# Patient Record
Sex: Male | Born: 1970 | Race: White | Hispanic: No | Marital: Single | Smoking: Never smoker
Health system: Southern US, Community
[De-identification: ages and names within clinical notes are randomized; demographics above are authoritative.]

## PROBLEM LIST (undated history)

## (undated) DIAGNOSIS — G8929 Other chronic pain: Secondary | ICD-10-CM

## (undated) DIAGNOSIS — R519 Other chronic pain: Secondary | ICD-10-CM

## (undated) DIAGNOSIS — G4733 Obstructive sleep apnea (adult) (pediatric): Secondary | ICD-10-CM

## (undated) DIAGNOSIS — A048 Other specified bacterial intestinal infections: Secondary | ICD-10-CM

## (undated) DIAGNOSIS — F79 Unspecified intellectual disabilities: Secondary | ICD-10-CM

## (undated) DIAGNOSIS — G5602 Carpal tunnel syndrome, left upper limb: Secondary | ICD-10-CM

## (undated) DIAGNOSIS — R51 Headache: Secondary | ICD-10-CM

## (undated) DIAGNOSIS — T148XXA Other injury of unspecified body region, initial encounter: Secondary | ICD-10-CM

## (undated) DIAGNOSIS — I10 Essential (primary) hypertension: Secondary | ICD-10-CM

## (undated) HISTORY — DX: Other chronic pain: R51.9

## (undated) HISTORY — PX: CARPAL TUNNEL RELEASE: SHX101

## (undated) HISTORY — DX: Other specified bacterial intestinal infections: A04.8

## (undated) HISTORY — DX: Obstructive sleep apnea (adult) (pediatric): G47.33

## (undated) HISTORY — DX: Carpal tunnel syndrome, left upper limb: G56.02

## (undated) HISTORY — DX: Essential (primary) hypertension: I10

## (undated) HISTORY — DX: Unspecified intellectual disabilities: F79

## (undated) HISTORY — DX: Other chronic pain: G89.29

## (undated) HISTORY — DX: Headache: R51

## (undated) HISTORY — DX: Other injury of unspecified body region, initial encounter: T14.8XXA

---

## 1997-09-30 ENCOUNTER — Emergency Department (HOSPITAL_COMMUNITY): Admission: EM | Admit: 1997-09-30 | Discharge: 1997-09-30 | Payer: Self-pay | Admitting: Emergency Medicine

## 1997-11-22 ENCOUNTER — Encounter: Admission: RE | Admit: 1997-11-22 | Discharge: 1997-11-22 | Payer: Self-pay | Admitting: Family Medicine

## 1998-01-03 ENCOUNTER — Encounter: Admission: RE | Admit: 1998-01-03 | Discharge: 1998-01-03 | Payer: Self-pay | Admitting: Family Medicine

## 1998-02-14 ENCOUNTER — Encounter: Admission: RE | Admit: 1998-02-14 | Discharge: 1998-02-14 | Payer: Self-pay | Admitting: Family Medicine

## 1998-02-28 ENCOUNTER — Encounter: Admission: RE | Admit: 1998-02-28 | Discharge: 1998-02-28 | Payer: Self-pay | Admitting: Family Medicine

## 1998-08-22 ENCOUNTER — Encounter: Admission: RE | Admit: 1998-08-22 | Discharge: 1998-08-22 | Payer: Self-pay | Admitting: Family Medicine

## 1998-09-11 ENCOUNTER — Emergency Department (HOSPITAL_COMMUNITY): Admission: EM | Admit: 1998-09-11 | Discharge: 1998-09-11 | Payer: Self-pay | Admitting: Emergency Medicine

## 1998-09-11 ENCOUNTER — Encounter: Payer: Self-pay | Admitting: Emergency Medicine

## 1998-09-12 ENCOUNTER — Emergency Department (HOSPITAL_COMMUNITY): Admission: EM | Admit: 1998-09-12 | Discharge: 1998-09-12 | Payer: Self-pay | Admitting: Emergency Medicine

## 1998-09-21 ENCOUNTER — Encounter: Admission: RE | Admit: 1998-09-21 | Discharge: 1998-09-21 | Payer: Self-pay | Admitting: Family Medicine

## 1998-10-05 ENCOUNTER — Encounter: Admission: RE | Admit: 1998-10-05 | Discharge: 1998-10-05 | Payer: Self-pay | Admitting: Family Medicine

## 1998-10-15 ENCOUNTER — Encounter: Admission: RE | Admit: 1998-10-15 | Discharge: 1998-10-15 | Payer: Self-pay | Admitting: Family Medicine

## 1998-10-25 ENCOUNTER — Encounter: Admission: RE | Admit: 1998-10-25 | Discharge: 1998-10-25 | Payer: Self-pay | Admitting: Family Medicine

## 1998-11-08 ENCOUNTER — Encounter: Admission: RE | Admit: 1998-11-08 | Discharge: 1998-11-08 | Payer: Self-pay | Admitting: Family Medicine

## 1998-12-10 ENCOUNTER — Encounter: Admission: RE | Admit: 1998-12-10 | Discharge: 1998-12-10 | Payer: Self-pay | Admitting: Family Medicine

## 1999-02-01 ENCOUNTER — Encounter: Admission: RE | Admit: 1999-02-01 | Discharge: 1999-02-01 | Payer: Self-pay | Admitting: Family Medicine

## 1999-02-05 ENCOUNTER — Encounter: Admission: RE | Admit: 1999-02-05 | Discharge: 1999-02-05 | Payer: Self-pay | Admitting: Sports Medicine

## 1999-02-21 ENCOUNTER — Encounter: Admission: RE | Admit: 1999-02-21 | Discharge: 1999-02-21 | Payer: Self-pay | Admitting: Family Medicine

## 1999-03-22 ENCOUNTER — Emergency Department (HOSPITAL_COMMUNITY): Admission: EM | Admit: 1999-03-22 | Discharge: 1999-03-22 | Payer: Self-pay | Admitting: Emergency Medicine

## 1999-09-19 ENCOUNTER — Encounter: Admission: RE | Admit: 1999-09-19 | Discharge: 1999-09-19 | Payer: Self-pay | Admitting: Family Medicine

## 1999-09-25 ENCOUNTER — Encounter: Admission: RE | Admit: 1999-09-25 | Discharge: 1999-09-25 | Payer: Self-pay | Admitting: Family Medicine

## 1999-10-09 ENCOUNTER — Encounter: Admission: RE | Admit: 1999-10-09 | Discharge: 1999-10-09 | Payer: Self-pay | Admitting: Family Medicine

## 2000-01-05 ENCOUNTER — Emergency Department (HOSPITAL_COMMUNITY): Admission: EM | Admit: 2000-01-05 | Discharge: 2000-01-06 | Payer: Self-pay | Admitting: Emergency Medicine

## 2000-01-05 ENCOUNTER — Encounter: Payer: Self-pay | Admitting: Emergency Medicine

## 2000-02-18 HISTORY — PX: APPENDECTOMY: SHX54

## 2000-05-12 ENCOUNTER — Encounter: Admission: RE | Admit: 2000-05-12 | Discharge: 2000-05-12 | Payer: Self-pay | Admitting: Sports Medicine

## 2000-05-30 ENCOUNTER — Emergency Department (HOSPITAL_COMMUNITY): Admission: EM | Admit: 2000-05-30 | Discharge: 2000-05-30 | Payer: Self-pay | Admitting: *Deleted

## 2000-06-11 ENCOUNTER — Emergency Department (HOSPITAL_COMMUNITY): Admission: EM | Admit: 2000-06-11 | Discharge: 2000-06-11 | Payer: Self-pay | Admitting: Emergency Medicine

## 2000-06-11 ENCOUNTER — Encounter: Payer: Self-pay | Admitting: *Deleted

## 2000-06-12 ENCOUNTER — Emergency Department (HOSPITAL_COMMUNITY): Admission: EM | Admit: 2000-06-12 | Discharge: 2000-06-12 | Payer: Self-pay | Admitting: Internal Medicine

## 2000-06-15 ENCOUNTER — Encounter: Admission: RE | Admit: 2000-06-15 | Discharge: 2000-06-15 | Payer: Self-pay | Admitting: Family Medicine

## 2000-06-24 ENCOUNTER — Encounter: Admission: RE | Admit: 2000-06-24 | Discharge: 2000-06-24 | Payer: Self-pay | Admitting: Family Medicine

## 2000-07-24 ENCOUNTER — Encounter: Admission: RE | Admit: 2000-07-24 | Discharge: 2000-07-24 | Payer: Self-pay | Admitting: Family Medicine

## 2000-08-26 ENCOUNTER — Emergency Department (HOSPITAL_COMMUNITY): Admission: EM | Admit: 2000-08-26 | Discharge: 2000-08-26 | Payer: Self-pay

## 2000-10-12 ENCOUNTER — Emergency Department (HOSPITAL_COMMUNITY): Admission: EM | Admit: 2000-10-12 | Discharge: 2000-10-12 | Payer: Self-pay | Admitting: Emergency Medicine

## 2000-10-12 ENCOUNTER — Encounter: Payer: Self-pay | Admitting: Emergency Medicine

## 2000-10-13 ENCOUNTER — Emergency Department (HOSPITAL_COMMUNITY): Admission: EM | Admit: 2000-10-13 | Discharge: 2000-10-13 | Payer: Self-pay | Admitting: Emergency Medicine

## 2000-10-15 ENCOUNTER — Encounter: Payer: Self-pay | Admitting: *Deleted

## 2000-10-15 ENCOUNTER — Encounter: Admission: RE | Admit: 2000-10-15 | Discharge: 2000-10-15 | Payer: Self-pay | Admitting: *Deleted

## 2000-10-15 ENCOUNTER — Encounter: Admission: RE | Admit: 2000-10-15 | Discharge: 2000-10-15 | Payer: Self-pay | Admitting: Sports Medicine

## 2000-10-22 ENCOUNTER — Encounter: Admission: RE | Admit: 2000-10-22 | Discharge: 2000-10-22 | Payer: Self-pay | Admitting: Family Medicine

## 2000-11-05 ENCOUNTER — Encounter: Admission: RE | Admit: 2000-11-05 | Discharge: 2000-11-05 | Payer: Self-pay | Admitting: Family Medicine

## 2000-11-10 ENCOUNTER — Encounter: Payer: Self-pay | Admitting: *Deleted

## 2000-11-10 ENCOUNTER — Encounter: Admission: RE | Admit: 2000-11-10 | Discharge: 2000-11-10 | Payer: Self-pay | Admitting: *Deleted

## 2000-11-12 ENCOUNTER — Encounter: Admission: RE | Admit: 2000-11-12 | Discharge: 2000-11-12 | Payer: Self-pay | Admitting: Sports Medicine

## 2000-11-26 ENCOUNTER — Encounter: Admission: RE | Admit: 2000-11-26 | Discharge: 2000-11-26 | Payer: Self-pay | Admitting: Family Medicine

## 2001-01-19 ENCOUNTER — Emergency Department (HOSPITAL_COMMUNITY): Admission: EM | Admit: 2001-01-19 | Discharge: 2001-01-19 | Payer: Self-pay | Admitting: Emergency Medicine

## 2001-02-17 HISTORY — PX: CERVICAL DISC SURGERY: SHX588

## 2001-02-17 HISTORY — PX: ESOPHAGOGASTRODUODENOSCOPY: SHX1529

## 2001-03-08 ENCOUNTER — Emergency Department (HOSPITAL_COMMUNITY): Admission: EM | Admit: 2001-03-08 | Discharge: 2001-03-08 | Payer: Self-pay | Admitting: *Deleted

## 2001-04-01 ENCOUNTER — Encounter: Admission: RE | Admit: 2001-04-01 | Discharge: 2001-04-14 | Payer: Self-pay | Admitting: Sports Medicine

## 2001-04-29 ENCOUNTER — Emergency Department (HOSPITAL_COMMUNITY): Admission: EM | Admit: 2001-04-29 | Discharge: 2001-04-29 | Payer: Self-pay | Admitting: Emergency Medicine

## 2001-04-29 ENCOUNTER — Encounter: Payer: Self-pay | Admitting: Emergency Medicine

## 2001-05-11 ENCOUNTER — Emergency Department (HOSPITAL_COMMUNITY): Admission: EM | Admit: 2001-05-11 | Discharge: 2001-05-12 | Payer: Self-pay | Admitting: Emergency Medicine

## 2001-05-20 ENCOUNTER — Emergency Department (HOSPITAL_COMMUNITY): Admission: EM | Admit: 2001-05-20 | Discharge: 2001-05-21 | Payer: Self-pay | Admitting: *Deleted

## 2001-06-15 ENCOUNTER — Encounter: Admission: RE | Admit: 2001-06-15 | Discharge: 2001-06-15 | Payer: Self-pay | Admitting: Family Medicine

## 2001-06-17 ENCOUNTER — Emergency Department (HOSPITAL_COMMUNITY): Admission: EM | Admit: 2001-06-17 | Discharge: 2001-06-17 | Payer: Self-pay | Admitting: Emergency Medicine

## 2001-06-21 ENCOUNTER — Encounter: Admission: RE | Admit: 2001-06-21 | Discharge: 2001-06-21 | Payer: Self-pay | Admitting: Family Medicine

## 2001-06-30 ENCOUNTER — Encounter: Admission: RE | Admit: 2001-06-30 | Discharge: 2001-06-30 | Payer: Self-pay | Admitting: Family Medicine

## 2001-09-09 ENCOUNTER — Encounter: Payer: Self-pay | Admitting: Neurosurgery

## 2001-09-14 ENCOUNTER — Inpatient Hospital Stay (HOSPITAL_COMMUNITY): Admission: RE | Admit: 2001-09-14 | Discharge: 2001-09-16 | Payer: Self-pay | Admitting: Neurosurgery

## 2001-09-14 ENCOUNTER — Encounter: Payer: Self-pay | Admitting: Neurosurgery

## 2001-10-07 ENCOUNTER — Emergency Department (HOSPITAL_COMMUNITY): Admission: EM | Admit: 2001-10-07 | Discharge: 2001-10-07 | Payer: Self-pay | Admitting: Emergency Medicine

## 2001-10-18 DIAGNOSIS — A048 Other specified bacterial intestinal infections: Secondary | ICD-10-CM

## 2001-10-18 HISTORY — DX: Other specified bacterial intestinal infections: A04.8

## 2001-10-25 ENCOUNTER — Encounter: Payer: Self-pay | Admitting: Neurosurgery

## 2001-10-25 ENCOUNTER — Encounter: Admission: RE | Admit: 2001-10-25 | Discharge: 2001-10-25 | Payer: Self-pay | Admitting: Neurosurgery

## 2001-11-03 ENCOUNTER — Encounter: Admission: RE | Admit: 2001-11-03 | Discharge: 2001-11-03 | Payer: Self-pay | Admitting: Family Medicine

## 2001-12-24 ENCOUNTER — Encounter: Payer: Self-pay | Admitting: Neurosurgery

## 2001-12-24 ENCOUNTER — Encounter: Admission: RE | Admit: 2001-12-24 | Discharge: 2001-12-24 | Payer: Self-pay | Admitting: Neurosurgery

## 2002-02-15 ENCOUNTER — Emergency Department (HOSPITAL_COMMUNITY): Admission: EM | Admit: 2002-02-15 | Discharge: 2002-02-15 | Payer: Self-pay | Admitting: *Deleted

## 2002-04-14 ENCOUNTER — Emergency Department (HOSPITAL_COMMUNITY): Admission: EM | Admit: 2002-04-14 | Discharge: 2002-04-14 | Payer: Self-pay | Admitting: Emergency Medicine

## 2002-04-14 ENCOUNTER — Encounter: Payer: Self-pay | Admitting: Emergency Medicine

## 2002-07-06 ENCOUNTER — Encounter: Admission: RE | Admit: 2002-07-06 | Discharge: 2002-07-06 | Payer: Self-pay | Admitting: Family Medicine

## 2002-07-13 ENCOUNTER — Encounter: Admission: RE | Admit: 2002-07-13 | Discharge: 2002-07-13 | Payer: Self-pay | Admitting: Family Medicine

## 2002-11-07 ENCOUNTER — Emergency Department (HOSPITAL_COMMUNITY): Admission: EM | Admit: 2002-11-07 | Discharge: 2002-11-07 | Payer: Self-pay | Admitting: Emergency Medicine

## 2002-11-14 ENCOUNTER — Ambulatory Visit (HOSPITAL_COMMUNITY): Admission: RE | Admit: 2002-11-14 | Discharge: 2002-11-14 | Payer: Self-pay | Admitting: Sports Medicine

## 2002-11-14 ENCOUNTER — Encounter: Payer: Self-pay | Admitting: Sports Medicine

## 2002-11-29 ENCOUNTER — Encounter: Admission: RE | Admit: 2002-11-29 | Discharge: 2002-11-29 | Payer: Self-pay | Admitting: Family Medicine

## 2002-12-09 ENCOUNTER — Encounter: Admission: RE | Admit: 2002-12-09 | Discharge: 2002-12-09 | Payer: Self-pay | Admitting: Family Medicine

## 2002-12-19 HISTORY — PX: LUMBAR LAMINECTOMY: SHX95

## 2003-01-16 ENCOUNTER — Inpatient Hospital Stay (HOSPITAL_COMMUNITY): Admission: RE | Admit: 2003-01-16 | Discharge: 2003-01-20 | Payer: Self-pay | Admitting: Neurosurgery

## 2003-02-16 ENCOUNTER — Encounter: Admission: RE | Admit: 2003-02-16 | Discharge: 2003-03-09 | Payer: Self-pay | Admitting: Neurosurgery

## 2003-03-16 ENCOUNTER — Encounter: Admission: RE | Admit: 2003-03-16 | Discharge: 2003-03-16 | Payer: Self-pay | Admitting: Neurosurgery

## 2003-04-21 ENCOUNTER — Encounter: Admission: RE | Admit: 2003-04-21 | Discharge: 2003-04-21 | Payer: Self-pay | Admitting: Family Medicine

## 2003-05-25 ENCOUNTER — Encounter: Admission: RE | Admit: 2003-05-25 | Discharge: 2003-05-25 | Payer: Self-pay | Admitting: Neurosurgery

## 2003-05-31 ENCOUNTER — Encounter: Admission: RE | Admit: 2003-05-31 | Discharge: 2003-05-31 | Payer: Self-pay | Admitting: Family Medicine

## 2003-06-28 ENCOUNTER — Ambulatory Visit (HOSPITAL_COMMUNITY): Admission: RE | Admit: 2003-06-28 | Discharge: 2003-06-28 | Payer: Self-pay | Admitting: Family Medicine

## 2003-06-28 ENCOUNTER — Encounter: Admission: RE | Admit: 2003-06-28 | Discharge: 2003-06-28 | Payer: Self-pay | Admitting: Family Medicine

## 2003-06-30 ENCOUNTER — Emergency Department (HOSPITAL_COMMUNITY): Admission: EM | Admit: 2003-06-30 | Discharge: 2003-06-30 | Payer: Self-pay | Admitting: Emergency Medicine

## 2003-07-25 ENCOUNTER — Encounter: Admission: RE | Admit: 2003-07-25 | Discharge: 2003-07-25 | Payer: Self-pay | Admitting: Neurosurgery

## 2003-07-29 ENCOUNTER — Ambulatory Visit (HOSPITAL_COMMUNITY): Admission: RE | Admit: 2003-07-29 | Discharge: 2003-07-29 | Payer: Self-pay | Admitting: Neurosurgery

## 2003-08-14 ENCOUNTER — Encounter: Admission: RE | Admit: 2003-08-14 | Discharge: 2003-11-12 | Payer: Self-pay | Admitting: Neurosurgery

## 2003-10-12 ENCOUNTER — Encounter: Admission: RE | Admit: 2003-10-12 | Discharge: 2003-10-12 | Payer: Self-pay | Admitting: Neurosurgery

## 2003-10-13 ENCOUNTER — Encounter: Admission: RE | Admit: 2003-10-13 | Discharge: 2003-10-13 | Payer: Self-pay | Admitting: Neurosurgery

## 2003-10-20 ENCOUNTER — Ambulatory Visit: Payer: Self-pay | Admitting: Family Medicine

## 2004-01-04 ENCOUNTER — Ambulatory Visit: Payer: Self-pay | Admitting: Family Medicine

## 2004-01-09 ENCOUNTER — Ambulatory Visit: Payer: Self-pay | Admitting: Family Medicine

## 2004-01-09 ENCOUNTER — Encounter: Admission: RE | Admit: 2004-01-09 | Discharge: 2004-01-09 | Payer: Self-pay | Admitting: Sports Medicine

## 2004-01-10 ENCOUNTER — Ambulatory Visit: Payer: Self-pay | Admitting: Family Medicine

## 2004-01-22 ENCOUNTER — Ambulatory Visit: Payer: Self-pay | Admitting: Family Medicine

## 2004-01-29 ENCOUNTER — Ambulatory Visit: Payer: Self-pay | Admitting: Family Medicine

## 2004-02-02 ENCOUNTER — Ambulatory Visit: Payer: Self-pay | Admitting: Family Medicine

## 2004-02-20 ENCOUNTER — Ambulatory Visit: Payer: Self-pay | Admitting: Family Medicine

## 2004-02-28 ENCOUNTER — Emergency Department (HOSPITAL_COMMUNITY): Admission: EM | Admit: 2004-02-28 | Discharge: 2004-02-28 | Payer: Self-pay | Admitting: Emergency Medicine

## 2004-04-11 ENCOUNTER — Emergency Department (HOSPITAL_COMMUNITY): Admission: EM | Admit: 2004-04-11 | Discharge: 2004-04-11 | Payer: Self-pay | Admitting: *Deleted

## 2004-04-12 ENCOUNTER — Inpatient Hospital Stay (HOSPITAL_COMMUNITY): Admission: EM | Admit: 2004-04-12 | Discharge: 2004-04-14 | Payer: Self-pay | Admitting: Emergency Medicine

## 2004-04-12 ENCOUNTER — Ambulatory Visit: Payer: Self-pay | Admitting: Family Medicine

## 2004-04-18 ENCOUNTER — Ambulatory Visit: Payer: Self-pay | Admitting: Family Medicine

## 2004-05-14 ENCOUNTER — Encounter: Admission: RE | Admit: 2004-05-14 | Discharge: 2004-05-14 | Payer: Self-pay | Admitting: Neurosurgery

## 2004-05-14 ENCOUNTER — Ambulatory Visit (HOSPITAL_COMMUNITY): Admission: RE | Admit: 2004-05-14 | Discharge: 2004-05-14 | Payer: Self-pay | Admitting: Sports Medicine

## 2004-05-15 ENCOUNTER — Encounter: Admission: RE | Admit: 2004-05-15 | Discharge: 2004-05-15 | Payer: Self-pay | Admitting: Neurosurgery

## 2004-06-20 ENCOUNTER — Ambulatory Visit: Payer: Self-pay | Admitting: Family Medicine

## 2004-07-01 ENCOUNTER — Ambulatory Visit: Payer: Self-pay | Admitting: Family Medicine

## 2004-07-05 ENCOUNTER — Ambulatory Visit: Payer: Self-pay | Admitting: Family Medicine

## 2004-07-08 ENCOUNTER — Ambulatory Visit: Payer: Self-pay | Admitting: Family Medicine

## 2004-07-10 ENCOUNTER — Ambulatory Visit: Payer: Self-pay | Admitting: Family Medicine

## 2004-07-17 ENCOUNTER — Ambulatory Visit: Payer: Self-pay | Admitting: Family Medicine

## 2004-08-23 ENCOUNTER — Ambulatory Visit: Payer: Self-pay | Admitting: Family Medicine

## 2004-09-12 ENCOUNTER — Encounter: Admission: RE | Admit: 2004-09-12 | Discharge: 2004-09-12 | Payer: Self-pay | Admitting: Neurosurgery

## 2004-09-25 ENCOUNTER — Ambulatory Visit: Payer: Self-pay | Admitting: Family Medicine

## 2004-10-16 ENCOUNTER — Ambulatory Visit: Payer: Self-pay | Admitting: Family Medicine

## 2004-10-24 ENCOUNTER — Ambulatory Visit: Payer: Self-pay | Admitting: Family Medicine

## 2005-05-14 ENCOUNTER — Emergency Department (HOSPITAL_COMMUNITY): Admission: EM | Admit: 2005-05-14 | Discharge: 2005-05-14 | Payer: Self-pay | Admitting: Emergency Medicine

## 2005-05-17 ENCOUNTER — Encounter: Admission: RE | Admit: 2005-05-17 | Discharge: 2005-05-17 | Payer: Self-pay | Admitting: Otolaryngology

## 2005-05-23 ENCOUNTER — Ambulatory Visit: Payer: Self-pay | Admitting: Family Medicine

## 2005-05-31 ENCOUNTER — Emergency Department (HOSPITAL_COMMUNITY): Admission: EM | Admit: 2005-05-31 | Discharge: 2005-05-31 | Payer: Self-pay | Admitting: Emergency Medicine

## 2005-06-18 ENCOUNTER — Ambulatory Visit: Payer: Self-pay | Admitting: Family Medicine

## 2005-08-22 ENCOUNTER — Ambulatory Visit: Payer: Self-pay | Admitting: Family Medicine

## 2005-08-29 ENCOUNTER — Ambulatory Visit: Payer: Self-pay | Admitting: Family Medicine

## 2005-09-19 ENCOUNTER — Ambulatory Visit: Payer: Self-pay | Admitting: Sports Medicine

## 2005-10-01 ENCOUNTER — Ambulatory Visit: Payer: Self-pay | Admitting: Family Medicine

## 2005-10-25 ENCOUNTER — Encounter: Admission: RE | Admit: 2005-10-25 | Discharge: 2005-10-25 | Payer: Self-pay | Admitting: Neurosurgery

## 2005-11-06 ENCOUNTER — Ambulatory Visit: Payer: Self-pay | Admitting: Sports Medicine

## 2005-11-07 ENCOUNTER — Emergency Department (HOSPITAL_COMMUNITY): Admission: EM | Admit: 2005-11-07 | Discharge: 2005-11-07 | Payer: Self-pay | Admitting: Emergency Medicine

## 2005-12-02 ENCOUNTER — Ambulatory Visit: Payer: Self-pay | Admitting: Family Medicine

## 2005-12-16 ENCOUNTER — Ambulatory Visit: Payer: Self-pay | Admitting: Family Medicine

## 2006-01-21 ENCOUNTER — Ambulatory Visit: Payer: Self-pay | Admitting: Family Medicine

## 2006-02-11 ENCOUNTER — Encounter
Admission: RE | Admit: 2006-02-11 | Discharge: 2006-02-11 | Payer: Self-pay | Admitting: Physical Medicine and Rehabilitation

## 2006-03-06 ENCOUNTER — Ambulatory Visit: Payer: Self-pay | Admitting: Family Medicine

## 2006-03-26 ENCOUNTER — Ambulatory Visit: Payer: Self-pay | Admitting: Family Medicine

## 2006-03-26 ENCOUNTER — Encounter (INDEPENDENT_AMBULATORY_CARE_PROVIDER_SITE_OTHER): Payer: Self-pay | Admitting: Family Medicine

## 2006-03-26 LAB — CONVERTED CEMR LAB
Calcium: 9.9 mg/dL (ref 8.4–10.5)
Potassium: 4.2 meq/L (ref 3.5–5.3)

## 2006-03-27 ENCOUNTER — Ambulatory Visit (HOSPITAL_COMMUNITY): Admission: RE | Admit: 2006-03-27 | Discharge: 2006-03-27 | Payer: Self-pay | Admitting: Neurosurgery

## 2006-04-04 ENCOUNTER — Emergency Department (HOSPITAL_COMMUNITY): Admission: EM | Admit: 2006-04-04 | Discharge: 2006-04-04 | Payer: Self-pay | Admitting: Emergency Medicine

## 2006-04-06 ENCOUNTER — Ambulatory Visit: Payer: Self-pay | Admitting: Family Medicine

## 2006-04-16 DIAGNOSIS — L408 Other psoriasis: Secondary | ICD-10-CM | POA: Insufficient documentation

## 2006-04-16 DIAGNOSIS — F79 Unspecified intellectual disabilities: Secondary | ICD-10-CM | POA: Insufficient documentation

## 2006-04-16 DIAGNOSIS — I10 Essential (primary) hypertension: Secondary | ICD-10-CM | POA: Insufficient documentation

## 2006-04-16 DIAGNOSIS — J309 Allergic rhinitis, unspecified: Secondary | ICD-10-CM | POA: Insufficient documentation

## 2006-04-16 DIAGNOSIS — G43909 Migraine, unspecified, not intractable, without status migrainosus: Secondary | ICD-10-CM | POA: Insufficient documentation

## 2006-05-18 ENCOUNTER — Telehealth (INDEPENDENT_AMBULATORY_CARE_PROVIDER_SITE_OTHER): Payer: Self-pay | Admitting: *Deleted

## 2006-05-19 ENCOUNTER — Telehealth: Payer: Self-pay | Admitting: *Deleted

## 2006-05-20 ENCOUNTER — Ambulatory Visit: Payer: Self-pay | Admitting: Family Medicine

## 2006-05-20 ENCOUNTER — Ambulatory Visit (HOSPITAL_COMMUNITY): Admission: RE | Admit: 2006-05-20 | Discharge: 2006-05-20 | Payer: Self-pay | Admitting: Family Medicine

## 2006-05-26 ENCOUNTER — Telehealth: Payer: Self-pay | Admitting: *Deleted

## 2006-05-28 ENCOUNTER — Telehealth (INDEPENDENT_AMBULATORY_CARE_PROVIDER_SITE_OTHER): Payer: Self-pay | Admitting: *Deleted

## 2006-07-07 ENCOUNTER — Telehealth: Payer: Self-pay | Admitting: *Deleted

## 2006-07-17 ENCOUNTER — Encounter: Payer: Self-pay | Admitting: *Deleted

## 2006-07-20 ENCOUNTER — Telehealth: Payer: Self-pay | Admitting: *Deleted

## 2006-07-23 ENCOUNTER — Encounter (INDEPENDENT_AMBULATORY_CARE_PROVIDER_SITE_OTHER): Payer: Self-pay | Admitting: Family Medicine

## 2006-07-23 ENCOUNTER — Ambulatory Visit: Payer: Self-pay | Admitting: Family Medicine

## 2006-07-23 DIAGNOSIS — H00019 Hordeolum externum unspecified eye, unspecified eyelid: Secondary | ICD-10-CM | POA: Insufficient documentation

## 2006-08-12 ENCOUNTER — Ambulatory Visit: Payer: Self-pay | Admitting: Family Medicine

## 2006-08-12 DIAGNOSIS — R42 Dizziness and giddiness: Secondary | ICD-10-CM | POA: Insufficient documentation

## 2006-09-01 ENCOUNTER — Telehealth: Payer: Self-pay | Admitting: *Deleted

## 2006-09-02 ENCOUNTER — Ambulatory Visit: Payer: Self-pay | Admitting: Family Medicine

## 2006-09-02 DIAGNOSIS — N508 Other specified disorders of male genital organs: Secondary | ICD-10-CM | POA: Insufficient documentation

## 2006-09-09 ENCOUNTER — Telehealth (INDEPENDENT_AMBULATORY_CARE_PROVIDER_SITE_OTHER): Payer: Self-pay | Admitting: Family Medicine

## 2006-09-09 ENCOUNTER — Ambulatory Visit: Payer: Self-pay | Admitting: Family Medicine

## 2006-09-10 ENCOUNTER — Telehealth: Payer: Self-pay | Admitting: *Deleted

## 2006-09-14 ENCOUNTER — Telehealth: Payer: Self-pay | Admitting: *Deleted

## 2006-10-01 ENCOUNTER — Telehealth: Payer: Self-pay | Admitting: Family Medicine

## 2006-10-26 ENCOUNTER — Ambulatory Visit: Payer: Self-pay | Admitting: Family Medicine

## 2006-11-03 ENCOUNTER — Ambulatory Visit: Payer: Self-pay | Admitting: Family Medicine

## 2006-11-03 ENCOUNTER — Encounter (INDEPENDENT_AMBULATORY_CARE_PROVIDER_SITE_OTHER): Payer: Self-pay | Admitting: Family Medicine

## 2006-11-03 LAB — CONVERTED CEMR LAB
BUN: 9 mg/dL (ref 6–23)
Calcium: 9.1 mg/dL (ref 8.4–10.5)
Chloride: 104 meq/L (ref 96–112)
Glucose, Bld: 87 mg/dL (ref 70–99)

## 2006-11-05 ENCOUNTER — Encounter (INDEPENDENT_AMBULATORY_CARE_PROVIDER_SITE_OTHER): Payer: Self-pay | Admitting: Family Medicine

## 2006-11-05 ENCOUNTER — Encounter: Admission: RE | Admit: 2006-11-05 | Discharge: 2006-11-05 | Payer: Self-pay | Admitting: Neurosurgery

## 2006-11-13 ENCOUNTER — Telehealth (INDEPENDENT_AMBULATORY_CARE_PROVIDER_SITE_OTHER): Payer: Self-pay | Admitting: Family Medicine

## 2006-11-13 ENCOUNTER — Telehealth: Payer: Self-pay | Admitting: *Deleted

## 2006-11-17 ENCOUNTER — Telehealth: Payer: Self-pay | Admitting: *Deleted

## 2006-11-30 ENCOUNTER — Telehealth: Payer: Self-pay | Admitting: *Deleted

## 2006-12-15 ENCOUNTER — Encounter: Admission: RE | Admit: 2006-12-15 | Discharge: 2006-12-15 | Payer: Self-pay | Admitting: Neurosurgery

## 2006-12-23 ENCOUNTER — Encounter (INDEPENDENT_AMBULATORY_CARE_PROVIDER_SITE_OTHER): Payer: Self-pay | Admitting: *Deleted

## 2007-02-15 ENCOUNTER — Encounter (INDEPENDENT_AMBULATORY_CARE_PROVIDER_SITE_OTHER): Payer: Self-pay | Admitting: Family Medicine

## 2007-02-25 ENCOUNTER — Inpatient Hospital Stay (HOSPITAL_COMMUNITY): Admission: EM | Admit: 2007-02-25 | Discharge: 2007-02-28 | Payer: Self-pay | Admitting: Emergency Medicine

## 2007-02-25 ENCOUNTER — Ambulatory Visit: Payer: Self-pay | Admitting: Family Medicine

## 2007-02-26 LAB — CONVERTED CEMR LAB: HDL: 28 mg/dL

## 2007-02-27 ENCOUNTER — Encounter: Payer: Self-pay | Admitting: Family Medicine

## 2007-02-28 ENCOUNTER — Encounter: Payer: Self-pay | Admitting: *Deleted

## 2007-03-01 ENCOUNTER — Telehealth: Payer: Self-pay | Admitting: Family Medicine

## 2007-03-01 ENCOUNTER — Telehealth: Payer: Self-pay | Admitting: *Deleted

## 2007-03-02 ENCOUNTER — Telehealth (INDEPENDENT_AMBULATORY_CARE_PROVIDER_SITE_OTHER): Payer: Self-pay | Admitting: Family Medicine

## 2007-03-03 ENCOUNTER — Telehealth: Payer: Self-pay | Admitting: *Deleted

## 2007-03-03 ENCOUNTER — Telehealth (INDEPENDENT_AMBULATORY_CARE_PROVIDER_SITE_OTHER): Payer: Self-pay | Admitting: Family Medicine

## 2007-03-03 ENCOUNTER — Emergency Department (HOSPITAL_COMMUNITY): Admission: EM | Admit: 2007-03-03 | Discharge: 2007-03-03 | Payer: Self-pay | Admitting: Emergency Medicine

## 2007-03-10 ENCOUNTER — Ambulatory Visit: Payer: Self-pay | Admitting: Cardiology

## 2007-03-11 ENCOUNTER — Ambulatory Visit: Payer: Self-pay | Admitting: Family Medicine

## 2007-03-22 ENCOUNTER — Encounter (INDEPENDENT_AMBULATORY_CARE_PROVIDER_SITE_OTHER): Payer: Self-pay | Admitting: Family Medicine

## 2007-03-22 ENCOUNTER — Ambulatory Visit: Payer: Self-pay

## 2007-03-23 ENCOUNTER — Ambulatory Visit: Payer: Self-pay

## 2007-03-31 ENCOUNTER — Ambulatory Visit: Payer: Self-pay | Admitting: Cardiology

## 2007-03-31 LAB — CONVERTED CEMR LAB
Basophils Relative: 3.8 % — ABNORMAL HIGH (ref 0.0–1.0)
Chloride: 103 meq/L (ref 96–112)
Creatinine, Ser: 0.9 mg/dL (ref 0.4–1.5)
Eosinophils Absolute: 0.2 10*3/uL (ref 0.0–0.6)
Eosinophils Relative: 1.9 % (ref 0.0–5.0)
GFR calc Af Amer: 123 mL/min
GFR calc non Af Amer: 101 mL/min
HCT: 43.3 % (ref 39.0–52.0)
INR: 0.9 (ref 0.8–1.0)
Lymphocytes Relative: 20 % (ref 12.0–46.0)
MCHC: 33.9 g/dL (ref 30.0–36.0)
MCV: 94.2 fL (ref 78.0–100.0)
Prothrombin Time: 11.3 s (ref 10.9–13.3)
RDW: 13.1 % (ref 11.5–14.6)
aPTT: 29.7 s (ref 21.7–29.8)

## 2007-04-05 ENCOUNTER — Telehealth (INDEPENDENT_AMBULATORY_CARE_PROVIDER_SITE_OTHER): Payer: Self-pay | Admitting: Family Medicine

## 2007-04-08 ENCOUNTER — Ambulatory Visit: Payer: Self-pay | Admitting: Cardiology

## 2007-04-08 ENCOUNTER — Inpatient Hospital Stay (HOSPITAL_BASED_OUTPATIENT_CLINIC_OR_DEPARTMENT_OTHER): Admission: RE | Admit: 2007-04-08 | Discharge: 2007-04-08 | Payer: Self-pay | Admitting: Cardiology

## 2007-04-15 ENCOUNTER — Ambulatory Visit: Payer: Self-pay | Admitting: Family Medicine

## 2007-04-21 ENCOUNTER — Ambulatory Visit: Payer: Self-pay | Admitting: Cardiology

## 2007-04-25 ENCOUNTER — Telehealth (INDEPENDENT_AMBULATORY_CARE_PROVIDER_SITE_OTHER): Payer: Self-pay | Admitting: Family Medicine

## 2007-04-26 ENCOUNTER — Telehealth: Payer: Self-pay | Admitting: *Deleted

## 2007-04-27 ENCOUNTER — Encounter: Payer: Self-pay | Admitting: *Deleted

## 2007-05-07 ENCOUNTER — Telehealth: Payer: Self-pay | Admitting: *Deleted

## 2007-05-17 ENCOUNTER — Telehealth (INDEPENDENT_AMBULATORY_CARE_PROVIDER_SITE_OTHER): Payer: Self-pay | Admitting: Family Medicine

## 2007-05-17 ENCOUNTER — Ambulatory Visit: Payer: Self-pay | Admitting: Cardiology

## 2007-05-23 ENCOUNTER — Telehealth (INDEPENDENT_AMBULATORY_CARE_PROVIDER_SITE_OTHER): Payer: Self-pay | Admitting: Family Medicine

## 2007-05-24 ENCOUNTER — Encounter (INDEPENDENT_AMBULATORY_CARE_PROVIDER_SITE_OTHER): Payer: Self-pay | Admitting: Family Medicine

## 2007-05-26 ENCOUNTER — Ambulatory Visit: Payer: Self-pay | Admitting: Family Medicine

## 2007-05-26 DIAGNOSIS — F341 Dysthymic disorder: Secondary | ICD-10-CM | POA: Insufficient documentation

## 2007-06-07 ENCOUNTER — Telehealth (INDEPENDENT_AMBULATORY_CARE_PROVIDER_SITE_OTHER): Payer: Self-pay | Admitting: Family Medicine

## 2007-06-14 ENCOUNTER — Telehealth (INDEPENDENT_AMBULATORY_CARE_PROVIDER_SITE_OTHER): Payer: Self-pay | Admitting: Family Medicine

## 2007-06-24 ENCOUNTER — Encounter: Payer: Self-pay | Admitting: *Deleted

## 2007-07-05 ENCOUNTER — Telehealth: Payer: Self-pay | Admitting: *Deleted

## 2007-07-20 ENCOUNTER — Ambulatory Visit: Payer: Self-pay | Admitting: Family Medicine

## 2007-09-15 ENCOUNTER — Ambulatory Visit: Payer: Self-pay | Admitting: Family Medicine

## 2007-09-15 DIAGNOSIS — M722 Plantar fascial fibromatosis: Secondary | ICD-10-CM | POA: Insufficient documentation

## 2007-10-12 ENCOUNTER — Ambulatory Visit: Payer: Self-pay | Admitting: Family Medicine

## 2007-10-12 DIAGNOSIS — E789 Disorder of lipoprotein metabolism, unspecified: Secondary | ICD-10-CM | POA: Insufficient documentation

## 2007-10-16 ENCOUNTER — Telehealth (INDEPENDENT_AMBULATORY_CARE_PROVIDER_SITE_OTHER): Payer: Self-pay | Admitting: Family Medicine

## 2007-10-21 ENCOUNTER — Ambulatory Visit: Payer: Self-pay | Admitting: Family Medicine

## 2007-10-21 ENCOUNTER — Encounter (INDEPENDENT_AMBULATORY_CARE_PROVIDER_SITE_OTHER): Payer: Self-pay | Admitting: Family Medicine

## 2007-10-21 LAB — CONVERTED CEMR LAB
Total CHOL/HDL Ratio: 5.1
Triglycerides: 154 mg/dL — ABNORMAL HIGH (ref <150)
VLDL: 31 mg/dL (ref 0–40)

## 2007-10-22 ENCOUNTER — Encounter (INDEPENDENT_AMBULATORY_CARE_PROVIDER_SITE_OTHER): Payer: Self-pay | Admitting: *Deleted

## 2007-11-11 ENCOUNTER — Ambulatory Visit: Payer: Self-pay | Admitting: Family Medicine

## 2007-11-14 ENCOUNTER — Emergency Department (HOSPITAL_COMMUNITY): Admission: EM | Admit: 2007-11-14 | Discharge: 2007-11-14 | Payer: Self-pay | Admitting: Emergency Medicine

## 2007-11-29 ENCOUNTER — Telehealth (INDEPENDENT_AMBULATORY_CARE_PROVIDER_SITE_OTHER): Payer: Self-pay | Admitting: *Deleted

## 2007-12-07 ENCOUNTER — Ambulatory Visit: Payer: Self-pay | Admitting: Family Medicine

## 2007-12-09 ENCOUNTER — Telehealth: Payer: Self-pay | Admitting: *Deleted

## 2007-12-20 ENCOUNTER — Telehealth (INDEPENDENT_AMBULATORY_CARE_PROVIDER_SITE_OTHER): Payer: Self-pay | Admitting: *Deleted

## 2007-12-21 ENCOUNTER — Ambulatory Visit (HOSPITAL_BASED_OUTPATIENT_CLINIC_OR_DEPARTMENT_OTHER): Admission: RE | Admit: 2007-12-21 | Discharge: 2007-12-21 | Payer: Self-pay | Admitting: Family Medicine

## 2007-12-21 ENCOUNTER — Encounter (INDEPENDENT_AMBULATORY_CARE_PROVIDER_SITE_OTHER): Payer: Self-pay | Admitting: Family Medicine

## 2007-12-23 ENCOUNTER — Telehealth: Payer: Self-pay | Admitting: *Deleted

## 2007-12-24 ENCOUNTER — Encounter: Admission: RE | Admit: 2007-12-24 | Discharge: 2007-12-24 | Payer: Self-pay | Admitting: Sports Medicine

## 2007-12-25 ENCOUNTER — Ambulatory Visit: Payer: Self-pay | Admitting: Internal Medicine

## 2007-12-30 ENCOUNTER — Ambulatory Visit: Payer: Self-pay | Admitting: Family Medicine

## 2008-01-06 DIAGNOSIS — G4730 Sleep apnea, unspecified: Secondary | ICD-10-CM | POA: Insufficient documentation

## 2008-01-17 ENCOUNTER — Ambulatory Visit (HOSPITAL_BASED_OUTPATIENT_CLINIC_OR_DEPARTMENT_OTHER): Admission: RE | Admit: 2008-01-17 | Discharge: 2008-01-17 | Payer: Self-pay | Admitting: Family Medicine

## 2008-01-17 ENCOUNTER — Encounter (INDEPENDENT_AMBULATORY_CARE_PROVIDER_SITE_OTHER): Payer: Self-pay | Admitting: Family Medicine

## 2008-01-21 ENCOUNTER — Encounter: Payer: Self-pay | Admitting: *Deleted

## 2008-01-21 ENCOUNTER — Ambulatory Visit: Payer: Self-pay | Admitting: Family Medicine

## 2008-01-31 ENCOUNTER — Telehealth: Payer: Self-pay | Admitting: *Deleted

## 2008-02-04 IMAGING — CR DG CHEST 2V
2 series · 2 of 2 positions shown · non-contrast
Comparison: 02/26/07.

CLINICAL DATA: Chest pain.
 CHEST - 2 VIEW:

[w chest pa *]
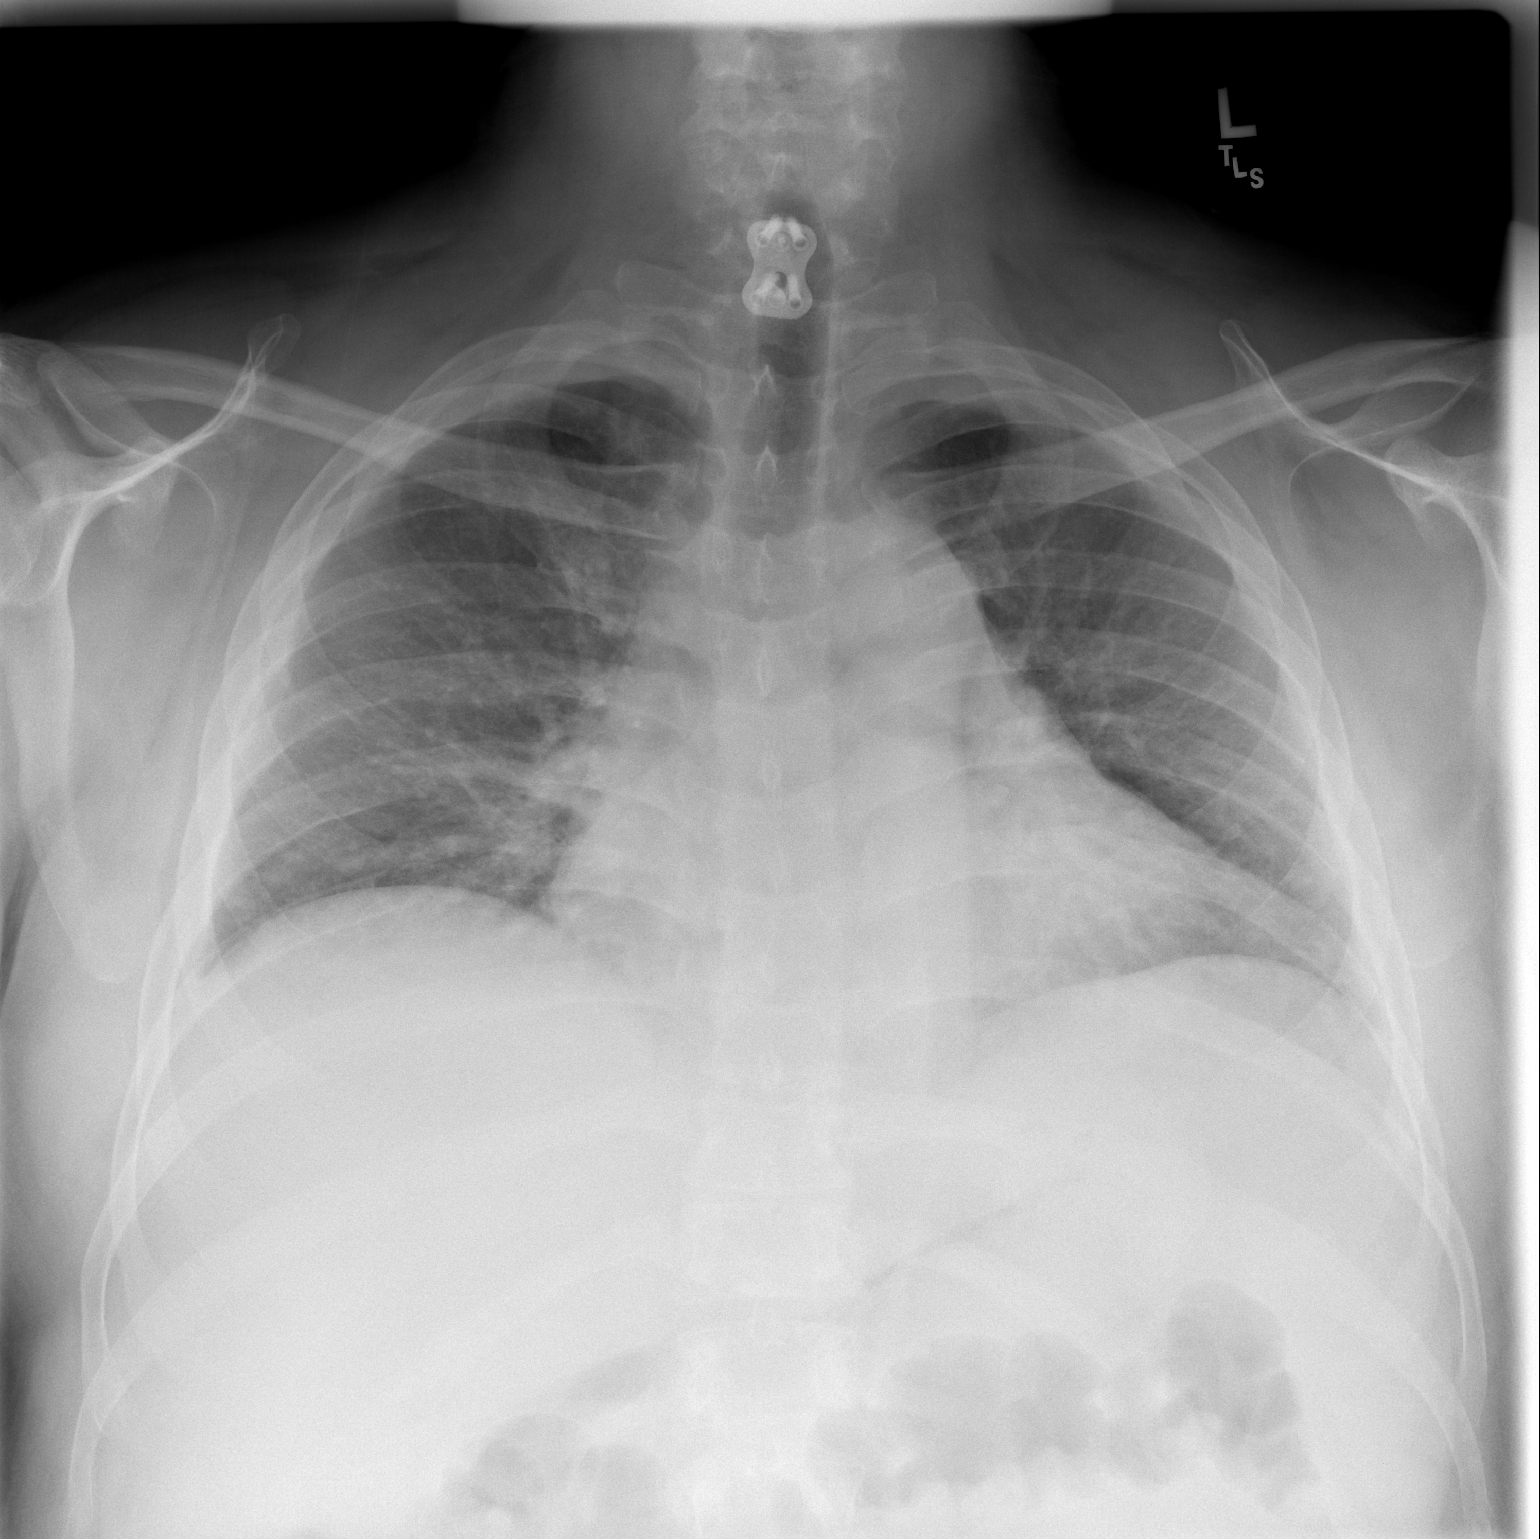

[w chest lat *]
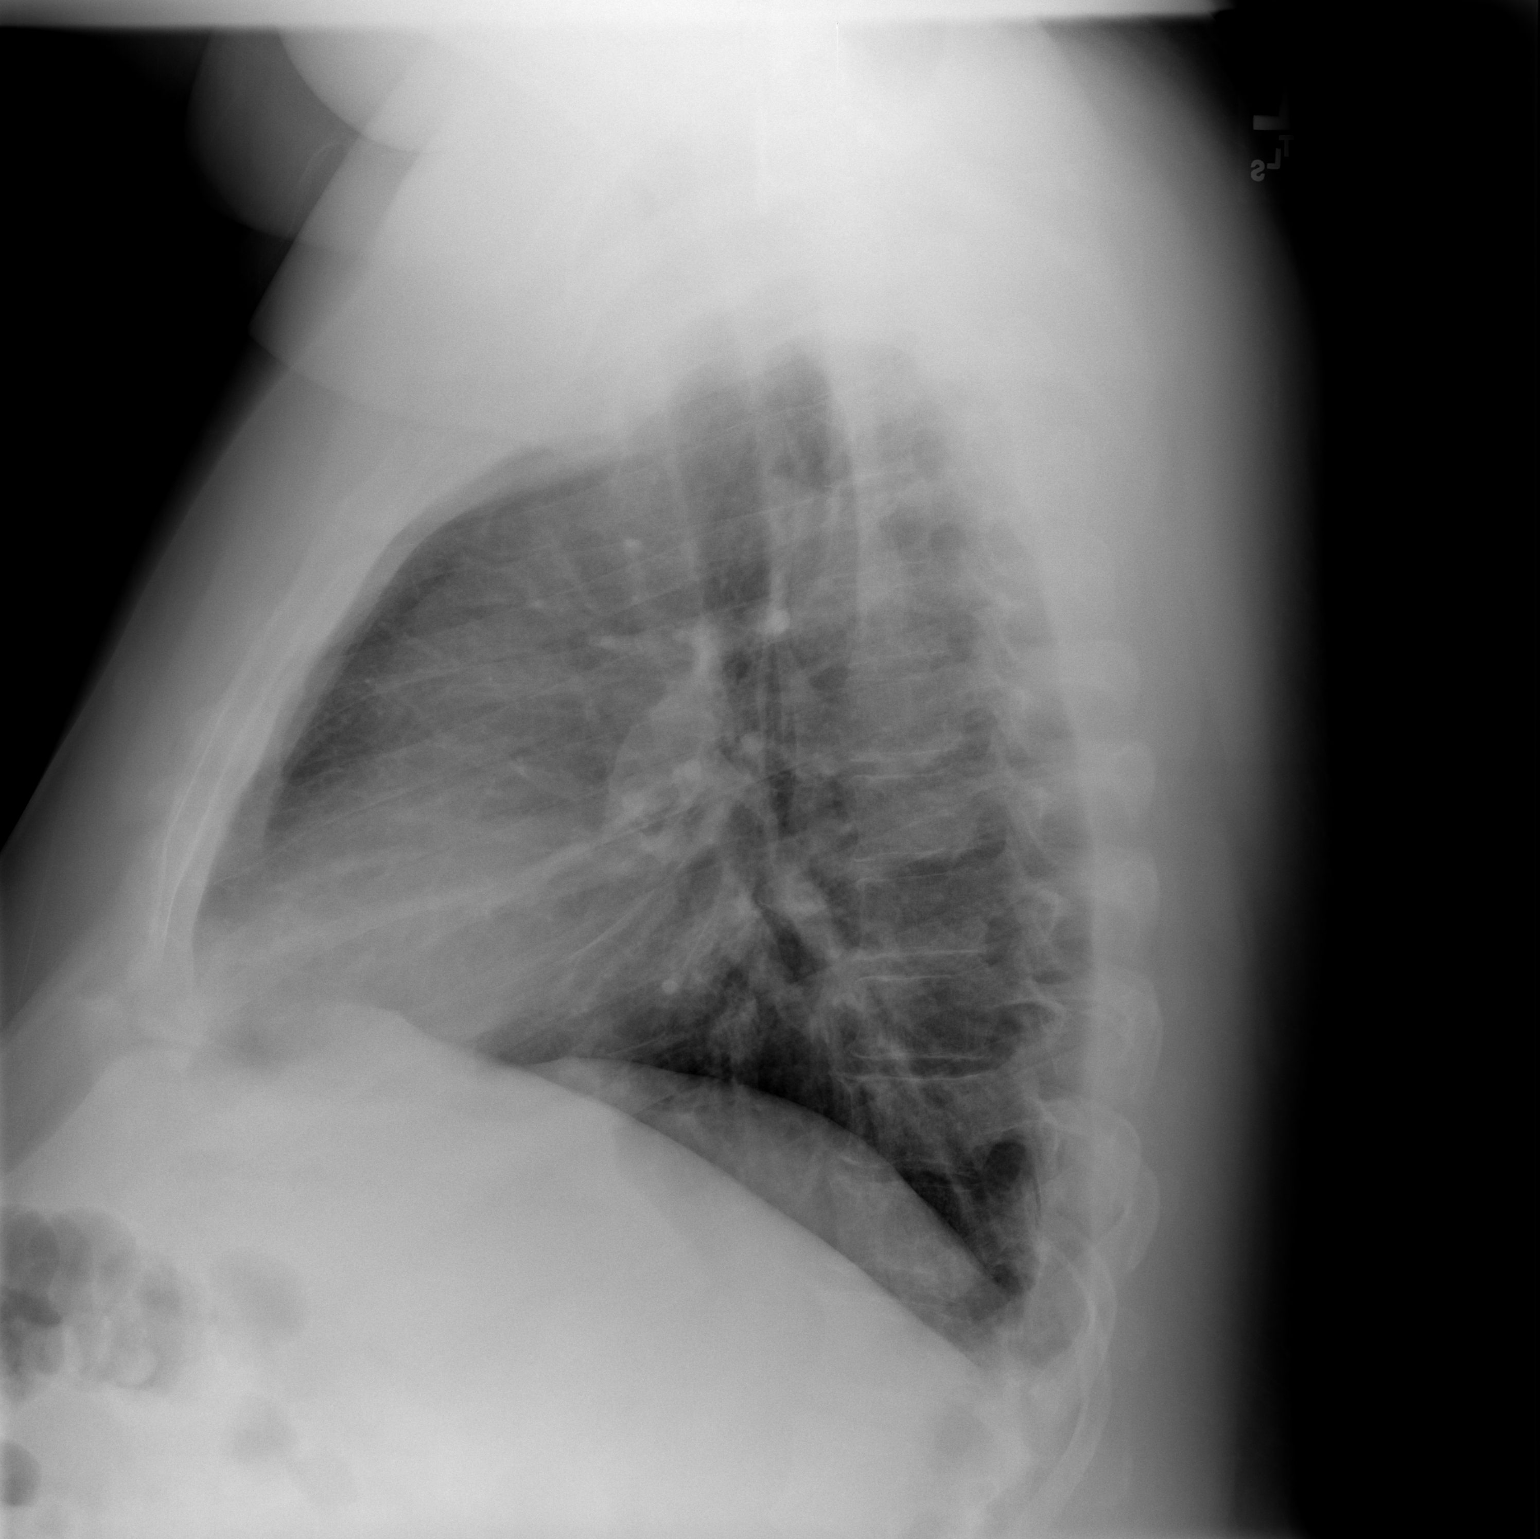

[2 of 2 positions shown; findings below may reference images not displayed]

FINDINGS: Cardiomegaly again noted.  Central vascular congestion again noted without significant change.  There is no convincing pulmonary edema.  There is no acute infiltrate or pleural effusion.
IMPRESSION: Cardiomegaly without convincing pulmonary edema.  Mild central vascular congestion.

## 2008-02-09 ENCOUNTER — Telehealth: Payer: Self-pay | Admitting: *Deleted

## 2008-02-23 ENCOUNTER — Encounter (INDEPENDENT_AMBULATORY_CARE_PROVIDER_SITE_OTHER): Payer: Self-pay | Admitting: Family Medicine

## 2008-03-09 ENCOUNTER — Ambulatory Visit: Payer: Self-pay | Admitting: Family Medicine

## 2008-03-09 DIAGNOSIS — J984 Other disorders of lung: Secondary | ICD-10-CM | POA: Insufficient documentation

## 2008-03-10 ENCOUNTER — Telehealth: Payer: Self-pay | Admitting: *Deleted

## 2008-03-13 ENCOUNTER — Encounter (HOSPITAL_COMMUNITY): Admission: RE | Admit: 2008-03-13 | Discharge: 2008-06-11 | Payer: Self-pay | Admitting: Cardiology

## 2008-03-13 ENCOUNTER — Encounter (INDEPENDENT_AMBULATORY_CARE_PROVIDER_SITE_OTHER): Payer: Self-pay | Admitting: Family Medicine

## 2008-03-13 LAB — CONVERTED CEMR LAB: LDL Cholesterol: 135 mg/dL — ABNORMAL HIGH

## 2008-03-14 ENCOUNTER — Telehealth: Payer: Self-pay | Admitting: *Deleted

## 2008-04-07 ENCOUNTER — Encounter (INDEPENDENT_AMBULATORY_CARE_PROVIDER_SITE_OTHER): Payer: Self-pay | Admitting: Family Medicine

## 2008-04-10 ENCOUNTER — Telehealth (INDEPENDENT_AMBULATORY_CARE_PROVIDER_SITE_OTHER): Payer: Self-pay | Admitting: Family Medicine

## 2008-04-13 ENCOUNTER — Encounter (INDEPENDENT_AMBULATORY_CARE_PROVIDER_SITE_OTHER): Payer: Self-pay | Admitting: Family Medicine

## 2008-04-13 ENCOUNTER — Ambulatory Visit: Payer: Self-pay | Admitting: Family Medicine

## 2008-04-13 DIAGNOSIS — R209 Unspecified disturbances of skin sensation: Secondary | ICD-10-CM | POA: Insufficient documentation

## 2008-05-18 ENCOUNTER — Telehealth: Payer: Self-pay | Admitting: *Deleted

## 2008-05-23 ENCOUNTER — Ambulatory Visit: Payer: Self-pay | Admitting: Family Medicine

## 2008-05-26 ENCOUNTER — Telehealth: Payer: Self-pay | Admitting: *Deleted

## 2008-06-01 ENCOUNTER — Encounter: Payer: Self-pay | Admitting: Family Medicine

## 2008-06-01 ENCOUNTER — Telehealth (INDEPENDENT_AMBULATORY_CARE_PROVIDER_SITE_OTHER): Payer: Self-pay | Admitting: *Deleted

## 2008-06-01 ENCOUNTER — Ambulatory Visit: Payer: Self-pay | Admitting: Family Medicine

## 2008-06-02 ENCOUNTER — Encounter (INDEPENDENT_AMBULATORY_CARE_PROVIDER_SITE_OTHER): Payer: Self-pay | Admitting: Family Medicine

## 2008-06-14 ENCOUNTER — Telehealth: Payer: Self-pay | Admitting: *Deleted

## 2008-06-15 ENCOUNTER — Encounter (INDEPENDENT_AMBULATORY_CARE_PROVIDER_SITE_OTHER): Payer: Self-pay | Admitting: Family Medicine

## 2008-07-10 ENCOUNTER — Telehealth (INDEPENDENT_AMBULATORY_CARE_PROVIDER_SITE_OTHER): Payer: Self-pay | Admitting: Family Medicine

## 2008-08-05 ENCOUNTER — Emergency Department (HOSPITAL_COMMUNITY): Admission: EM | Admit: 2008-08-05 | Discharge: 2008-08-05 | Payer: Self-pay | Admitting: Emergency Medicine

## 2008-08-16 ENCOUNTER — Telehealth: Payer: Self-pay | Admitting: Sports Medicine

## 2008-08-17 ENCOUNTER — Encounter (INDEPENDENT_AMBULATORY_CARE_PROVIDER_SITE_OTHER): Payer: Self-pay | Admitting: Family Medicine

## 2008-08-31 ENCOUNTER — Telehealth: Payer: Self-pay | Admitting: Sports Medicine

## 2008-09-01 ENCOUNTER — Telehealth: Payer: Self-pay | Admitting: *Deleted

## 2008-10-10 ENCOUNTER — Ambulatory Visit: Payer: Self-pay | Admitting: Family Medicine

## 2008-10-10 DIAGNOSIS — B351 Tinea unguium: Secondary | ICD-10-CM | POA: Insufficient documentation

## 2008-10-12 ENCOUNTER — Emergency Department (HOSPITAL_COMMUNITY): Admission: EM | Admit: 2008-10-12 | Discharge: 2008-10-12 | Payer: Self-pay | Admitting: Psychology

## 2008-12-01 ENCOUNTER — Telehealth: Payer: Self-pay | Admitting: Family Medicine

## 2008-12-04 ENCOUNTER — Telehealth: Payer: Self-pay | Admitting: Family Medicine

## 2008-12-07 ENCOUNTER — Ambulatory Visit: Payer: Self-pay | Admitting: Family Medicine

## 2008-12-17 ENCOUNTER — Telehealth: Payer: Self-pay | Admitting: Family Medicine

## 2009-01-07 ENCOUNTER — Telehealth: Payer: Self-pay | Admitting: Family Medicine

## 2009-01-23 ENCOUNTER — Ambulatory Visit: Payer: Self-pay | Admitting: Family Medicine

## 2009-01-24 ENCOUNTER — Telehealth: Payer: Self-pay | Admitting: Family Medicine

## 2009-01-26 ENCOUNTER — Encounter: Payer: Self-pay | Admitting: Family Medicine

## 2009-02-02 ENCOUNTER — Ambulatory Visit: Payer: Self-pay | Admitting: Family Medicine

## 2009-02-02 DIAGNOSIS — M545 Low back pain, unspecified: Secondary | ICD-10-CM | POA: Insufficient documentation

## 2009-02-15 ENCOUNTER — Telehealth: Payer: Self-pay | Admitting: Family Medicine

## 2009-02-15 ENCOUNTER — Telehealth: Payer: Self-pay | Admitting: *Deleted

## 2009-03-08 ENCOUNTER — Ambulatory Visit: Payer: Self-pay | Admitting: Family Medicine

## 2009-03-09 ENCOUNTER — Telehealth: Payer: Self-pay | Admitting: Family Medicine

## 2009-04-10 ENCOUNTER — Ambulatory Visit: Payer: Self-pay | Admitting: Family Medicine

## 2009-04-17 ENCOUNTER — Telehealth: Payer: Self-pay | Admitting: Family Medicine

## 2009-04-17 ENCOUNTER — Encounter: Payer: Self-pay | Admitting: Family Medicine

## 2009-04-17 ENCOUNTER — Ambulatory Visit: Payer: Self-pay | Admitting: Family Medicine

## 2009-04-17 LAB — CONVERTED CEMR LAB
AST: 38 units/L — ABNORMAL HIGH (ref 0–37)
CO2: 25 meq/L (ref 19–32)
Chloride: 102 meq/L (ref 96–112)
Cortisol, Plasma: 10.2 ug/dL
Creatinine, Ser: 0.95 mg/dL (ref 0.40–1.50)
LDL Cholesterol: 97 mg/dL (ref 0–99)
TSH: 1.231 microintl units/mL (ref 0.350–4.500)
Total Bilirubin: 0.7 mg/dL (ref 0.3–1.2)
Total CHOL/HDL Ratio: 5.3

## 2009-04-18 ENCOUNTER — Telehealth: Payer: Self-pay | Admitting: Family Medicine

## 2009-04-19 ENCOUNTER — Ambulatory Visit: Payer: Self-pay | Admitting: Family Medicine

## 2009-05-01 ENCOUNTER — Ambulatory Visit: Payer: Self-pay | Admitting: Family Medicine

## 2009-05-06 ENCOUNTER — Telehealth: Payer: Self-pay | Admitting: Family Medicine

## 2009-05-09 ENCOUNTER — Encounter: Admission: RE | Admit: 2009-05-09 | Discharge: 2009-08-07 | Payer: Self-pay | Admitting: Family Medicine

## 2009-06-02 ENCOUNTER — Telehealth: Payer: Self-pay | Admitting: Family Medicine

## 2009-06-03 ENCOUNTER — Telehealth: Payer: Self-pay | Admitting: Family Medicine

## 2009-06-04 ENCOUNTER — Ambulatory Visit: Payer: Self-pay | Admitting: Family Medicine

## 2009-06-04 LAB — CONVERTED CEMR LAB: Rapid Strep: NEGATIVE

## 2009-06-11 ENCOUNTER — Telehealth: Payer: Self-pay | Admitting: Family Medicine

## 2009-06-19 ENCOUNTER — Encounter: Payer: Self-pay | Admitting: Family Medicine

## 2009-09-10 ENCOUNTER — Ambulatory Visit: Payer: Self-pay | Admitting: Family Medicine

## 2009-09-12 ENCOUNTER — Telehealth: Payer: Self-pay | Admitting: Family Medicine

## 2009-10-03 ENCOUNTER — Telehealth (INDEPENDENT_AMBULATORY_CARE_PROVIDER_SITE_OTHER): Payer: Self-pay | Admitting: Family Medicine

## 2009-10-05 ENCOUNTER — Ambulatory Visit: Payer: Self-pay | Admitting: Family Medicine

## 2009-10-05 DIAGNOSIS — S025XXA Fracture of tooth (traumatic), initial encounter for closed fracture: Secondary | ICD-10-CM | POA: Insufficient documentation

## 2009-10-05 DIAGNOSIS — IMO0002 Reserved for concepts with insufficient information to code with codable children: Secondary | ICD-10-CM | POA: Insufficient documentation

## 2009-10-08 ENCOUNTER — Telehealth: Payer: Self-pay | Admitting: Family Medicine

## 2009-10-11 ENCOUNTER — Telehealth: Payer: Self-pay | Admitting: Family Medicine

## 2009-11-16 ENCOUNTER — Encounter: Payer: Self-pay | Admitting: Family Medicine

## 2009-11-16 ENCOUNTER — Ambulatory Visit: Payer: Self-pay | Admitting: Family Medicine

## 2009-11-16 DIAGNOSIS — R0789 Other chest pain: Secondary | ICD-10-CM | POA: Insufficient documentation

## 2009-11-16 DIAGNOSIS — R300 Dysuria: Secondary | ICD-10-CM | POA: Insufficient documentation

## 2009-11-16 LAB — CONVERTED CEMR LAB: GC Probe Amp, Urine: NEGATIVE

## 2009-11-20 ENCOUNTER — Encounter: Payer: Self-pay | Admitting: Family Medicine

## 2009-11-21 ENCOUNTER — Ambulatory Visit: Payer: Self-pay | Admitting: Family Medicine

## 2009-11-21 DIAGNOSIS — M25569 Pain in unspecified knee: Secondary | ICD-10-CM | POA: Insufficient documentation

## 2009-12-04 ENCOUNTER — Ambulatory Visit: Payer: Self-pay | Admitting: Family Medicine

## 2009-12-05 ENCOUNTER — Ambulatory Visit (HOSPITAL_COMMUNITY): Admission: RE | Admit: 2009-12-05 | Discharge: 2009-12-05 | Payer: Self-pay | Admitting: Family Medicine

## 2009-12-06 ENCOUNTER — Telehealth: Payer: Self-pay | Admitting: Family Medicine

## 2009-12-07 ENCOUNTER — Telehealth: Payer: Self-pay | Admitting: *Deleted

## 2009-12-10 ENCOUNTER — Telehealth: Payer: Self-pay | Admitting: Family Medicine

## 2009-12-14 ENCOUNTER — Ambulatory Visit: Payer: Self-pay | Admitting: Family Medicine

## 2009-12-14 ENCOUNTER — Telehealth: Payer: Self-pay | Admitting: Family Medicine

## 2009-12-17 ENCOUNTER — Telehealth: Payer: Self-pay | Admitting: Family Medicine

## 2009-12-21 ENCOUNTER — Ambulatory Visit: Payer: Self-pay | Admitting: Family Medicine

## 2009-12-24 ENCOUNTER — Telehealth: Payer: Self-pay | Admitting: Family Medicine

## 2009-12-26 ENCOUNTER — Ambulatory Visit: Payer: Self-pay | Admitting: Psychiatry

## 2009-12-26 ENCOUNTER — Telehealth: Payer: Self-pay | Admitting: Family Medicine

## 2009-12-27 ENCOUNTER — Ambulatory Visit: Payer: Self-pay | Admitting: Sports Medicine

## 2010-01-03 ENCOUNTER — Ambulatory Visit: Payer: Self-pay | Admitting: Psychiatry

## 2010-01-08 ENCOUNTER — Encounter: Payer: Self-pay | Admitting: Family Medicine

## 2010-01-08 ENCOUNTER — Ambulatory Visit: Payer: Self-pay | Admitting: Family Medicine

## 2010-01-09 ENCOUNTER — Ambulatory Visit: Payer: Self-pay | Admitting: Psychiatry

## 2010-01-15 ENCOUNTER — Encounter: Payer: Self-pay | Admitting: Family Medicine

## 2010-01-15 ENCOUNTER — Ambulatory Visit (HOSPITAL_COMMUNITY): Admission: RE | Admit: 2010-01-15 | Discharge: 2010-01-15 | Payer: Self-pay | Admitting: Family Medicine

## 2010-01-16 ENCOUNTER — Ambulatory Visit: Payer: Self-pay | Admitting: Psychiatry

## 2010-01-16 ENCOUNTER — Telehealth: Payer: Self-pay | Admitting: Family Medicine

## 2010-01-23 ENCOUNTER — Ambulatory Visit: Payer: Self-pay | Admitting: Psychiatry

## 2010-01-30 ENCOUNTER — Ambulatory Visit: Payer: Self-pay | Admitting: Psychiatry

## 2010-02-04 ENCOUNTER — Ambulatory Visit: Payer: Self-pay | Admitting: Family Medicine

## 2010-02-06 ENCOUNTER — Ambulatory Visit: Payer: Self-pay | Admitting: Psychiatry

## 2010-02-07 ENCOUNTER — Ambulatory Visit: Payer: Self-pay | Admitting: Sports Medicine

## 2010-02-07 ENCOUNTER — Telehealth: Payer: Self-pay | Admitting: Family Medicine

## 2010-02-19 ENCOUNTER — Ambulatory Visit
Admission: RE | Admit: 2010-02-19 | Discharge: 2010-02-19 | Payer: Self-pay | Source: Home / Self Care | Attending: Psychiatry | Admitting: Psychiatry

## 2010-02-24 ENCOUNTER — Telehealth: Payer: Self-pay | Admitting: Sports Medicine

## 2010-02-27 ENCOUNTER — Ambulatory Visit: Admission: RE | Admit: 2010-02-27 | Discharge: 2010-02-27 | Payer: Self-pay | Source: Home / Self Care

## 2010-02-27 ENCOUNTER — Ambulatory Visit
Admission: RE | Admit: 2010-02-27 | Discharge: 2010-02-27 | Payer: Self-pay | Source: Home / Self Care | Attending: Psychiatry | Admitting: Psychiatry

## 2010-03-01 ENCOUNTER — Telehealth: Payer: Self-pay | Admitting: Family Medicine

## 2010-03-06 ENCOUNTER — Ambulatory Visit
Admission: RE | Admit: 2010-03-06 | Discharge: 2010-03-06 | Payer: Self-pay | Source: Home / Self Care | Attending: Psychiatry | Admitting: Psychiatry

## 2010-03-06 ENCOUNTER — Ambulatory Visit: Admission: RE | Admit: 2010-03-06 | Discharge: 2010-03-06 | Payer: Self-pay | Source: Home / Self Care

## 2010-03-13 ENCOUNTER — Ambulatory Visit: Admit: 2010-03-13 | Payer: Self-pay

## 2010-03-13 ENCOUNTER — Ambulatory Visit: Admit: 2010-03-13 | Payer: Self-pay | Admitting: Psychiatry

## 2010-03-19 NOTE — Assessment & Plan Note (Signed)
Summary: knee pain,df   Vital Signs:  Patient profile:   40 year old male Weight:      329 pounds Temp:     97.5 degrees F oral Pulse rate:   79 / minute Pulse rhythm:   regular BP sitting:   150 / 105  (right arm) Cuff size:   large  Vitals Entered By: Loralee Pacas CMA (December 14, 2009 1:56 PM)  Serial Vital Signs/Assessments:  Time      Position  BP       Pulse  Resp  Temp     By 2:44 PM             149/91   86                    Loralee Pacas CMA  CC: knee pain   Primary Care Provider:  Ellery Plunk MD  CC:  knee pain.  History of Present Illness: knee still painful, getting worse.  no further injuries.  no swellign except when he wears the brace.  mobiq doesn't help.  wants an MRI because o the pain.  Current Medications (verified): 1)  Neurontin 300 Mg Caps (Gabapentin) .... Take 1 Capsule By Mouth Three Times A Day 2)  Prilosec 40 Mg  Cpdr (Omeprazole) .... Take One Tablet By Mouth Daily 3)  Prozac 40 Mg Caps (Fluoxetine Hcl) .Marland Kitchen.. 1 Capsule By Mouth Once A Day 4)  Metoprolol Tartrate 50 Mg  Tabs (Metoprolol Tartrate) .... Take One Tablet Two Times A Day 5)  Trazodone Hcl 100 Mg  Tabs (Trazodone Hcl) .... Takes 2 Tablets At Bedtime - Prescribed By Brock Bad, Psych 6)  Abilify 5 Mg  Tabs (Aripiprazole) .... Take One Tablet By Mouth Daily 7)  Clobetasol Propionate 0.05 %  Crea (Clobetasol Propionate) .... 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 Dispense 1 Tube 8)  Proair Hfa 108 (90 Base) Mcg/act Aers (Albuterol Sulfate) .... Inhale One Puff Every 4 Hours As Needed Wheezing. 9)  2 Wrist Braces .... Please Provide Left and Right Wrist Brace For Bilateral Hand Numbness That Occurs Every Night. Has Hx of Carpal Tunnel. 10)  Zofran Odt 8 Mg Tbdp (Ondansetron) .... Place One Under Tongue and Allow To Dissolve Every 6-8 Hours As Needed Nausea 11)  Meloxicam 7.5 Mg Tabs (Meloxicam) .... Take One By Mouth Daily.  Do Not Take Advil With This  Medicine  Allergies (verified): No Known Drug Allergies  Review of Systems  The patient denies anorexia, fever, and weight loss.    Physical Exam  General:  vs reivewed.  well-developed, well-nourished, and well-hydrated.   Head:  normocephalic and atraumatic.   Msk:  knee is stable with negative drawer test.  painful to palpation diffusely.  no swelling. no bruising.   Impression & Recommendations:  Problem # 1:  KNEE PAIN, ACUTE (ICD-719.46) Assessment Deteriorated will send to [facility].  reviewed case with Dr. Deirdre Priest who agreed no reason for MRI.  he did not want injection at this time.   His updated medication list for this problem includes:    Meloxicam 7.5 Mg Tabs (Meloxicam) .Marland Kitchen... Take one by mouth daily.  do not take advil with this medicine  Orders: [facility]- Est Level  3 (16109)  Complete Medication List: 1)  Neurontin 300 Mg Caps (Gabapentin) .... Take 1 capsule by mouth three times a day 2)  Prilosec 40 Mg Cpdr (Omeprazole) .... Take one tablet by mouth daily 3)  Prozac 40 Mg Caps (Fluoxetine hcl) .Marland Kitchen.. 1 capsule by mouth once a day 4)  Metoprolol Tartrate 50 Mg Tabs (Metoprolol tartrate) .... Take one tablet two times a day 5)  Trazodone Hcl 100 Mg Tabs (Trazodone hcl) .... Takes 2 tablets at bedtime - prescribed by linda greninger, psych 6)  Abilify 5 Mg Tabs (Aripiprazole) .... Take one tablet by mouth daily 7)  Clobetasol Propionate 0.05 % Crea (Clobetasol propionate) .... Apply to psorisis on hands two times a day for 2 weeks. dispense 1 tube 8)  Proair Hfa 108 (90 Base) Mcg/act Aers (Albuterol sulfate) .... Inhale one puff every 4 hours as needed wheezing. 9)  2 Wrist Braces  .... Please provide left and right wrist brace for bilateral hand numbness that occurs every night. has hx of carpal tunnel. 10)  Zofran Odt 8 Mg Tbdp (Ondansetron) .... Place one under tongue and allow to dissolve every 6-8 hours as needed nausea 11)  Meloxicam 7.5 Mg Tabs (Meloxicam) .... Take  one by mouth daily.  do not take advil with this medicine  Patient Instructions: 1)  It was nice to see you today! 2)  Please make an appt with the sports medicine clinic for a second opinion. 3)  Please come back to see me in 1-2 months to follow up your blood pressure.   Orders Added: 1)  [facility]- Est Level  3 [02542]

## 2010-03-19 NOTE — Assessment & Plan Note (Signed)
Summary: sore throat/cough,tcb   Vital Signs:  Patient profile:   40 year old male Height:      72 inches Weight:      329 pounds BMI:     44.78 Temp:     97.7 degrees F oral Pulse rate:   92 / minute BP sitting:   141 / 88  (left arm) Cuff size:   large  Vitals Entered By: Tessie Fass CMA (June 04, 2009 1:49 PM) CC: cough and sore throat x 2 days Is Patient Diabetic? No   Primary Care Provider:  Ellery Plunk MD  CC:  cough and sore throat x 2 days.  History of Present Illness: 40 year old:  1. Sore throat: x 2 days, associated with dry cough and runny nose. Denies ever/chills, HA, ear pain, N/V/D, abdominal pain, LE edema, wheeze, SOB, CP. He has a Hx of seasonal allergies for which he takes Claritin daily and mild asthma, but has not needed his albuterol. No sick contacts.  Habits & Providers  Alcohol-Tobacco-Diet     Tobacco Status: never  Current Medications (verified): 1)  Neurontin 300 Mg Caps (Gabapentin) .... Take 1 Capsule By Mouth Three Times A Day 2)  Prilosec 40 Mg  Cpdr (Omeprazole) .... Take One Tablet By Mouth Daily 3)  Prozac 40 Mg Caps (Fluoxetine Hcl) .Marland Kitchen.. 1 Capsule By Mouth Once A Day 4)  Metoprolol Tartrate 50 Mg  Tabs (Metoprolol Tartrate) .... Take One Tablet Two Times A Day 5)  Trazodone Hcl 100 Mg  Tabs (Trazodone Hcl) .... Takes 2 Tablets At Bedtime - Prescribed By Brock Bad, Psych 6)  Abilify 5 Mg  Tabs (Aripiprazole) .... Take One Tablet By Mouth Daily 7)  Clobetasol Propionate 0.05 %  Crea (Clobetasol Propionate) .... 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 Dispense 1 Tube 8)  Proair Hfa 108 (90 Base) Mcg/act Aers (Albuterol Sulfate) .... Inhale One Puff Every 4 Hours As Needed Wheezing. 9)  2 Wrist Braces .... Please Provide Left and Right Wrist Brace For Bilateral Hand Numbness That Occurs Every Night. Has Hx of Carpal Tunnel. 10)  Cvs Loratadine 10 Mg Tabs (Loratadine) .... Take One Tablet Daily As Needed  Allergies 11)  Meclizine Hcl 25 Mg Tabs (Meclizine Hcl) .Marland Kitchen.. 1 Tab By Mouth Three Times A Day As Needed For Vertigo 12)  Fluticasone Propionate 50 Mcg/act  Susp (Fluticasone Propionate) .... 2 Sprays Each Nostril Once Daily  Allergies (verified): No Known Drug Allergies PMH-FH-SH reviewed for relevance  Review of Systems      See HPI  Physical Exam  General:  Well-developed, well-nourished, in no acute distress; alert, appropriate and cooperative throughout examination. Vitals reviewed and WNL. Head:  Normocephalic and atraumatic without obvious abnormalities.  Eyes:  No corneal or conjunctival inflammation noted. EOMI. Perrla.  Ears:  R ear normal and L ear normal.   Nose:  Clear nasal DC. Mouth:  PND. Mild erythema, no exudates or petechia. Neck:  Supple with adenopathy. Lungs:  Normal respiratory effort, chest expands symmetrically. Lungs are clear to auscultation, no crackles or wheezes. Heart:  Normal rate and regular rhythm. S1 and S2 normal without gallop, murmur, click, rub or other extra sounds. Extremities:  No edema.   Impression & Recommendations:  Problem # 1:  SORE THROAT (ICD-462) Assessment New  Orders: Rapid Strep-[facility] (29562) [facility]- Est Level  3 (13086)  Problem # 2:  ALLERGIC RHINITIS (ICD-477.9) Assessment: New  Rx Fluticasone nasal spray. Rec continuing daily  antihistamine. His updated medication list for this problem includes:    Cvs Loratadine 10 Mg Tabs (Loratadine) .Marland Kitchen... Take one tablet daily as needed allergies    Fluticasone Propionate 50 Mcg/act Susp (Fluticasone propionate) .Marland Kitchen... 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  Orders: [facility]- Est Level  3 (60454)  Complete Medication List: 1)  Neurontin 300 Mg Caps (Gabapentin) .... Take 1 capsule by mouth three times a day 2)  Prilosec 40 Mg Cpdr (Omeprazole) .... Take one tablet by mouth  daily 3)  Prozac 40 Mg Caps (Fluoxetine hcl) .Marland Kitchen.. 1 capsule by mouth once a day 4)  Metoprolol Tartrate 50 Mg Tabs (Metoprolol tartrate) .... Take one tablet two times a day 5)  Trazodone Hcl 100 Mg Tabs (Trazodone hcl) .... Takes 2 tablets at bedtime - prescribed by linda greninger, psych 6)  Abilify 5 Mg Tabs (Aripiprazole) .... Take one tablet by mouth daily 7)  Clobetasol Propionate 0.05 % Crea (Clobetasol propionate) .... Apply to psorisis on hands two times a day for 2 weeks. dispense 1 tube 8)  Proair Hfa 108 (90 Base) Mcg/act Aers (Albuterol sulfate) .... Inhale one puff every 4 hours as needed wheezing. 9)  2 Wrist Braces  .... Please provide left and right wrist brace for bilateral hand numbness that occurs every night. has hx of carpal tunnel. 10)  Cvs Loratadine 10 Mg Tabs (Loratadine) .... Take one tablet daily as needed allergies 11)  Meclizine Hcl 25 Mg Tabs (Meclizine hcl) .Marland Kitchen.. 1 tab by mouth three times a day as needed for vertigo 12)  Fluticasone Propionate 50 Mcg/act Susp (Fluticasone propionate) .... 2 sprays each nostril once daily  Patient Instructions: 1)  It looks like your sore throat is caused by allergies making you have a runny nose which then drains down the back of your throat. 2)  I am prescribing a bose spray for you to use daily. 3)  Continue taking the Loratadine. Prescriptions: FLUTICASONE PROPIONATE 50 MCG/ACT  SUSP (FLUTICASONE PROPIONATE) 2 sprays each nostril once daily  #1 vial x 3   Entered and Authorized by:   Helane Rima DO   Signed by:   Helane Rima DO on 06/04/2009   Method used:   Electronically to        [facility] [phone]* (retail)       [address]       [place], [place]  [address]       Ph: [phone]       Fax: [phone]   RxID:   [id number]   Prevention & Chronic Care Immunizations   Influenza vaccine: Not documented   Influenza vaccine deferral: Not indicated  (06/04/2009)    Tetanus booster: 02/17/2001:  Done.   Tetanus booster due: 02/18/2011    Pneumococcal vaccine: Not documented  Other Screening   Smoking status: never  (06/04/2009)  Lipids   Total Cholesterol: 160  (04/17/2009)   Lipid panel action/deferral: Lipid Panel ordered   LDL: 97  (04/17/2009)   LDL Direct: Not documented   HDL: 30  (04/17/2009)   Triglycerides: 166  (04/17/2009)    SGOT (AST): 38  (04/17/2009)   BMP action: Ordered   SGPT (ALT): 76  (04/17/2009)   Alkaline phosphatase: 71  (04/17/2009)   Total bilirubin: 0.7  (04/17/2009)    Lipid flowsheet reviewed?: Yes   Progress toward LDL goal: Unchanged  Hypertension   Last Blood Pressure: 141 / 88  (06/04/2009)   Serum creatinine: 0.95  (04/17/2009)   BMP action: Not indicated   Serum potassium 4.4  (04/17/2009)   Basic metabolic panel due: 05/08/2009    Hypertension flowsheet reviewed?: Yes   Progress toward BP goal: At goal  Self-Management Support :   Personal Goals (by the next clinic visit) :      Personal blood pressure goal: 140/90  (04/10/2009)     Personal LDL goal: 100  (04/10/2009)    Patient will work on the following items until the next clinic visit to reach self-care goals:     Medications and monitoring: take my medicines every day, bring all of my medications to every visit  (06/04/2009)     Eating: drink diet soda or water instead of juice or soda, eat more vegetables, use fresh or frozen vegetables, eat foods that are low in salt, eat baked foods instead of fried foods, eat fruit for snacks and desserts, limit or avoid alcohol  (06/04/2009)     Activity: take a 30 minute walk every day, take the stairs instead of the elevator, park at the far end of the parking lot  (06/04/2009)    Hypertension self-management support: Written self-care plan  (06/04/2009)   Hypertension self-care plan printed.    Lipid self-management support: Written self-care plan  (06/04/2009)   Lipid self-care plan printed.  Laboratory Results   Date/Time Received: June 04, 2009 1:58 PM  Date/Time Reported: June 04, 2009 2:12 PM   Other Tests  Rapid Strep: negative Comments: ...........test performed by...........Marland KitchenTerese Door, CMA

## 2010-03-19 NOTE — Assessment & Plan Note (Signed)
Summary: KNEE PAIN,MC   Vital Signs:  Patient profile:   40 year old male Pulse rate:   73 / minute BP sitting:   148 / 90  (right arm)  Vitals Entered By: Rochele Pages RN (December 27, 2009 1:37 PM) CC: lt knee pain - worsening   Primary Provider:  Ellery Plunk MD  CC:  lt knee pain - worsening.  History of Present Illness: Pt here for follow up of left knee pain that was seen last week by Dr. Jennette Kettle.  The pt underwent an aspiration and injection of the knee by Dr. Jennette Kettle with some immediate relief however has deteriorated over the past week.  He is unable to move the knee without pain today and is having a difficult time with weight bearing.  He has not re-injured this knee.  He does report using both Mobic and Ibprofen (although he was provided instructions to not do this) without relief.  He has not iced the knee and he has not worn the brace that was provided because of increased discomfort.    Preventive Screening-Counseling & Management  Alcohol-Tobacco     Smoking Status: never  Allergies: No Known Drug Allergies  Physical Exam  General:  Well-developed,well-nourished; alert,appropriate and cooperative throughout examination; Mild physical distress; No respiratory distress.  Slightly disheveled appearance.   Msk:  L knee:  slight effusion, no temperature changes within the knee, no erythema.  TTP diffusely, No ligamentous instability, severe pain with flexion or rotation,  Limited exam 2o/2 pain.      Extremities:  No clubbing, cyanosis, or deformity noted.  Psoritic lesions noted on hands Additional Exam:  U/S of L knee:  calcification within lateral meniscus; limited exam 2o/2 body habitus I did not see any suprapatellar pouch effusion patellar and quad tendons intact med meniscus - not well visualized but no obvious tear  no direct swelling at joint line so unclear what is chronic vs acute   Impression & Recommendations:  Problem # 1:  KNEE PAIN, ACUTE  (ICD-719.46)  His updated medication list for this problem includes:    Meloxicam 7.5 Mg Tabs (Meloxicam) .Marland Kitchen... Take one by mouth daily.  do not take advil with this medicine    Vicodin Es 7.5-750 Mg Tabs (Hydrocodone-acetaminophen) .Marland Kitchen... Take 1 every 4 hours as needed   I am not sure if he has a contused cartilage not enough evidence for meniscus tear  I think he needs to use knee sleeve  start or crutthces  will return to dr [facility] in 8 days  Orders: [place] LIMITED (13086)  Complete Medication List: 1)  Neurontin 300 Mg Caps (Gabapentin) .... Take 1 capsule by mouth three times a day 2)  Prilosec 40 Mg Cpdr (Omeprazole) .... Take one tablet by mouth daily 3)  Prozac 40 Mg Caps (Fluoxetine hcl) .Marland Kitchen.. 1 capsule by mouth once a day 4)  Metoprolol Tartrate 50 Mg Tabs (Metoprolol tartrate) .... Take one tablet two times a day 5)  Trazodone Hcl 100 Mg Tabs (Trazodone hcl) .... Takes 2 tablets at bedtime - prescribed by linda greninger, psych 6)  Abilify 5 Mg Tabs (Aripiprazole) .... Take one tablet by mouth daily 7)  Clobetasol Propionate 0.05 % Crea (Clobetasol propionate) .... Apply to psorisis on hands two times a day for 2 weeks. dispense 1 tube 8)  Proair Hfa 108 (90 Base) Mcg/act Aers (Albuterol sulfate) .... Inhale one puff every 4 hours as needed wheezing. 9)  2 Wrist Braces  .... Please provide  left and right wrist brace for bilateral hand numbness that occurs every night. has hx of carpal tunnel. 10)  Zofran Odt 8 Mg Tbdp (Ondansetron) .... Place one under tongue and allow to dissolve every 6-8 hours as needed nausea 11)  Meloxicam 7.5 Mg Tabs (Meloxicam) .... Take one by mouth daily.  do not take advil with this medicine 12)  Vicodin Es 7.5-750 Mg Tabs (Hydrocodone-acetaminophen) .... Take 1 every 4 hours as needed  Patient Instructions: 1)  We are giving you Vicodin to help with the pain.  You can take one of these every 4 hours as needed.  Do NOT take any extra Tylenol as  the Vicodin has Tylenol in it already.   2)  DO NOT take the Meloxicam (Mobic) any longer. 3)  If you need to you are able to take Ibprofen in between the Vicodin. 4)  Use your knee brace. 5)  Use either a walker or can for the next couple of days and as needed. 6)  Follow up with Dr. Jennette Kettle next Friday. Prescriptions: VICODIN ES 7.5-750 MG TABS (HYDROCODONE-ACETAMINOPHEN) Take 1 every 4 hours as needed  #30 x 0   Entered by:   Rochele Pages RN   Authorized by:   Enid Baas MD   Signed by:   Rochele Pages RN on 12/27/2009   Method used:   Print then Give to Patient   RxID:   [id number]    Orders Added: 1)  Est. Patient Level III [35573] 2)  [place] LIMITED [22025]

## 2010-03-19 NOTE — Progress Notes (Signed)
Summary: Rx Req   Phone Note Refill Request Call back at Home Phone [phone] Message from:  Patient  Refills Requested: Medication #1:  [facility] LORATADINE 10 MG TABS Take one tablet daily as needed allergies USES [facility] [address]  Initial call taken by: Clydell Hakim,  June 11, 2009 2:09 PM  Follow-up for Phone Call        will forward to MD. Follow-up by: Theresia Lo RN,  June 11, 2009 2:12 PM    Prescriptions: [facility] LORATADINE 10 MG TABS (LORATADINE) Take one tablet daily as needed allergies  #30 x 11   Entered and Authorized by:   Ellery Plunk MD   Signed by:   Ellery Plunk MD on 06/12/2009   Method used:   Electronically to        [facility] [phone]* (retail)       [address]       [place], [place]  [address]       Ph: [phone]       Fax: [phone]   RxID:   [id number]  sent to pharmacy.  Ellery Plunk MD  June 12, 2009 9:16 PM

## 2010-03-19 NOTE — Assessment & Plan Note (Signed)
Summary: CPE/KH   Vital Signs:  Patient profile:   40 year old male Weight:      330 pounds Temp:     97.7 degrees F oral Pulse rate:   50 / minute Pulse rhythm:   regular BP sitting:   141 / 90  (right arm) Cuff size:   large  Vitals Entered By: Loralee Pacas CMA (April 10, 2009 3:28 PM)  Primary Care Provider:  Ellery Plunk MD  CC:  CPE.  History of Present Illness: HTN: pt not taking BP at home.  reviewed diet. not eating many veggies but does eat fruit.  weight up.  discussed that he snacks several times throughout night.  HL: THinks that this is checked through his psychiatrist, but thinks he is now due.    Back pain-doing well with just motrin, not taking the muscle relaxant  psoriasis- has not seen a dermatolgist in a while, uses a steroid cream but no emollients. has a lot of itching esp at night.    anxiety- well controlled  Habits & Providers  Alcohol-Tobacco-Diet     Alcohol drinks/day: 0     Tobacco Status: never     Diet Comments: discussed need to eat more fruit and vegetables  Exercise-Depression-Behavior     Does Patient Exercise: yes     Exercise Counseling: not indicated; exercise is adequate     Type of exercise: walks     Exercise (avg: min/session): 30-60     Times/week: 4     STD Risk: never     Drug Use: never     Seat Belt Use: always     Sun Exposure: infrequent  Current Problems (verified): 1)  Back Pain, Lumbar, Chronic  (ICD-724.2) 2)  Tinea Cruris  (ICD-110.3) 3)  Upper Respiratory Infection, Viral  (ICD-465.9) 4)  Onychomycosis, Toenails  (ICD-110.1) 5)  Obesity, 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) 6)  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) 7)  Hypertension, 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 Take 2 Capsule By Mouth Three Times A Day 2)  Prilosec 40 Mg  Cpdr (Omeprazole) .... Take One Tablet By Mouth Daily 3)  Prozac 40 Mg Caps (Fluoxetine Hcl) .Marland Kitchen.. 1 Capsule By Mouth Once A Day 4)  Metoprolol Tartrate 50 Mg  Tabs (Metoprolol Tartrate) .... Take One Tablet Two Times A Day 5)  Trazodone Hcl 100 Mg  Tabs (Trazodone Hcl) .... Takes 2 Tablets At Bedtime - Prescribed By Brock Bad, Psych 6)  Abilify 5 Mg  Tabs (Aripiprazole) .... Take One Tablet By Mouth Daily 7)  Clobetasol Propionate 0.05 %  Crea (Clobetasol Propionate) .... 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 Dispense 1 Tube 8)  Proair Hfa 108 (90 Base) Mcg/act Aers (Albuterol Sulfate) .... Inhale One Puff Every 4 Hours As Needed Wheezing. 9)  2 Wrist Braces .... Please Provide Left and Right Wrist Brace For Bilateral Hand Numbness That Occurs Every Night. Has Hx of Carpal Tunnel. 10)  Cvs  Loratadine 10 Mg Tabs (Loratadine) .... Take One Tablet Daily As Needed Allergies 11)  Robaxin 500 Mg Tabs (Methocarbamol) .... Take One Tab Every 8 Hours As Needed Muscle Spasm.  This Can Make You Drowsy  Allergies (verified): No Known Drug Allergies  Past History:  Past Medical History: Last updated: 08/17/2008 9/03 and 9/02 H.pylori positive - treated,  frequent visits to ED for various sx`s,  Generalized Anxiety Disorder, ?depression too,  mild L carpal tunnel, multiple episodes of muscle strains,   Back pain/neck pain - messed up disk in neck HTN GERD Pain Management - Dr. Laneta Simmers - prescribes Neurontin. Dr. Cardell Peach at [facility] - she prescribes Prozac and Trazadone. Sees conselor every  week. Neurologist - Dr. Wynetta Emery  Headaches - Migraines Eczema Psoriasis  MSK Chest pain 11/09 - Sleep study done showing mild to moderate sleep apnea - CPAP  03/2007 - Catherization by D. Hochrein was normal. EF 65%.  Normal wall motion.  Restrictive lung disease by PFTs in clinic 2009  Past Surgical History: Last updated: 05/26/2007 09/14/01 neck surgery (discectomy) Dr. Wynetta Emery - 11/04/2001 9/02 DG UGI - no ulcers - 11/12/2000 abnl EMG/nerve cond study- carpal tunnel - 12/18/2001 and 03/2006 Appendectomy - 10/15/2000 11/04 Decompressive lumbar decompressive laminectomy at L3-4 and L4-5  Family History: Last updated: 09/09/2006 mom-migraines No known heart problems or cancer or DM.  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 Lives alone. Family lives in [place] and parents won't talk to him. Spends time with the Jones family - his godparents - POA. [phone]. 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  Risk Factors: Alcohol Use: 0 (04/10/2009) Diet: discussed need to eat more fruit and vegetables (04/10/2009) Exercise: yes (04/10/2009)  Risk Factors: Smoking Status: never (04/10/2009)  Social History: Sun Exposure-Excessive:  infrequent Seat Belt Use:  always Drug Use:  never STD Risk:  never Does Patient Exercise:  yes  Review of Systems       The patient complains of weight gain.  The patient denies anorexia, fever, weight loss, prolonged cough, headaches, abdominal pain, severe indigestion/heartburn, and difficulty walking.    Physical Exam  General:  obesealert, well-nourished, appropriate dress, normal appearance, and cooperative to  examination.   Head:  normocephalic and atraumatic.   Eyes:  pupils equal, pupils round, and pupils reactive to light.   Ears:  R ear normal and L ear normal.   Mouth:  good dentition and pharynx pink and moist.   Neck:  supple, full ROM, and no masses.   Chest Wall:  no deformities, no tenderness, and no masses.   Lungs:  normal respiratory effort, normal breath sounds, and no dullness.   Heart:  normal rate, regular rhythm, no murmur, no gallop, and no rub.   Abdomen:  obesesoft, non-tender, and normal bowel sounds.   Msk:    chronic back pain with some limitation of ROM.   full strength arms and legs with no sensory deficits Extremities:  No clubbing, cyanosis, edema, or deformity noted with normal full range of motion of all joints.   Neurologic:  No cranial nerve deficits noted. Station and gait are normal. . Sensory, motor and coordinative functions appear intact. Skin:  psoriatic lesions on B hands and ankles, excoriations on arms and legs Psych:  Oriented X3, memory intact for recent and remote, normally interactive, good eye contact, not anxious appearing, and not depressed appearing.     Impression & Recommendations:  Problem # 1:  BACK PAIN, LUMBAR, CHRONIC (ICD-724.2) Assessment Unchanged using motrin to treat, not using much robaxin.  also using heat.   no changes His updated medication list for this problem includes:    Robaxin 500 Mg Tabs (Methocarbamol) .Marland Kitchen... Take one tab every 8 hours as needed muscle spasm.  this can make you drowsy  Orders: [facility] - Est  40-64 yrs ([address])  Problem # 2:  OBESITY, NOS (ICD-278.00) Assessment: Deteriorated has gained weight.  discussed snacking exercise and food choices, see instructions Orders: [facility] - Est  40-64 yrs ([id number])  Problem # 3:  HYPERTENSION, BENIGN SYSTEMIC (ICD-401.1) Assessment: Unchanged BP goal for him is 140/90, which is where he is.  no new meds.  discussed diet and exercise. will check a BMET His updated  medication list for this problem includes:    Metoprolol Tartrate 50 Mg Tabs (Metoprolol tartrate) .Marland Kitchen... Take one tablet two times a day  Orders: T-Basic Metabolic Panel [id number]) [facility] - Est  40-64 yrs ([id number])Future Orders: T-Basic Metabolic Panel [id number]) ... 04/03/2010  Problem # 4:  PSORIASIS (ICD-696.1) Assessment: Deteriorated having trouble with his hands and ankles.  not using emollient.  asked him to use vaseline after baths and gave him a refill of the steroid cream  Problem # 5:  DYSLIPIDEMIA (ICD-272.4) will check lipids.  need to be checked for his psych meds as well. Orders: T-Lipid Profile [id number]) [facility] - Est  40-64 yrs ([id number])Future Orders: T-Lipid Profile ([id number]) ... 05/09/2010  Problem # 6:  DEPRESSION/ANXIETY (ICD-300.4) no changes, is handling his anxiety ok per patient Orders: [facility] - Est  40-64 yrs ([id number])  Complete Medication List: 1)  Neurontin 300 Mg Caps (Gabapentin) .... Take 2 capsule by mouth three times a day 2)  Prilosec 40 Mg Cpdr (Omeprazole) .... Take one tablet by mouth daily 3)  Prozac 40 Mg Caps (Fluoxetine hcl) .Marland Kitchen.. 1 capsule by mouth once a day 4)  Metoprolol Tartrate 50 Mg Tabs (Metoprolol tartrate) .... Take one tablet two times a day 5)  Trazodone Hcl 100 Mg Tabs (Trazodone hcl) .... Takes 2 tablets at bedtime - prescribed by linda greninger, psych 6)  Abilify 5 Mg Tabs (Aripiprazole) .... Take one tablet by mouth daily 7)  Clobetasol Propionate 0.05 % Crea (Clobetasol propionate) .... Apply to psorisis on hands two times a day for 2 weeks. dispense 1 tube 8)  Proair Hfa 108 (90 Base) Mcg/act Aers (Albuterol sulfate) .... Inhale one puff every 4 hours as needed wheezing. 9)  2 Wrist Braces  .... Please provide left and right wrist brace for bilateral hand numbness that occurs every night. has hx of carpal tunnel. 10)  Cvs Loratadine 10 Mg Tabs (Loratadine) .... Take one tablet daily as needed allergies 11)  Robaxin 500 Mg  Tabs (Methocarbamol) .... Take one tab every 8 hours as needed muscle spasm.  this can make you drowsy  Other Orders: T-Hepatic Function [id number]) Future Orders: T-Hepatic Function [id number]) ... 03/28/2010  Patient Instructions: 1)  Please schedule a follow-up appointment in 3 months .  Make a lab appt next week for blood work 2)  increase fruits and vegetables-at least one vegetable per meal.  limit salt and fats like butter.   3)  raise the  head of your bed to prevent reflux 4)  try to walk everyday 30 min/day 5)  if you need a nighttime snack, try fruit. only 1 snack per night. 6)  FOr your psoriasis, apply vaseline as soon as you get out of the shower to all affected areas.  This helps keep the moisture in. Prescriptions: CLOBETASOL PROPIONATE 0.05 %  CREA (CLOBETASOL PROPIONATE) Apply to psorisis on hands two times a day for 2 weeks. Dispense 1 tube  #1 x 1   Entered and Authorized by:   Ellery Plunk MD   Signed by:   Ellery Plunk MD on 04/10/2009   Method used:   [facility] to        [facility]  [address] [phone]* (retail)       [address]       [place], [place]  [address]       Ph: [phone]       Fax: [phone]   RxID:   [id number]    Prevention & Chronic Care Immunizations   Influenza vaccine: Not documented    Tetanus booster: 02/17/2001: Done.   Tetanus booster due: 02/18/2011    Pneumococcal vaccine: Not documented  Other Screening    Smoking status: never  (04/10/2009)  Hypertension   Last Blood Pressure: 141 / 90  (04/10/2009)   Serum creatinine: 0.9  (03/31/2007)   BMP action: Not indicated   Serum potassium 3.9  (03/31/2007)   Basic metabolic panel due: 05/08/2009    Hypertension flowsheet reviewed?: Yes   Progress toward BP goal: At goal  Lipids   Total Cholesterol: 189  (03/13/2008)   Lipid panel action/deferral: Lipid Panel ordered   LDL: 135  (03/13/2008)   LDL Direct: Not documented   HDL: 30   (03/13/2008)   Triglycerides: 118  (03/13/2008)    SGOT (AST): Not documented   BMP action: Ordered   SGPT (ALT): Not documented   Alkaline phosphatase: Not documented   Total bilirubin: Not documented    Lipid flowsheet reviewed?: Yes   Progress toward LDL goal: Unchanged  Self-Management Support :   Personal Goals (by the next clinic visit) :      Personal blood pressure goal: 140/90  (04/10/2009)     Personal LDL goal: 100  (04/10/2009)    Patient will work on the following items until the next clinic visit to reach self-care goals:     Medications and monitoring: take my medicines every day  (04/10/2009)     Activity: take a 30 minute walk every day  (04/10/2009)    Hypertension self-management support: Written self-care plan  (04/10/2009)   Hypertension self-care plan printed.    Lipid self-management support: Written self-care plan  (04/10/2009)   Lipid self-care plan printed.   Appended Document: CPE/KH     Clinical Lists Changes  Problems: Added new problem of HEALTH MAINTENANCE EXAM (ICD-V70.0) Orders: Added new Test order of [facility] - Est  18-39 yrs [phone]) - Signed

## 2010-03-19 NOTE — Assessment & Plan Note (Signed)
Summary: fu/kh   Vital Signs:  Patient profile:   39 year old male Height:      72 inches Weight:      329 pounds BMI:     44.78 BSA:     2.63 Temp:     98.1 degrees F Pulse rate:   73 / minute BP sitting:   161 / 90  Vitals Entered By: Jone Baseman CMA (March 08, 2009 3:06 PM) CC: chronic back pain, Back Pain Is Patient Diabetic? No Pain Assessment Patient in pain? yes     Location: back Intensity: 10   Primary Care Provider:  Ellery Plunk MD  CC:  chronic back pain and Back Pain.  History of Present Illness: Pt presents to discuss back pain.  this pain is longstanding, since his accident and subsequent surgeries.  He had a recent episode of pain on Sunday for which he took a valium that he recieved at an urgent care.  He reports that this helped him.  He denies any overuse or injury preceding event, however he did report increased sedentary behavior on Friday and Saturday. Whe has had episodes of pain in the past that are helped by robaxin or valium, also by heat.  not by advil or tylenol.  Has TENS unit that he doesnt think is helpful.    Pt has started exercising at the [facility]  He walks and reports that his pain is improved.    Habits & Providers  Alcohol-Tobacco-Diet     Tobacco Status: never  Current Medications (verified): 1)  Neurontin 300 Mg Caps (Gabapentin) .... Take 2 Capsule By Mouth Three Times A Day 2)  Prilosec 40 Mg  Cpdr (Omeprazole) .... Take One Tablet By Mouth Daily 3)  Prozac 40 Mg Caps (Fluoxetine Hcl) .Marland Kitchen.. 1 Capsule By Mouth Once A Day 4)  Metoprolol Tartrate 50 Mg  Tabs (Metoprolol Tartrate) .... Take One Tablet Two Times A Day 5)  Trazodone Hcl 100 Mg  Tabs (Trazodone Hcl) .... Takes 2 Tablets At Bedtime - Prescribed By Brock Bad, Psych 6)  Abilify 5 Mg  Tabs (Aripiprazole) .... Take One Tablet By Mouth Daily 7)  Clobetasol Propionate 0.05 %  Crea (Clobetasol Propionate) .... 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  Dispense 1 Tube 8)  Proair Hfa 108 (90 Base) Mcg/act Aers (Albuterol Sulfate) .... Inhale One Puff Every 4 Hours As Needed Wheezing. 9)  2 Wrist Braces .... Please Provide Left and Right Wrist Brace For Bilateral Hand Numbness That Occurs Every Night. Has Hx of Carpal Tunnel. 10)  Cvs Loratadine 10 Mg Tabs (Loratadine) .... Take One Tablet Daily As Needed Allergies 11)  Robaxin 500 Mg Tabs (Methocarbamol) .... Take One Tab Every 8 Hours As Needed Muscle Spasm.  This Can Make You Drowsy  Allergies (verified): No Known Drug Allergies  Review of Systems  The patient denies anorexia, weight loss, chest pain, and abdominal pain.    Physical Exam  General:  Well-developed,well-nourished,in no acute distress; alert,appropriate and cooperative throughout examination  overweight-appearing.     Detailed Back/Spine Exam  General:    scars at midline in Lumbar spine and cervical spine.  obese.    Gait:    Normal heel-toe gait pattern bilaterally.     Impression & Recommendations:  Problem # 1:  BACK PAIN, LUMBAR, CHRONIC (ICD-724.2) Assessment Deteriorated Pt has longstanding back pain.  Gave him instructions per Pt instruction sheet.  will refer to PT to eval and treat  to see if any improvement in strength will help.  Also, recommended weight loss and exercise.  WIll see in one month to see if improvement.  At that time will discuss BP and weight. His updated medication list for this problem includes:    Robaxin 500 Mg Tabs (Methocarbamol) .Marland Kitchen... Take one tab every 8 hours as needed muscle spasm.  this can make you drowsy  Orders: [facility]- Est Level  3 ([address]) Physical Therapy Referral (PT)  Complete Medication List: 1)  Neurontin 300 Mg Caps (Gabapentin) .... Take 2 capsule by mouth three times a day 2)  Prilosec 40 Mg Cpdr (Omeprazole) .... Take one tablet by mouth daily 3)  Prozac 40 Mg Caps (Fluoxetine hcl) .Marland Kitchen.. 1 capsule by mouth once a day 4)  Metoprolol Tartrate 50 Mg Tabs  (Metoprolol tartrate) .... Take one tablet two times a day 5)  Trazodone Hcl 100 Mg Tabs (Trazodone hcl) .... Takes 2 tablets at bedtime - prescribed by linda greninger, psych 6)  Abilify 5 Mg Tabs (Aripiprazole) .... Take one tablet by mouth daily 7)  Clobetasol Propionate 0.05 % Crea (Clobetasol propionate) .... Apply to psorisis on hands two times a day for 2 weeks. dispense 1 tube 8)  Proair Hfa 108 (90 Base) Mcg/act Aers (Albuterol sulfate) .... Inhale one puff every 4 hours as needed wheezing. 9)  2 Wrist Braces  .... Please provide left and right wrist brace for bilateral hand numbness that occurs every night. has hx of carpal tunnel. 10)  Cvs Loratadine 10 Mg Tabs (Loratadine) .... Take one tablet daily as needed allergies 11)  Robaxin 500 Mg Tabs (Methocarbamol) .... Take one tab every 8 hours as needed muscle spasm.  this can make you drowsy   Patient Instructions: 1)  Please schedule a follow-up appointment in 1 month.  we will talk about your weight and your blood pressure 2)  I am writing you a prescription for a muscle relaxant.  You should try to take this after you have tried 1. walking 2. heat 3. tylenol or advil.  Take the advil or tylenol when you start using the heating pad.  If after one hour, you don't feel better, try the muscle relaxant. 3)  ask at the [facility] if there is a beginning yoga class or water aerobics.  This might help your back.   4)  I will refer you to physical therapy. Prescriptions: ROBAXIN 500 MG TABS (METHOCARBAMOL) take one tab every 8 hours as needed muscle spasm.  This can make you drowsy  #30 x 0   Entered and Authorized by:   Ellery Plunk MD   Signed by:   Ellery Plunk MD on 03/08/2009   Method used:   [facility]  [address] [phone][facility] (retail)       [address]       [place], [place]  [address]       Ph: [phone]       Fax: [phone]   RxID:   [id number]

## 2010-03-19 NOTE — Consult Note (Signed)
Summary: [facility]- Discharge Summary  [facility]- Discharge Summary   Imported By: Clydell Hakim 06/28/2009 11:14:34  _____________________________________________________________________  External Attachment:    Type:   Image     Comment:   External Document

## 2010-03-19 NOTE — Assessment & Plan Note (Signed)
Summary: wants sperm test,df   Vital Signs:  Patient profile:   39 year old male Height:      72 inches Weight:      330.1 pounds BMI:     44.93 Temp:     98.1 degrees F oral Pulse rate:   69 / minute BP sitting:   144 / 84  (left arm) Cuff size:   large  Vitals Entered By: Garen Grams LPN (November 16, 2009 9:45 AM) CC: STD test, f/u bp Is Patient Diabetic? No Pain Assessment Patient in pain? no        Primary Care Provider:  Ellery Plunk MD  CC:  STD test and f/u bp.  History of Present Illness: STD- no sexual activity for years bt would like to be checked for STDs "just in case".  complaining of maybe having increased frequency of urination.  has been checked for HIV in the past and was negative.  no sexual activity since then.    HTN- lost weight since last visit.  walks 30-40 min/day, works in yard.  stopped drinking soda.  Chest pain-  never has pain with activity.  yesterday had chest pressure for 30 min while sitting down.  wasn't stressed at time.  felt nauseous but denied diaphoresis, SOB, arm or jaw pain.  went away without treatment.  Today pain reoccured with same pattern.    Habits & Providers  Alcohol-Tobacco-Diet     Alcohol drinks/day: 0     Tobacco Status: never     Diet Comments: discussed need to eat more fruit and vegetables  Current Medications (verified): 1)  Neurontin 300 Mg Caps (Gabapentin) .... Take 1 Capsule By Mouth Three Times A Day 2)  Prilosec 40 Mg  Cpdr (Omeprazole) .... Take One Tablet By Mouth Daily 3)  Prozac 40 Mg Caps (Fluoxetine Hcl) .Marland Kitchen.. 1 Capsule By Mouth Once A Day 4)  Metoprolol Tartrate 50 Mg  Tabs (Metoprolol Tartrate) .... Take One Tablet Two Times A Day 5)  Trazodone Hcl 100 Mg  Tabs (Trazodone Hcl) .... Takes 2 Tablets At Bedtime - Prescribed By Brock Bad, Psych 6)  Abilify 5 Mg  Tabs (Aripiprazole) .... Take One Tablet By Mouth Daily 7)  Clobetasol Propionate 0.05 %  Crea (Clobetasol Propionate) .... Apply To  Psorisis On Hands Two Times A Day For 2 Weeks. Dispense 1 Tube 8)  Proair Hfa 108 (90 Base) Mcg/act Aers (Albuterol Sulfate) .... Inhale One Puff Every 4 Hours As Needed Wheezing. 9)  2 Wrist Braces .... Please Provide Left and Right Wrist Brace For Bilateral Hand Numbness That Occurs Every Night. Has Hx of Carpal Tunnel. 10)  Zofran Odt 8 Mg Tbdp (Ondansetron) .... Place One Under Tongue and Allow To Dissolve Every 6-8 Hours As Needed Nausea  Allergies (verified): No Known Drug Allergies  Review of Systems       The patient complains of chest pain.  The patient denies anorexia, fever, and syncope.    Physical Exam  General:  VS reviewed.  comfortable appearing Head:  normocephalic and atraumatic.   Chest Wall:  tender to palpation over LSB, replicates his symptoms Lungs:  Normal respiratory effort, chest expands symmetrically. Lungs are clear to auscultation, no crackles or wheezes. Heart:  Normal rate and regular rhythm. S1 and S2 normal without gallop, murmur, click, rub or other extra sounds. Abdomen:  Bowel sounds positive,abdomen soft and non-tender without masses, organomegaly or hernias noted. obese Extremities:  no edema   Impression & Recommendations:  Problem # 1:  CHEST PAIN, ATYPICAL (ICD-786.59) Assessment New since replicated on exam with palpation most likely msk.  framingham risk calculator 2% ten year risk.  risk factors of well controlled HTN and obesity and relatively young age put him at low risk.  If this continues, would consider risk stratification with a treadmill test.  couselled extensively on when to seek medical attention and listed some things to look for on instructions. 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 continue to monitor.  pt doing well with diet and exercise. His updated medication list for this problem includes:    Metoprolol Tartrate 50 Mg Tabs (Metoprolol  tartrate) .Marland Kitchen... Take one tablet two times a day  Orders: [facility]- Est  Level 4 (60454)  Problem # 3:  DYSURIA (ICD-788.1) Assessment: New will check for STDs as pt would like to do so.  at very low risk.  since no pain with urination, just frequency, no U/A today.  could consider if symptoms persist. Orders: GC/Chlamydia-[facility] (87591/87491) [facility]- Est  Level 4 (09811)  Complete Medication List: 1)  Neurontin 300 Mg Caps (Gabapentin) .... Take 1 capsule by mouth three times a day 2)  Prilosec 40 Mg Cpdr (Omeprazole) .... Take one tablet by mouth daily 3)  Prozac 40 Mg Caps (Fluoxetine hcl) .Marland Kitchen.. 1 capsule by mouth once a day 4)  Metoprolol Tartrate 50 Mg Tabs (Metoprolol tartrate) .... Take one tablet two times a day 5)  Trazodone Hcl 100 Mg Tabs (Trazodone hcl) .... Takes 2 tablets at bedtime - prescribed by linda greninger, psych 6)  Abilify 5 Mg Tabs (Aripiprazole) .... Take one tablet by mouth daily 7)  Clobetasol Propionate 0.05 % Crea (Clobetasol propionate) .... Apply to psorisis on hands two times a day for 2 weeks. dispense 1 tube 8)  Proair Hfa 108 (90 Base) Mcg/act Aers (Albuterol sulfate) .... Inhale one puff every 4 hours as needed wheezing. 9)  2 Wrist Braces  .... Please provide left and right wrist brace for bilateral hand numbness that occurs every night. has hx of carpal tunnel. 10)  Zofran Odt 8 Mg Tbdp (Ondansetron) .... Place one under tongue and allow to dissolve every 6-8 hours as needed nausea  Patient Instructions: 1)  I will send you a letter with the results of your tests. 2)  I think that the pain you are having is likely muscle strain.  Please take 400mg  of ibuprofen every 8 hours for the next 3 days. 3)  If you have pain in your left side of chest with sweating, arm pain, dizziness, you need to be seen, either here or in the emergency room. 4)  please come back in 3 months for a check up of your blood pressure.   Prevention & Chronic Care Immunizations   Influenza  vaccine: Not documented   Influenza vaccine deferral: Not available  (11/16/2009)    Tetanus booster: 02/17/2001: Done.   Tetanus booster due: 02/18/2011    Pneumococcal vaccine: Not documented  Other Screening    Smoking status: never  (11/16/2009)  Hypertension   Last Blood Pressure: 144 / 84  (11/16/2009)   Serum creatinine: 0.95  (04/17/2009)   BMP action: Not indicated   Serum potassium 4.4  (04/17/2009)   Basic metabolic panel due: 05/08/2009    Hypertension flowsheet reviewed?: Yes   Progress toward BP goal: At goal  Lipids   Total Cholesterol: 160  (04/17/2009)  Lipid panel action/deferral: Lipid Panel ordered   LDL: 97  (04/17/2009)   LDL Direct: Not documented   HDL: 30  (04/17/2009)   Triglycerides: 166  (04/17/2009)   Lipid panel due: 04/18/2010    SGOT (AST): 38  (04/17/2009)   BMP action: Ordered   SGPT (ALT): 76  (04/17/2009)   Alkaline phosphatase: 71  (04/17/2009)   Total bilirubin: 0.7  (04/17/2009)    Lipid flowsheet reviewed?: Yes   Progress toward LDL goal: Improved  Self-Management Support :   Personal Goals (by the next clinic visit) :      Personal blood pressure goal: 140/90  (04/10/2009)     Personal LDL goal: 100  (04/10/2009)    Patient will work on the following items until the next clinic visit to reach self-care goals:     Medications and monitoring: take my medicines every day, bring all of my medications to every visit  (06/04/2009)     Eating: drink diet soda or water instead of juice or soda, eat more vegetables, use fresh or frozen vegetables, eat foods that are low in salt, eat baked foods instead of fried foods, eat fruit for snacks and desserts, limit or avoid alcohol  (06/04/2009)     Activity: take a 30 minute walk every day, take the stairs instead of the elevator, park at the far end of the parking lot  (06/04/2009)    Hypertension self-management support: Written self-care plan  (11/16/2009)   Hypertension self-care plan  printed.    Lipid self-management support: Written self-care plan  (11/16/2009)   Lipid self-care plan printed.

## 2010-03-19 NOTE — Progress Notes (Signed)
Summary: phn msg   Phone Note Call from Patient Call back at [phone]   Caller: Patient Summary of Call: Pt very insistant about getting MRI.  Wants Dr. Hulen Luster to call him. Initial call taken by: Clydell Hakim,  December 10, 2009 8:44 AM  Follow-up for Phone Call        pt called back and I read to him the message from last week.  made an appt for this Friday Follow-up by: De Nurse,  December 11, 2009 11:13 AM

## 2010-03-19 NOTE — Progress Notes (Signed)
Summary: dizziness with postural changes   Phone Note Call from Patient   Caller: Patient Summary of Call: Pt c/o dizziness upon standing.  He had a blood draw earlier today.  Denies any vertigo, N/V, or syncopal episode.  Advised to hold on metoprolol, drink plenty of fluids, and f/u in the morning to be evaluated.  Pt agreed. Initial call taken by: Marisue Ivan  MD,  April 17, 2009 6:38 PM

## 2010-03-19 NOTE — Progress Notes (Signed)
Summary: Ref Req   Phone Note Call from Patient Call back at Work Phone [phone]   Caller: Patient Summary of Call: Pt wants to be referred to Dr. Ave Filter [phone]. Initial call taken by: Clydell Hakim,  October 11, 2009 11:00 AM  Follow-up for Phone Call        can we find out who Dr. Ave Filter is and why he needs a referral? Follow-up by: Ellery Plunk MD,  October 13, 2009 3:08 PM     Appended Document: [facility]q [facility]  Advised they should be giving him a call soon, as urgent request was made 8.22.11

## 2010-03-19 NOTE — Progress Notes (Signed)
 Summary: triage   Phone Note Call from Patient Call back at Home Phone [phone]   Caller: Patient Summary of Call: pt has sore on his arm that pus keeps coming out of. Initial call taken by: Madelin Daring,  August 16, 2008 3:01 PM  Follow-up for Phone Call        L arm has sore x 1 week. has pus drainage-white. work in at [employer]. aware of wait time Follow-up by: Ginnie Mau RN,  August 16, 2008 3:05 PM  Additional Follow-up for Phone Call Additional follow up Details #1::        Thanks, noted. Additional Follow-up by: Debby Petties MD,  August 16, 2008 4:07 PM

## 2010-03-19 NOTE — Progress Notes (Signed)
Summary: triage   Phone Note Call from Patient Call back at Home Phone [phone]   Caller: Patient Summary of Call: having dizzy spells since yesterday Initial call taken by: De Nurse,  April 18, 2009 9:34 AM  Follow-up for Phone Call        dizzy when standing or sitting. comes & goes. states he is drinking lots of water. this started yesterday. denies falls or LOC.  he wants an appt for tomorrow am. appt made for 8:30.   advised drinking plenty of fluids. rise slowly & sit or stand until the dizzy feeling passes. hold onto wall or furniture until able to walk. take cell phone with him at all times.  states he lives alone & wopuld call 911 if he had a problem such as not being able to get up. Follow-up by: Golden Circle RN,  April 18, 2009 9:47 AM

## 2010-03-19 NOTE — Progress Notes (Signed)
 Summary: refill   Phone Note Refill Request Call back at Home Phone [phone] Message from:  Patient  Refills Requested: Medication #1:  CLOBETASOL PROPIONATE 0.05 %  CREA Apply to psorisis on hands two times a day for 2 weeks. Dispense 1 tube [facility]- [facility]  Initial call taken by: Karna Seminole,  August 31, 2008 11:14 AM    Prescriptions: CLOBETASOL PROPIONATE 0.05 %  CREA (CLOBETASOL PROPIONATE) Apply to psorisis on hands two times a day for 2 weeks. Dispense 1 tube  #1 x 0   Entered and Authorized by:   Debby Petties MD   Signed by:   Debby Petties MD on 08/31/2008   Method used:   Electronically to        [facility]  [address] [phone]* (retail)       [address]       [place], [place]  [address]       Ph: [phone]       Fax: [phone]   RxID:   [id number]

## 2010-03-19 NOTE — Progress Notes (Signed)
   Phone Note Outgoing Call   Call placed by: Milinda Antis MD,  December 14, 2009 5:52 PM Details for Reason: Knee pain Summary of Call: Pt called wanted to know if a muscle relaxant would help his knee pain. States the NSAIDS are not helping and he is taking Ibuprofen 400mg . I told Muscle relaxants are not needed for knee pain he can continue the NSAIDS and increase the ibuprofen to 600mg  every 6 hours. He has an appt with [facility], if his pain that severe he can call on Monday to be seen bya work in doctor. Voiced understanding

## 2010-03-19 NOTE — Progress Notes (Signed)
   Phone Note Call from Patient   Summary of Call: Caller: Patient Summary of Call: calling about his knee pain, says that it is pounding with heart beat.  knee looks red and swollen and pain is worse.  taking advil every 4 hours.  has appt with SM for u/s on Monday.  Advised that he use some ice and tylenol and call in AM to be seen to examine knee, make sure not acutely worse before his SM appt.   Initial call taken by: Ellery Plunk MD,  December 26, 2009 1:33 AM     Appended Document: Follow up call Knee is no better.  Still red and throbbing.  Tried to get him to come in this am to see Dr. Jennette Kettle but pt unable - relys on [employer] for transportation and already has arranged for pick up for another appt elsewhere today.  spoke with Dr. Jennette Kettle and she advises that he be seen at [facility] tomorrow by Dr. Dallas Schimke.  Will contact them to arrange for appt.  Dennison Nancy RN  December 26, 2009 9:11 AM

## 2010-03-19 NOTE — Progress Notes (Signed)
   Phone Note Call from Patient Call back at Work Phone [phone]   Caller: Patient Summary of Call: Pt stated that a pinch nerve behind his heart started to hurt when he lifted a little box.  Pt states it is getting better only last 1-2 minutes, not associated with nausea vomiting, sob, sweating, or radiating pain.  Pt states that if her pushes on his chest he can make th epain come back.  Pt states he fells better now but wanted to check.  Pt has not tried taking anything for it and has no heart problems.  Pt told that if concern he should be checked out at the ED.  Pt declined, pt wants to try motrin first.  Pt told this is allright but if the pain comes back or worsens he needs to get the ed immediately.  Pt voices understanding but states he thinks he will be fine.  Pt told he can follow up in clinic tomorrow.  Initial call taken by: Antoine Primas DO,  September 12, 2009 6:04 PM

## 2010-03-19 NOTE — Assessment & Plan Note (Signed)
Summary: hurt knees,df   Vital Signs:  Patient profile:   40 year old male Height:      72 inches Weight:      332 pounds BMI:     45.19 Temp:     98.0 degrees F oral Pulse rate:   78 / minute BP sitting:   161 / 94  (left arm) Cuff size:   large  Vitals Entered By: Jimmy Footman, CMA (November 21, 2009 2:07 PM) CC: left knee pain & swelling x5 days Is Patient Diabetic? No Pain Assessment Patient in pain? yes     Location: left knee Intensity: 10 Type: sharp   Primary Care Provider:  Ellery Plunk MD  CC:  left knee pain & swelling x5 days.  History of Present Illness: knee pain- saturday fell in a hole and twisted knee.  no immediate pain.  got up and walked.  sunday ws ok but monday started to have some swelling.  it was worse on tuesday so he came in.  able to walk but feels a little unsteady. has used midol and ice intermittently.  Current Medications (verified): 1)  Neurontin 300 Mg Caps (Gabapentin) .... Take 1 Capsule By Mouth Three Times A Day 2)  Prilosec 40 Mg  Cpdr (Omeprazole) .... Take One Tablet By Mouth Daily 3)  Prozac 40 Mg Caps (Fluoxetine Hcl) .Marland Kitchen.. 1 Capsule By Mouth Once A Day 4)  Metoprolol Tartrate 50 Mg  Tabs (Metoprolol Tartrate) .... Take One Tablet Two Times A Day 5)  Trazodone Hcl 100 Mg  Tabs (Trazodone Hcl) .... Takes 2 Tablets At Bedtime - Prescribed By Brock Bad, Psych 6)  Abilify 5 Mg  Tabs (Aripiprazole) .... Take One Tablet By Mouth Daily 7)  Clobetasol Propionate 0.05 %  Crea (Clobetasol Propionate) .... 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 Dispense 1 Tube 8)  Proair Hfa 108 (90 Base) Mcg/act Aers (Albuterol Sulfate) .... Inhale One Puff Every 4 Hours As Needed Wheezing. 9)  2 Wrist Braces .... Please Provide Left and Right Wrist Brace For Bilateral Hand Numbness That Occurs Every Night. Has Hx of Carpal Tunnel. 10)  Zofran Odt 8 Mg Tbdp (Ondansetron) .... Place One Under Tongue and Allow To Dissolve Every 6-8 Hours  As Needed Nausea  Allergies (verified): No Known Drug Allergies  Review of Systems  The patient denies anorexia, fever, and weight gain.    Physical Exam  General:  VS reviewed.  NAD   Knee Exam  Gait:    Normal heel-toe gait pattern bilaterally.    Skin:    intact  Inspection:    some fullness of left knee.  no ecchymosis  Palpation:    tenderness L-lateral joint line, tenderness L-Parapatellar, and tenderness L-patellar tendon.    Knee Exam:    Right:    Inspection:  Normal    Left:    Inspection:  Abnormal    Stability:  stable    Tenderness:  no    Swelling:  slight    Erythema:  no    Range of Motion:       Flexion-Active: full       Extension-Active: full       Flexion-Passive: full       Extension-Passive: full  Anterior drawer:    Left negative Posterior drawer:    Left negative Lachman :    Left negative MCL:    Left negative LCL:    Left negative   Impression &  Recommendations:  Problem # 1:  KNEE PAIN, ACUTE (ICD-719.46) Assessment New no joint laxity or any sign of something broken.  no xray today, would consider in one week if not better.  motrin and ice for 3 days.  gave knee sleeve. Orders: [facility]- Est Level  3 (96295)  Complete Medication List: 1)  Neurontin 300 Mg Caps (Gabapentin) .... Take 1 capsule by mouth three times a day 2)  Prilosec 40 Mg Cpdr (Omeprazole) .... Take one tablet by mouth daily 3)  Prozac 40 Mg Caps (Fluoxetine hcl) .Marland Kitchen.. 1 capsule by mouth once a day 4)  Metoprolol Tartrate 50 Mg Tabs (Metoprolol tartrate) .... Take one tablet two times a day 5)  Trazodone Hcl 100 Mg Tabs (Trazodone hcl) .... Takes 2 tablets at bedtime - prescribed by linda greninger, psych 6)  Abilify 5 Mg Tabs (Aripiprazole) .... Take one tablet by mouth daily 7)  Clobetasol Propionate 0.05 % Crea (Clobetasol propionate) .... Apply to psorisis on hands two times a day for 2 weeks. dispense 1 tube 8)  Proair Hfa 108 (90 Base) Mcg/act Aers  (Albuterol sulfate) .... Inhale one puff every 4 hours as needed wheezing. 9)  2 Wrist Braces  .... Please provide left and right wrist brace for bilateral hand numbness that occurs every night. has hx of carpal tunnel. 10)  Zofran Odt 8 Mg Tbdp (Ondansetron) .... Place one under tongue and allow to dissolve every 6-8 hours as needed nausea  Patient Instructions: 1)  Take advil or motrin 400mg  three times a day for the next 3 days.   2)  You may move around but avoid painful motions. Apply ice to sore area for 20 minutes 3-4 times a day for 2-3 days.  3)  Come back in one week if not better  Appended Document: hurt knees,df

## 2010-03-19 NOTE — Assessment & Plan Note (Signed)
Summary: Lance Luna   Vital Signs:  Patient profile:   40 year old male Height:      72 inches Weight:      331 pounds BP sitting:   158 / 103  Vitals Entered By: Rochele Pages RN (December 21, 2009 11:01 AM)  Primary Care Provider:  Ellery Plunk MD   History of Present Illness: 40 yo male here for knee pain left sided has been going on for 1 month. Pt states that it is constant pain seems to be worse with movement. Pt states injured itwhen he stepped in a hole when walking to his car did not fall directly on it but did have the knee swell soon afterward.  Pt is still able to bear weight but has not been able to wlak without a limp.  Pt states the knee has never given out and does though stil swell quite frequently. Pt states that motrin used to help the knee but does not help anymore.   Pt did have knee films that did not show any type of malfomation or even oesteoporsis. Please see chart.       Current Medications (verified): 1)  Neurontin 300 Mg Caps (Gabapentin) .... Take 1 Capsule By Mouth Three Times A Day 2)  Prilosec 40 Mg  Cpdr (Omeprazole) .... Take One Tablet By Mouth Daily 3)  Prozac 40 Mg Caps (Fluoxetine Hcl) .Marland Kitchen.. 1 Capsule By Mouth Once A Day 4)  Metoprolol Tartrate 50 Mg  Tabs (Metoprolol Tartrate) .... Take One Tablet Two Times A Day 5)  Trazodone Hcl 100 Mg  Tabs (Trazodone Hcl) .... Takes 2 Tablets At Bedtime - Prescribed By Brock Bad, Psych 6)  Abilify 5 Mg  Tabs (Aripiprazole) .... Take One Tablet By Mouth Daily 7)  Clobetasol Propionate 0.05 %  Crea (Clobetasol Propionate) .... 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 Dispense 1 Tube 8)  Proair Hfa 108 (90 Base) Mcg/act Aers (Albuterol Sulfate) .... Inhale One Puff Every 4 Hours As Needed Wheezing. 9)  2 Wrist Braces .... Please Provide Left and Right Wrist Brace For Bilateral Hand Numbness That Occurs Every Night. Has Hx of Carpal Tunnel. 10)  Zofran Odt 8 Mg Tbdp (Ondansetron) .... Place One Under  Tongue and Allow To Dissolve Every 6-8 Hours As Needed Nausea 11)  Meloxicam 7.5 Mg Tabs (Meloxicam) .... Take One By Mouth Daily.  Do Not Take Advil With This Medicine  Allergies (verified): No Known Drug Allergies  Past History:  Past medical, surgical, family and social histories (including risk factors) reviewed, and no changes noted (except as noted below).  Past Medical History: Reviewed history from 08/17/2008 and no changes required. 9/03 and 9/02 H.pylori positive - treated,  frequent visits to ED for various sx`s,  Generalized Anxiety Disorder, ?depression too,  mild L carpal tunnel, multiple episodes of muscle strains,   Back pain/neck pain - messed up disk in neck HTN GERD Pain Management - Dr. Laneta Simmers - prescribes Neurontin. Dr. Cardell Peach at [facility] - she prescribes Prozac and Trazadone. Sees conselor every week. Neurologist - Dr. Wynetta Emery  Headaches - Migraines Eczema Psoriasis  [facility] Chest pain 11/09 - Sleep study done showing mild to moderate sleep apnea - CPAP  03/2007 - Catherization by D. Hochrein was normal. EF 65%.  Normal wall motion.  Restrictive lung disease by PFTs in clinic 2009  Past Surgical History: Reviewed history from 05/26/2007 and no changes required. 09/14/01 neck surgery (discectomy) Dr.  Wynetta Emery - 11/04/2001 9/02 DG UGI - no ulcers - 11/12/2000 abnl EMG/nerve cond study- carpal tunnel - 12/18/2001 and 03/2006 Appendectomy - 10/15/2000 11/04 Decompressive lumbar decompressive laminectomy at L3-4 and L4-5  Family History: Reviewed history from 09/09/2006 and no changes required. mom-migraines No known heart problems or cancer or DM.  Social History: Reviewed history from 09/09/2006 and no changes required. 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 Lives alone. Family lives in [place] and parents won't talk to him. Spends time with the Jones family - his godparents - POA. [phone]. 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  Review of Systems       denies fever, chills, nausea, vomiting, diarrhea or constipation   Physical Exam  General:  vs reivewed.  well-developed, well-nourished, and well-hydrated.   Lungs:  Normal respiratory effort, chest expands symmetrically. Lungs are clear to auscultation, no crackles or wheezes. Heart:  Normal rate and regular rhythm. S1 and S2 normal without gallop, murmur, click, rub or other extra sounds. Abdomen:  Bowel sounds positive,abdomen soft and non-tender without masses, organomegaly or hernias noted. Obese Msk:  Left knee Negative anterior drawarer, posterior drawer, lachmans test neg bounce test + pain mild on medial joint line Mild decrease in passive ROM with flexioni of the knee stops at 100 degrees of flexion due to pain and mild effusion.  neg mcmurry's  Pulses:  2+ Extremities:  no edema Additional Exam:  Patient given informed consent for aspiration and steroid injection. Discussed possible complications of infection, bleeding or skin atrophy at site of injection. Possible side effect of avascular necrosis (focal area of bone death) due to steroid use.Appropriate verbal time out taken Are cleaned and prepped in usual sterile fashion. !0 cc 1% lidocaine without epinephrine was injected into the area medial suprapatellar space for anasthesia. A 60 cc syringe with an 18 guage 1 1/2 needle used from the suprapatellar approach to aspirate 40 cc of clear yellow jojnt fluid. The syringe was changed while the needle was retained in the joint using steril hemostats and 1 cc of kennalog 40 mg/ml wa introduced into  the joint---. Patient tolerated procedure well with no  complications.    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 His updated medication list for this problem includes:    Meloxicam 7.5 Mg Tabs (Meloxicam) .Marland Kitchen... Take one by mouth daily.  do not take advil , ibuprofen, aleve or naprosyn with this medicine  Orders: Joint Aspirate / Injection, Large (20610) Kenalog 10 mg inj (J3301)  Complete Medication List: 1)  Neurontin 300 Mg Caps (Gabapentin) .... Take 1 capsule by mouth three times a day 2)  Prilosec 40 Mg Cpdr (Omeprazole) .... Take one tablet by mouth daily 3)  Prozac 40 Mg Caps (Fluoxetine hcl) .Marland Kitchen.. 1 capsule by mouth once a day 4)  Metoprolol Tartrate 50 Mg Tabs (Metoprolol tartrate) .... Take one tablet two times a day 5)  Trazodone Hcl 100 Mg Tabs (Trazodone hcl) .... Takes 2 tablets at bedtime - prescribed by linda greninger, psych 6)  Abilify  5 Mg Tabs (Aripiprazole) .... Take one tablet by mouth daily 7)  Clobetasol Propionate 0.05 % Crea (Clobetasol propionate) .... Apply to psorisis on hands two times a day for 2 weeks. dispense 1 tube 8)  Proair Hfa 108 (90 Base) Mcg/act Aers (Albuterol sulfate) .... Inhale one puff every 4 hours as needed wheezing. 9)  2 Wrist Braces  .... Please provide left and right wrist brace for bilateral hand numbness that occurs every night. has hx of carpal tunnel. 10)  Zofran Odt 8 Mg Tbdp (Ondansetron) .... Place one under tongue and allow to dissolve every 6-8 hours as needed nausea 11)  Meloxicam 7.5 Mg Tabs (Meloxicam) .... Take one by mouth daily.  do not take advil with this medicine   Orders Added: 1)  Est. Patient Level III [16109] 2)  Joint Aspirate / Injection, Large [20610] 3)  Kenalog 10 mg inj [J3301]

## 2010-03-19 NOTE — Assessment & Plan Note (Signed)
Summary: FU VERTIGO/KH   Vital Signs:  Patient profile:   40 year old male Height:      72 inches Weight:      332.3 pounds BMI:     45.23 Temp:     98.2 degrees F oral Pulse rate:   71 / minute BP sitting:   119 / 78  (right arm) Cuff size:   large  Vitals Entered By: Garen Grams LPN (May 01, 2009 1:38 PM) CC: f/u vertigo Is Patient Diabetic? No Pain Assessment Patient in pain? no        Primary Care Provider:  Ellery Plunk MD  CC:  f/u vertigo.  History of Present Illness: pt here for f/u of BPPV diagnosed 2 weeks ago.  still having symptoms, taking meclazine which helps but makes him sleepy.  able to walk, just slowly.  no weakness no numbness no focal deficit no loss of bowel or bladder  Habits & Providers  Alcohol-Tobacco-Diet     Tobacco Status: never  Current Medications (verified): 1)  Neurontin 300 Mg Caps (Gabapentin) .... Take 1 Capsule By Mouth Three Times A Day 2)  Prilosec 40 Mg  Cpdr (Omeprazole) .... Take One Tablet By Mouth Daily 3)  Prozac 40 Mg Caps (Fluoxetine Hcl) .Marland Kitchen.. 1 Capsule By Mouth Once A Day 4)  Metoprolol Tartrate 50 Mg  Tabs (Metoprolol Tartrate) .... Take One Tablet Two Times A Day 5)  Trazodone Hcl 100 Mg  Tabs (Trazodone Hcl) .... Takes 2 Tablets At Bedtime - Prescribed By Brock Bad, Psych 6)  Abilify 5 Mg  Tabs (Aripiprazole) .... Take One Tablet By Mouth Daily 7)  Clobetasol Propionate 0.05 %  Crea (Clobetasol Propionate) .... 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 Dispense 1 Tube 8)  Proair Hfa 108 (90 Base) Mcg/act Aers (Albuterol Sulfate) .... Inhale One Puff Every 4 Hours As Needed Wheezing. 9)  2 Wrist Braces .... Please Provide Left and Right Wrist Brace For Bilateral Hand Numbness That Occurs Every Night. Has Hx of Carpal Tunnel. 10)  Cvs Loratadine 10 Mg Tabs (Loratadine) .... Take One Tablet Daily As Needed Allergies 11)  Meclizine Hcl 25 Mg Tabs (Meclizine Hcl) .Marland Kitchen.. 1 Tab By Mouth Three Times A Day  As Needed For Vertigo  Allergies (verified): No Known Drug Allergies  Review of Systems  The patient denies syncope, dyspnea on exertion, and headaches.    Physical Exam  General:  overweight-appearing.   Neurologic:  alert & oriented X3, cranial nerves II-XII intact, strength normal in all extremities, sensation intact to light touch, and gait normal.     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  pt ok with plan, aware of need to call with changing r worsening symptoms. His updated medication list for this problem includes:    Cvs Loratadine 10 Mg Tabs (Loratadine) .Marland Kitchen... Take one tablet daily as needed allergies    Meclizine Hcl 25 Mg Tabs (Meclizine hcl) .Marland Kitchen... 1 tab by mouth three times a day as needed for vertigo  Orders: Physical Therapy Referral (PT) [facility]- Est Level  3 (16109)  Complete Medication List: 1)  Neurontin 300 Mg Caps (Gabapentin) .... Take 1 capsule by mouth three times a day 2)  Prilosec 40 Mg Cpdr (Omeprazole) .... Take one tablet by mouth daily 3)  Prozac 40 Mg Caps (Fluoxetine hcl) .Marland KitchenMarland KitchenMarland Kitchen 1  capsule by mouth once a day 4)  Metoprolol Tartrate 50 Mg Tabs (Metoprolol tartrate) .... Take one tablet two times a day 5)  Trazodone Hcl 100 Mg Tabs (Trazodone hcl) .... Takes 2 tablets at bedtime - prescribed by linda greninger, psych 6)  Abilify 5 Mg Tabs (Aripiprazole) .... Take one tablet by mouth daily 7)  Clobetasol Propionate 0.05 % Crea (Clobetasol propionate) .... Apply to psorisis on hands two times a day for 2 weeks. dispense 1 tube 8)  Proair Hfa 108 (90 Base) Mcg/act Aers (Albuterol sulfate) .... Inhale one puff every 4 hours as needed wheezing. 9)  2 Wrist Braces  .... Please provide left and right wrist brace for bilateral hand numbness that occurs every night. has hx of carpal tunnel. 10)  Cvs  Loratadine 10 Mg Tabs (Loratadine) .... Take one tablet daily as needed allergies 11)  Meclizine Hcl 25 Mg Tabs (Meclizine hcl) .Marland Kitchen.. 1 tab by mouth three times a day as needed for vertigo

## 2010-03-19 NOTE — Progress Notes (Signed)
   Phone Note Call from Patient   Caller: Patient Call For: [phone] Summary of Call: Please call Mr. Schwer regarding xray results. Initial call taken by: Abundio Miu,  December 06, 2009 9:46 AM  Follow-up for Phone Call        will forward to MD. Follow-up by: Theresia Lo RN,  December 06, 2009 9:49 AM  Additional Follow-up for Phone Call Additional follow up Details #1::        called pt.  told him xrays clear.  he should stick to plan, use ice as needed and RTC in 2 weeks if necessary Additional Follow-up by: Ellery Plunk MD,  December 07, 2009 1:47 PM

## 2010-03-19 NOTE — Assessment & Plan Note (Signed)
Summary: BPPV- new dx   Vital Signs:  Patient profile:   40 year old male Height:      72 inches Weight:      332.3 pounds BMI:     45.23 Temp:     98.1 degrees F oral Pulse rate:   78 / minute BP sitting:   128 / 81  (left arm) Cuff size:   large  Vitals Entered By: Gladstone Pih (April 19, 2009 8:55 AM)  Serial Vital Signs/Assessments:  Time      Position  BP       Pulse  Resp  Temp     By                     122/90   78                    Marisue Ivan  MD                     120/86   76                    Marisue Ivan  MD                     126/84   78                    Marisue Ivan  MD  Comments: lying By: Marisue Ivan  MD  sitting By: Marisue Ivan  MD  standing By: Marisue Ivan  MD   CC: dizziness Is Patient Diabetic? No Pain Assessment Patient in pain? no        Primary Care Provider:  Ellery Plunk MD  CC:  dizziness.  History of Present Illness: 40yo M w/ dizziness  Dizziness: x 3 days.  No change in course.  Described as "the room is spinning".  Lasts from seconds to a few minutes (<10 min).  Occurs with changes in position and turning of the head.  No head trauma.  No prior hx of vertigo.  No fevers, vision changes, N/V, tinnitus, hearing loss, or headaches.  No new medication changes.    Habits & Providers  Alcohol-Tobacco-Diet     Tobacco Status: never  Current Medications (verified): 1)  Neurontin 300 Mg Caps (Gabapentin) .... Take 2 Capsule By Mouth Three Times A Day 2)  Prilosec 40 Mg  Cpdr (Omeprazole) .... Take One Tablet By Mouth Daily 3)  Prozac 40 Mg Caps (Fluoxetine Hcl) .Marland Kitchen.. 1 Capsule By Mouth Once A Day 4)  Metoprolol Tartrate 50 Mg  Tabs (Metoprolol Tartrate) .... Take One Tablet Two Times A Day 5)  Trazodone Hcl 100 Mg  Tabs (Trazodone Hcl) .... Takes 2 Tablets At Bedtime - Prescribed By Brock Bad, Psych 6)  Abilify 5 Mg  Tabs (Aripiprazole) .... Take One Tablet By Mouth Daily 7)  Clobetasol  Propionate 0.05 %  Crea (Clobetasol Propionate) .... 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 Dispense 1 Tube 8)  Proair Hfa 108 (90 Base) Mcg/act Aers (Albuterol Sulfate) .... Inhale One Puff Every 4 Hours As Needed Wheezing. 9)  2 Wrist Braces .... Please Provide Left and Right Wrist Brace For Bilateral Hand Numbness That Occurs Every Night. Has Hx of Carpal Tunnel. 10)  Cvs Loratadine 10 Mg Tabs (Loratadine) .... Take One Tablet Daily As Needed Allergies  Allergies (verified): No Known Drug Allergies  Review of Systems       No prior hx of vertigo.  No fevers, vision changes, N/V, tinnitus, hearing loss, or headaches.    Physical Exam  General:  VS Reviewed. Well appearing, NAD.  Obese Head:  atraumatic.   Eyes:  EOMI PERRLA vision grossly intact Neck:  supple, full ROM  Lungs:  Normal respiratory effort, chest expands symmetrically. Lungs are clear to auscultation, no crackles or wheezes. Heart:  Normal rate and regular rhythm. S1 and S2 normal without gallop, murmur, click, rub or other extra sounds. Neurologic:  alert & oriented X3, cranial nerves II-XII intact, and strength normal in all extremities.   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 He is no longer taking Robaxin.  Neurontin could contribute to symptoms but given the acute onset, I doubt this is the cause.   Positive Dix-Hallpike. No red flags on neuro exam.   Plan: Tx w/ supportive care w/ Meclizine.  Modified Epley's Maneuver provided.  Educated of condition.  Will titrate the neurontin down to 1 tab three times a day until symptoms resolve and then can consider titrating the neurontin back to the original dose. F/U in 1-2 weeks to reassess  symptoms.  His updated medication list for this problem includes:    Cvs Loratadine 10 Mg Tabs (Loratadine) .Marland Kitchen... Take one tablet daily as needed allergies    Meclizine Hcl 25 Mg Tabs (Meclizine hcl) .Marland Kitchen... 1 tab by mouth three times a day as needed for vertigo  Orders: [facility]- Est  Level 4 (45409)  Problem # 2:  HYPERTENSION, BENIGN SYSTEMIC (ICD-401.1) Assessment: Improved At goal (<140/90).  Orthostatics were negative.  Plan to have him restart the metoprolol. Don't think there is an association b/w the B-blocker and his symptoms.  His updated medication list for this problem includes:    Metoprolol Tartrate 50 Mg Tabs (Metoprolol tartrate) .Marland Kitchen... Take one tablet two times a day  Orders: [facility]- Est  Level 4 (81191)  Complete Medication List: 1)  Neurontin 300 Mg Caps (Gabapentin) .... Take 1 capsule by mouth three times a day 2)  Prilosec 40 Mg Cpdr (Omeprazole) .... Take one tablet by mouth daily 3)  Prozac 40 Mg Caps (Fluoxetine hcl) .Marland Kitchen.. 1 capsule by mouth once a day 4)  Metoprolol Tartrate 50 Mg Tabs (Metoprolol tartrate) .... Take one tablet two times a day 5)  Trazodone Hcl 100 Mg Tabs (Trazodone hcl) .... Takes 2 tablets at bedtime - prescribed by linda greninger, psych 6)  Abilify 5 Mg Tabs (Aripiprazole) .... Take one tablet by mouth daily 7)  Clobetasol Propionate 0.05 % Crea (Clobetasol propionate) .... Apply to psorisis on hands two times a day for 2 weeks. dispense 1 tube 8)  Proair Hfa 108 (90 Base) Mcg/act Aers (Albuterol sulfate) .... Inhale one puff every 4 hours as needed wheezing. 9)  2 Wrist Braces  .... Please provide left and right wrist brace for bilateral hand numbness that occurs every night. has hx of carpal tunnel. 10)  Cvs Loratadine 10 Mg Tabs (Loratadine) .... Take one tablet daily as needed allergies 11)  Meclizine Hcl 25 Mg Tabs (Meclizine hcl) .Marland Kitchen.. 1 tab by mouth three times a day as needed for vertigo  Patient Instructions: 1)  Follow up in 1-2  weeks to  reassess vertigo. 2)  I think you have a condition called BPPV.  Typically resolves on its own by 6 months.  I gave you a handout on the condition. 3)  Try the Epley maneuver to help resolve the symptoms. 4)  I gave you a medication called Meclizine to help with the symptoms. 5)  Decrease the neurontin to 1 tab three times a day to see if it helps. Prescriptions: MECLIZINE HCL 25 MG TABS (MECLIZINE HCL) 1 tab by mouth three times a day as needed for vertigo  #30 x 1   Entered and Authorized by:   Marisue Ivan  MD   Signed by:   Marisue Ivan  MD on 04/19/2009   Method used:   Electronically to        [facility] [id number]* (retail)       [address]       [place], [place]  [address]       Ph: [phone]       Fax: [phone]   RxID:   [id number]    Prevention & Chronic Care Immunizations   Influenza vaccine: Not documented    Tetanus booster: 02/17/2001: Done.   Tetanus booster due: 02/18/2011    Pneumococcal vaccine: Not documented  Other Screening   Smoking status: never  (04/19/2009)  Lipids   Total Cholesterol: 189  (03/13/2008)   Lipid panel action/deferral: Lipid Panel ordered   LDL: 135  (03/13/2008)   LDL Direct: Not documented   HDL: 30  (03/13/2008)   Triglycerides: 118  (03/13/2008)    SGOT (AST): Not documented   BMP action: Ordered   SGPT (ALT): Not documented   Alkaline phosphatase: Not documented   Total bilirubin: Not documented  Hypertension   Last Blood Pressure: 128 / 81  (04/19/2009)   Serum creatinine: 0.9  (03/31/2007)   BMP action: Not indicated   Serum potassium 3.9  (03/31/2007)   Basic metabolic panel due: 05/08/2009    Hypertension flowsheet reviewed?: Yes   Progress toward BP goal: At goal  Self-Management Support :   Personal Goals (by the next clinic visit) :      Personal blood pressure goal: 140/90  (04/10/2009)     Personal LDL goal: 100  (04/10/2009)    Patient will work on the following items  until the next clinic visit to reach self-care goals:     Medications and monitoring: take my medicines every day, check my blood pressure  (04/19/2009)     Activity: take a 30 minute walk every day  (04/10/2009)    Hypertension self-management support: Written self-care plan  (04/19/2009)   Hypertension self-care plan printed.    Lipid self-management support: Written self-care plan  (04/10/2009)

## 2010-03-19 NOTE — Progress Notes (Signed)
Summary: Emergency Line Call   Phone Note Call from Patient   Caller: Patient Summary of Call: Patient called Emergency Line complaining that right side of his throat hurts when he swallows.  Had tonsillectomy as a child.  States it started this morning and is continuing to worsen.  Able to tolerate po fluids and food, hurt worsened after eating some spicy foods.  Ate 3 BBQ sandwiches for dinner tonight.  Has not tried anything for relief.  No nausea/vomiting, fever, ear pain, cough.  Recommended OTC cough drops, Chloraseptic spray for relief.  Can call and make an appt if still continues to have sore throat.  Patient expressed understanding and agreed with plan.   Initial call taken by: Renold Don MD,  December 17, 2009 8:53 PM

## 2010-03-19 NOTE — Progress Notes (Signed)
Summary: sore throat   Phone Note Call from Patient Call back at [phone]   Caller: Patient Summary of Call: called concerning sore throat and difficulty talking.  able to keep down fluids easily and eat normally.  advised he call tomorrow in AM for appt for evaluation.   Initial call taken by: Ancil Boozer  MD,  June 03, 2009 6:18 PM

## 2010-03-19 NOTE — Assessment & Plan Note (Signed)
Summary: F/U  KH   Vital Signs:  Patient profile:   40 year old male Weight:      330.7 pounds Pulse rate:   76 / minute BP sitting:   129 / 87  (right arm)  Vitals Entered By: Arlyss Repress CMA, (December 04, 2009 1:33 PM) CC: f/up left knee pain. Is Patient Diabetic? No Pain Assessment Patient in pain? yes     Location: left knee Onset of pain  x 2 weeks   Primary Care Provider:  Ellery Plunk MD  CC:  f/up left knee pain.Marland Kitchen  History of Present Illness: left knee pain x 2 weeks.  using the sleeve and advil which is not helping.  pain is worse when walking up stairs.  Pain is better with rest.  when using sleeve has some knee swelling.  does not think that the pain has improved in the last 2 weeks.  injury with a ground level fall started the pain.   Habits & Providers  Alcohol-Tobacco-Diet     Tobacco Status: never  Current Medications (verified): 1)  Neurontin 300 Mg Caps (Gabapentin) .... Take 1 Capsule By Mouth Three Times A Day 2)  Prilosec 40 Mg  Cpdr (Omeprazole) .... Take One Tablet By Mouth Daily 3)  Prozac 40 Mg Caps (Fluoxetine Hcl) .Marland Kitchen.. 1 Capsule By Mouth Once A Day 4)  Metoprolol Tartrate 50 Mg  Tabs (Metoprolol Tartrate) .... Take One Tablet Two Times A Day 5)  Trazodone Hcl 100 Mg  Tabs (Trazodone Hcl) .... Takes 2 Tablets At Bedtime - Prescribed By Brock Bad, Psych 6)  Abilify 5 Mg  Tabs (Aripiprazole) .... Take One Tablet By Mouth Daily 7)  Clobetasol Propionate 0.05 %  Crea (Clobetasol Propionate) .... 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 Dispense 1 Tube 8)  Proair Hfa 108 (90 Base) Mcg/act Aers (Albuterol Sulfate) .... Inhale One Puff Every 4 Hours As Needed Wheezing. 9)  2 Wrist Braces .... Please Provide Left and Right Wrist Brace For Bilateral Hand Numbness That Occurs Every Night. Has Hx of Carpal Tunnel. 10)  Zofran Odt 8 Mg Tbdp (Ondansetron) .... Place One Under Tongue and Allow To Dissolve Every 6-8 Hours As Needed  Nausea 11)  Meloxicam 7.5 Mg Tabs (Meloxicam) .... Take One By Mouth Daily.  Do Not Take Advil With This Medicine  Allergies (verified): No Known Drug Allergies  Past History:  Past Surgical History: Last updated: 05/26/2007 09/14/01 neck surgery (discectomy) Dr. 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 Lives alone. Family lives in [place] and parents won't talk to him. Spends time with the Jones family - his godparents - POA. [phone]. 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  Review of Systems  The patient denies anorexia, fever, and prolonged cough.    Physical Exam  General:  vs reviewed.  well-developed, well-nourished, and well-hydrated.   Lungs:  Normal respiratory effort, chest expands symmetrically. Lungs are clear to auscultation, no crackles or wheezes. Heart:  Normal rate and regular rhythm. S1 and S2 normal without gallop, murmur, click, rub or other extra sounds. Abdomen:  Bowel sounds positive,abdomen soft and non-tender without masses, organomegaly or hernias noted.   Knee Exam  General:    obese.    Gait:    limp noted-left.    Skin:    healthy-R and healthy-L.    Inspection:     No deformity, ecchymosis or swelling.   Palpation:    tenderness L-medial joint line and tenderness L-lateral joint line.    Sensory:    Gross coordination and sensation were normal.    Knee Exam:    Left:     Inspection:  Normal    Palpation:  Normal    Stability:  stable    Tenderness:  no    Swelling:  no    Erythema:  no    full ROM    Impression & Recommendations:  Problem # 1:  KNEE PAIN, ACUTE (ICD-719.46) Assessment Deteriorated pain is the same or worse than last time.  benign knee exam.  will get xrays today to eval.  likely OA related.  pt's weight is probably related.  will switch to mobiq so can take q day.  consider knee injection next visit if not improved. His updated medication list for this problem includes:    Meloxicam 7.5 Mg Tabs (Meloxicam) .Marland Kitchen... Take one by mouth daily.  do not take advil with this medicine  Orders: Diagnostic X-Ray/Fluoroscopy (Diagnostic X-Ray/Flu) [facility]- Est  Level 4 ([address])  Complete Medication List: 1)  Neurontin 300 Mg Caps (Gabapentin) .... Take 1 capsule by mouth three times a day 2)  Prilosec 40 Mg Cpdr (Omeprazole) .... Take one tablet by mouth daily 3)  Prozac 40 Mg Caps (Fluoxetine hcl) .Marland Kitchen.. 1 capsule by mouth once a day 4)  Metoprolol Tartrate 50 Mg Tabs (Metoprolol tartrate) .... Take one tablet two times a day 5)  Trazodone Hcl 100 Mg Tabs (Trazodone hcl) .... Takes 2 tablets at bedtime - prescribed by linda greninger, psych 6)  Abilify 5 Mg Tabs (Aripiprazole) .... Take one tablet by mouth daily 7)  Clobetasol Propionate 0.05 % Crea (Clobetasol propionate) .... Apply to psorisis on hands two times a day for 2 weeks. dispense 1 tube 8)  Proair Hfa 108 (90 Base) Mcg/act Aers (Albuterol sulfate) .... Inhale one puff every 4 hours as needed wheezing. 9)  2 Wrist Braces  .... Please provide left and right wrist brace for bilateral hand numbness that occurs every night. has hx of carpal tunnel. 10)  Zofran Odt 8 Mg Tbdp (Ondansetron) .... Place one under tongue and allow to dissolve every 6-8 hours as needed nausea 11)  Meloxicam 7.5 Mg Tabs (Meloxicam) .... Take one by mouth daily.  do not take advil with this medicine  Patient  Instructions: 1)  It was nice to see you again. 2)  Please go the hospital for x rays.  I will call you with the results.   3)  Take meloxicam instead of advil.  You can also take tylenol if you need extra pain relief. 4)  Please come back in 2 weeks if not better. Prescriptions: MELOXICAM 7.5 MG TABS (MELOXICAM) take one by mouth daily.  Do not take advil with this medicine  #30 x 3   Entered and Authorized by:   Ellery Plunk MD   Signed by:   Ellery Plunk MD on 12/04/2009   Method used:   Electronically to  [facility] [address] [phone]* (retail)       [address]       [place], [place]  [address]       Ph: [phone]       Fax: [phone]   RxID:   [id number]    Orders Added: 1)  Diagnostic X-Ray/Fluoroscopy [Diagnostic X-Ray/Flu] 2)  [facility]- Est  Level 4 [[address]]  Appended Document: F/U  [facility]     Clinical Lists Changes  Orders: Added new Test order of [facility]- Est Level  3 ([address]) - Signed

## 2010-03-19 NOTE — Assessment & Plan Note (Signed)
Summary: f/u meds/eo   Vital Signs:  Patient profile:   40 year old male Height:      72 inches Weight:      339.5 pounds BMI:     46.21 Temp:     97.6 degrees F oral Pulse rate:   66 / minute BP sitting:   125 / 82  (left arm) Cuff size:   large  Vitals Entered By: Garen Grams LPN (September 10, 2009 10:12 AM) CC: psoriasis, obesity, HTN Is Patient Diabetic? No Pain Assessment Patient in pain? no        Primary Care Provider:  Ellery Plunk MD  CC:  psoriasis, obesity, and HTN.  History of Present Illness: psoriasis- is out of the steroid ointment he uses.  is currently having worsening over his extensor sufaces of fingers.  itching.  occasional soreness in joints of fingers, no swelling  obesity-worsened.  gaved 10 lbs since last visit.  has stopped exercising and has just recently gone back to the gym.  The [facility] people are trying to help him eat better but this week he has had [employer] pizza, 3 [employer] hamburgers (at one time), and [employer].  He understands he needs to eat less fried foods and increase vegetables  HTN- BP good today, taking meds as prescribed.  not watching salt intake.    Habits & Providers  Alcohol-Tobacco-Diet     Tobacco Status: never  Current Medications (verified): 1)  Neurontin 300 Mg Caps (Gabapentin) .... Take 1 Capsule By Mouth Three Times A Day 2)  Prilosec 40 Mg  Cpdr (Omeprazole) .... Take One Tablet By Mouth Daily 3)  Prozac 40 Mg Caps (Fluoxetine Hcl) .Marland Kitchen.. 1 Capsule By Mouth Once A Day 4)  Metoprolol Tartrate 50 Mg  Tabs (Metoprolol Tartrate) .... Take One Tablet Two Times A Day 5)  Trazodone Hcl 100 Mg  Tabs (Trazodone Hcl) .... Takes 2 Tablets At Bedtime - Prescribed By Brock Bad, Psych 6)  Abilify 5 Mg  Tabs (Aripiprazole) .... Take One Tablet By Mouth Daily 7)  Clobetasol Propionate 0.05 %  Crea (Clobetasol Propionate) .... 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 Dispense 1 Tube 8)  Proair Hfa 108 (90 Base) Mcg/act  Aers (Albuterol Sulfate) .... Inhale One Puff Every 4 Hours As Needed Wheezing. 9)  2 Wrist Braces .... Please Provide Left and Right Wrist Brace For Bilateral Hand Numbness That Occurs Every Night. Has Hx of Carpal Tunnel.  Allergies (verified): No Known Drug Allergies  Review of Systems       The patient complains of weight gain.  The patient denies anorexia, fever, vision loss, chest pain, syncope, prolonged cough, and abdominal pain.    Physical Exam  General:  vital signs reviewed.  obese. NAD Head:  Normocephalic and atraumatic without obvious abnormalities. No apparent alopecia or balding. Lungs:  Normal respiratory effort, chest expands symmetrically. Lungs are clear to auscultation, no crackles or wheezes. Heart:  Normal rate and regular rhythm. S1 and S2 normal without gallop, murmur, click, rub or other extra sounds. Abdomen:  Bowel sounds positive,abdomen soft and non-tender without masses, organomegaly or hernias noted. Extremities:  no edema on B LE Skin:  white scale and rash over extensor surfaces of digits bilaterally, the medial and lateral aspects worse than the center digits.  knees and elbows are clear, skin intact   Impression & Recommendations:  Problem # 1:  OBESITY, NOS (ICD-278.00) Assessment Deteriorated gained 10 lbs.  has reverted  to bad habits.  set goals for pt to lose 10lbs in 3 months.  gave instructions on better nutrition.  pt has goal of exercising 3-4x/week Orders: [facility]- Est  Level 4 ([address])  Problem # 2:  PSORIASIS (ICD-696.1) Assessment: Deteriorated refilled pt's ointment.  pt has been unable to go to dermatologist in the past.  pt has some tenderness in finger joints previously, but not now.  no visual deformity or swelling of joints.  will need to watch for this in the future. Orders: [facility]- Est  Level 4 ([address])  Problem # 3:  HYPERTENSION, BENIGN SYSTEMIC (ICD-401.1) Assessment: Improved pt taking his medication.  BP at goal.  this  medication should not affect electrolytes or kidney function.  continue to monitor.  refilled med. His updated medication list for this problem includes:    Metoprolol Tartrate 50 Mg Tabs (Metoprolol tartrate) .Marland Kitchen... Take one tablet two times a day  Orders: [facility]- Est  Level 4 ([address])  Complete Medication List: 1)  Neurontin 300 Mg Caps (Gabapentin) .... Take 1 capsule by mouth three times a day 2)  Prilosec 40 Mg Cpdr (Omeprazole) .... Take one tablet by mouth daily 3)  Prozac 40 Mg Caps (Fluoxetine hcl) .Marland Kitchen.. 1 capsule by mouth once a day 4)  Metoprolol Tartrate 50 Mg Tabs (Metoprolol tartrate) .... Take one tablet two times a day 5)  Trazodone Hcl 100 Mg Tabs (Trazodone hcl) .... Takes 2 tablets at bedtime - prescribed by linda greninger, psych 6)  Abilify 5 Mg Tabs (Aripiprazole) .... Take one tablet by mouth daily 7)  Clobetasol Propionate 0.05 % Crea (Clobetasol propionate) .... Apply to psorisis on hands two times a day for 2 weeks. dispense 1 tube 8)  Proair Hfa 108 (90 Base) Mcg/act Aers (Albuterol sulfate) .... Inhale one puff every 4 hours as needed wheezing. 9)  2 Wrist Braces  .... Please provide left and right wrist brace for bilateral hand numbness that occurs every night. has hx of carpal tunnel.  Patient Instructions: 1)  come back in 3 months 2)  avoid fast food or try to make healthy choices like grilled chicken 3)  your goal is to lose those 10 lbs 4)  get back to exercising. Prescriptions: CLOBETASOL PROPIONATE 0.05 %  CREA (CLOBETASOL PROPIONATE) Apply to psorisis on hands two times a day for 2 weeks. Dispense 1 tube  #1 x 1   Entered and Authorized by:   Ellery Plunk MD   Signed by:   Ellery Plunk MD on 09/10/2009   Method used:   Electronically to        [facility] [phone]* (retail)       [address]       [place], [place]  [address]       Ph: [phone]       Fax: [phone]   RxID:   [id number] METOPROLOL TARTRATE 50 MG  TABS (METOPROLOL  TARTRATE) Take one tablet two times a day  #60 x 6   Entered and Authorized by:   Ellery Plunk MD   Signed by:   Ellery Plunk MD on 09/10/2009   Method used:   Electronically to        [facility] [phone]* (retail)       [address]       [place], [place]  [address]       Ph: [phone]       Fax: [phone]   RxID:   [id number] PRILOSEC 40  MG  CPDR (OMEPRAZOLE) Take one tablet by mouth daily  #30 x 6   Entered and Authorized by:   Ellery Plunk MD   Signed by:   Ellery Plunk MD on 09/10/2009   Method used:   Electronically to        [facility] [phone]* (retail)       [address]       [place], [place]  [address]       Ph: [phone]       Fax: [phone]   RxID:   [id number]   Prevention & Chronic Care Immunizations   Influenza vaccine: Not documented   Influenza vaccine deferral: Deferred  (09/10/2009)    Tetanus booster: 02/17/2001: Done.   Tetanus booster due: 02/18/2011    Pneumococcal vaccine: Not documented  Other Screening   Smoking status: never  (09/10/2009)  Lipids   Total Cholesterol: 160  (04/17/2009)   Lipid panel action/deferral: Lipid Panel ordered   LDL: 97  (04/17/2009)   LDL Direct: Not documented   HDL: 30  (04/17/2009)   Triglycerides: 166  (04/17/2009)   Lipid panel due: 04/18/2010    SGOT (AST): 38  (04/17/2009)   BMP action: Ordered   SGPT (ALT): 76  (04/17/2009)   Alkaline phosphatase: 71  (04/17/2009)   Total bilirubin: 0.7  (04/17/2009)    Lipid flowsheet reviewed?: Yes   Progress toward LDL goal: At goal    Stage of readiness to change (lipid management): Preparation  Hypertension   Last Blood Pressure: 125 / 82  (09/10/2009)   Serum creatinine: 0.95  (04/17/2009)   BMP action: Not indicated   Serum potassium 4.4  (04/17/2009)   Basic metabolic panel due: 05/08/2009    Hypertension flowsheet reviewed?: Yes   Progress toward BP goal: At goal    Stage of readiness to change  (hypertension management): Maintenance  Self-Management Support :   Personal Goals (by the next clinic visit) :      Personal blood pressure goal: 140/90  (04/10/2009)     Personal LDL goal: 100  (04/10/2009)    Hypertension self-management support: Written self-care plan  (06/04/2009)    Lipid self-management support: Written self-care plan  (06/04/2009)

## 2010-03-19 NOTE — Letter (Signed)
Summary: AARP/Medicare Plans  AARP/Medicare Plans   Imported By: Marily Memos 01/09/2010 09:36:31  _____________________________________________________________________  External Attachment:    Type:   Image     Comment:   External Document

## 2010-03-19 NOTE — Assessment & Plan Note (Signed)
Summary: dental problem/ls   Vital Signs:  Patient profile:   40 year old male Height:      72 inches Weight:      337.2 pounds BMI:     45.90 Temp:     98.4 degrees F oral Pulse rate:   91 / minute BP sitting:   130 / 84  (left arm) Cuff size:   large  Vitals Entered By: Garen Grams LPN (October 05, 2009 3:50 PM) CC: needs dental referral Is Patient Diabetic? No Pain Assessment Patient in pain? yes     Location: tooth   Primary Care Provider:  Ellery Plunk MD  CC:  needs dental referral.  History of Present Illness: 40 yo male, here for work-in appt, due to dental pain. Pt reports recent history of multiple teeth breaking and pain thruout his mouth.  No trauma or recent injury.  Has not been able to afford to see a dentist in >5+ years.  Able to tolerate by mouth intake, but has sensativity to cold and hot.  No fevers, no abscesses.   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 Take 1 Capsule By Mouth Three Times A Day 2)  Prilosec 40 Mg  Cpdr (Omeprazole) .... Take One Tablet By Mouth Daily 3)  Prozac 40 Mg Caps (Fluoxetine  Hcl) .Marland Kitchen.. 1 Capsule By Mouth Once A Day 4)  Metoprolol Tartrate 50 Mg  Tabs (Metoprolol Tartrate) .... Take One Tablet Two Times A Day 5)  Trazodone Hcl 100 Mg  Tabs (Trazodone Hcl) .... Takes 2 Tablets At Bedtime - Prescribed By Brock Bad, Psych 6)  Abilify 5 Mg  Tabs (Aripiprazole) .... Take One Tablet By Mouth Daily 7)  Clobetasol Propionate 0.05 %  Crea (Clobetasol Propionate) .... 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 Dispense 1 Tube 8)  Proair Hfa 108 (90 Base) Mcg/act Aers (Albuterol Sulfate) .... Inhale One Puff Every 4 Hours As Needed Wheezing. 9)  2 Wrist Braces .... Please Provide Left and Right Wrist Brace For Bilateral Hand Numbness That Occurs Every Night. Has Hx of Carpal Tunnel. 10)  Amoxicillin 250 Mg Caps (Amoxicillin) .... Take 1 Pill By Mouth Three Times A Day X 7 Days 11)  Ibuprofen 600 Mg Tabs (Ibuprofen) .... Take 1 Pill By Mouth Q8hrs As Needed Pain  Allergies (verified): No Known Drug Allergies  Past History:  Past Medical History: Last updated: 08/17/2008 9/03 and 9/02 H.pylori positive - treated,  frequent visits to ED for various sx`s,  Generalized Anxiety Disorder, ?depression too,  mild L carpal tunnel, multiple episodes of muscle strains,   Back pain/neck pain - messed up disk in neck HTN  GERD Pain Management - Dr. Laneta Simmers - prescribes Neurontin. Dr[facility] Cardell Peach at [facility] - she prescribes Prozac and Trazadone. Sees conselor every week. Neurologist - Dr. Wynetta Emery  Headaches - Migraines Eczema Psoriasis  [facility] Chest pain 11/09 - Sleep study done showing mild to moderate sleep apnea - CPAP  03/2007 - Catherization by D. Hochrein was normal. EF 65%.  Normal wall motion.  Restrictive lung disease by PFTs in clinic 2009  Past Surgical History: Last updated: 05/26/2007 09/14/01 neck surgery (discectomy) Dr. Wynetta Emery - 11/04/2001 9/02 DG UGI - no ulcers - 11/12/2000 abnl EMG/nerve cond study- carpal tunnel - 12/18/2001 and  03/2006 Appendectomy - 10/15/2000 11/04 Decompressive lumbar decompressive laminectomy at L3-4 and L4-5  Family History: Last updated: 09/09/2006 mom-migraines No known heart problems or cancer or DM.  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 Lives alone. Family lives in [place] and parents won't talk to him. Spends time with the Jones family - his godparents - POA. [phone]. 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  Risk Factors: Alcohol Use: 0 (04/10/2009) Diet: discussed need to eat more fruit and vegetables (04/10/2009) Exercise: yes (04/10/2009)  Risk Factors: Smoking Status: never (10/05/2009)  Review of Systems  The patient denies fever and weight loss.    Physical Exam  General:  Vs reviewed, well-developed and well-hydrated.   Mouth:  poor dentition and teeth missing.  multiple fillings noted, some chipped in half Lungs:  Normal respiratory effort, chest expands symmetrically. Lungs are clear to auscultation, no crackles or wheezes. Heart:  Normal rate and regular rhythm. S1 and S2 normal without gallop, murmur, click, rub or other extra sounds.   Impression & Recommendations:  Problem # 1:  BROKEN TOOTH (AVW-098.11) Assessment New referr to dentist.  Will treat with 7 days of Abx while referral is pending.  ibuprofen for pain.  Orders: Dental Referral (Dentist) [facility]- Est Level  3 ([address])  Complete Medication List: 1)  Neurontin 300 Mg Caps (Gabapentin) .... Take 1 capsule by mouth three times a day 2)  Prilosec 40 Mg Cpdr (Omeprazole) .... Take one tablet by mouth daily 3)  Prozac 40 Mg Caps (Fluoxetine hcl) .Marland Kitchen.. 1  capsule by mouth once a day 4)  Metoprolol Tartrate 50 Mg Tabs (Metoprolol tartrate) .... Take one tablet two times a day 5)  Trazodone Hcl 100 Mg Tabs (Trazodone hcl) .... Takes 2 tablets at bedtime - prescribed by linda greninger, psych 6)  Abilify 5 Mg Tabs (Aripiprazole) .... Take one tablet by mouth daily 7)  Clobetasol Propionate 0.05 % Crea (Clobetasol propionate) .... Apply to psorisis on hands two times a day for 2 weeks. dispense 1 tube 8)  Proair Hfa 108 (90 Base) Mcg/act Aers (Albuterol sulfate) .... Inhale one puff every 4 hours as needed wheezing. 9)  2 Wrist Braces  .... Please provide left and right wrist brace for bilateral hand numbness that occurs every night. has hx of carpal tunnel. 10)  Amoxicillin 250 Mg Caps (Amoxicillin) .... Take 1 pill by mouth three times a day x 7 days 11)  Ibuprofen 600 Mg Tabs (Ibuprofen) .... Take 1 pill by mouth q8hrs as needed pain  Patient Instructions: 1)  Nice  to meet you! 2)  We will schedule an appointment for you to see the dentist.  We will call you next week with the date and time. 3)  Please take amoxicillin and Ibuprofen 4)  If pain worsenes, RTC Prescriptions: IBUPROFEN 600 MG TABS (IBUPROFEN) take 1 pill by mouth Q8hrs as needed pain  #21 x 1   Entered and Authorized by:   Alvia Grove DO   Signed by:   Alvia Grove DO on 10/05/2009   Method used:   Electronically to        [facility] [phone]* (retail)       [address]       [place], [place]  [address]       Ph: [phone]       Fax: [phone]   RxID:   [id number] AMOXICILLIN 250 MG CAPS (AMOXICILLIN) take 1 pill by mouth three times a day x 7 days  #21 x 0   Entered and Authorized by:   Alvia Grove DO   Signed by:   Alvia Grove DO on 10/05/2009   Method used:   Electronically to        [facility] [phone]* (retail)       [address]       [place], [place]  [address]       Ph: [phone]       Fax: [phone]   RxID:    [id number]

## 2010-03-19 NOTE — Progress Notes (Signed)
   Phone Note Call from Patient Call back at Work Phone [phone]   Summary of Call: Call from patient regarding left knee pain worsening since weekend - given cortisone injection for chronic mensical injury / osteoarthritis. on Friday at Sports Medicine. Patient advised to try over the counter pain medications and to elevate leg and to call for appointment in morning to be seen. Denies fevers, chills, nausea, emesis.  Initial call taken by: Bobby Rumpf  MD,  December 24, 2009 8:15 PM

## 2010-03-19 NOTE — Letter (Signed)
Summary: Generic Letter  [name]  [address]   [place], [place] [address]   Phone: [phone]  Fax: [phone]    11/20/2009  Lance Luna [address] [place], [place]  [address]  Dear [name],       I wanted to let you know that your lab tests were negative.  Everything looked fine.  Call the office if you have any questions.    Sincerely,   Ellery Plunk MD  Appended Document: Generic Letter mailed

## 2010-03-19 NOTE — Progress Notes (Signed)
Summary: triage  Medications Added ZOFRAN ODT 8 MG TBDP (ONDANSETRON) place one under tongue and allow to dissolve every 6-8 hours as needed nausea       Phone Note Call from Patient Call back at Work Phone [phone]   Caller: Patient Summary of Call: needs meds for pain Initial call taken by: De Nurse,  October 08, 2009 3:29 PM  Follow-up for Phone Call        states he has neck pain going up into his head.  says the ibuprofen does not help because he throws it back up. has not gotten the antibiotic yet for his tooth. wants something else for the pain. refused appt. states he has no way to get back here. told him I will check with his md & call him when she responds. urged him to get & start the antibiotic Follow-up by: Golden Circle RN,  October 08, 2009 3:30 PM  Additional Follow-up for Phone Call Additional follow up Details #1::        I didn't see him for the tooth.  will forward to bonnie.  however, sounds like we could give him zofran.  i will put that in Additional Follow-up by: Ellery Plunk MD,  October 09, 2009 9:06 AM    New/Updated Medications: ZOFRAN ODT 8 MG TBDP (ONDANSETRON) place one under tongue and allow to dissolve every 6-8 hours as needed nausea Prescriptions: ZOFRAN ODT 8 MG TBDP (ONDANSETRON) place one under tongue and allow to dissolve every 6-8 hours as needed nausea  #12 x 1   Entered and Authorized by:   Ellery Plunk MD   Signed by:   Ellery Plunk MD on 10/09/2009   Method used:   Electronically to        [facility] [phone]* (retail)       [address]       [place], [place]  [address]       Ph: [phone]       Fax: [phone]   RxID:   [id number]  I am not comfortable giving this patient any narcotics for pain.  That would be a decision that needs to be made by PCP.  Alvia Grove

## 2010-03-19 NOTE — Assessment & Plan Note (Signed)
Summary: Lance Luna,Lance Luna   Vital Signs:  Patient profile:   40 year old male Pulse rate:   105 / minute BP sitting:   145 / 86  (right arm)  Vitals Entered By: Rochele Pages RN (January 08, 2010 1:31 PM) CC: f/u left knee   Primary Provider:  Ellery Plunk MD  CC:  f/u left knee.  History of Present Illness: 40yo male to office for f/u Lance knee.  Symptoms started following a fall when he stepped in a whole in October. Continues to have persistant Luna all thru the knee, no improvement with aspiration & steroid injection 12/21/09. Has continued swelling of the knee. Started to get mechanical symptoms with some clicking & catching, denies any locking. Knee also feeling unstable with activity. Has increased Luna when standing for long periods of time.  Has been limping due to Luna. No previous knee injuries or surgeries. Using vicodin for Luna. Wearing brace which is not helping much. X-rays in october were negative.  No hx of MRI.   Allergies: No Known Drug Allergies PMH-FH-SH reviewed for relevance  Review of Systems      See HPI  Physical Exam  General:  Well-developed,well-nourished,in no acute distress; alert,appropriate and cooperative throughout examination Msk:  KNEE: - Left knee:  Small effusion.  ROM 0-100 with Luna at terminal flexion.   TTP along medial & lateral joint lines, no TTP along patella, patellar tendon, quad tendon. Significant medial Luna with McMurrays.  (+)Thessaly. Neg Lachmans, anterior & posterior drawer. Neg varus/valgus stress testing. - R knee with full ROM without Luna, swelling, tenderness, laxity, weakness.  GAIT: walking with antalgic gait favoring Lance leg Pulses:  +2/4 DP & PT b/Lance Neurologic:  sensation intact to light touch.     Impression & Recommendations:  Problem # 1:  KNEE Luna, ACUTE (ICD-719.46) Assessment Unchanged - Persistent Lance Luna with increasing mechanical symptoms, no improvement over past 3-wks with conservative  measuring including aspiraction/steroid injection, knee brace, and medications. - Schedule for MRI Lance knee to r/o meniscal tear or other pathology in the knee.  Mechanism of injury with stepping in a hole & twisting knee highly suggestive of meniscal tear - Cont. to use knee brace as directed - May cont. Vicodin as needed - MRI scheduled for Tues. 01/15/10, will call with results & discuss further tx options at that time. - Pt encouraged to call with any questions or concerns.  His updated medication list for this problem includes:    Meloxicam 7.5 Mg Tabs (Meloxicam) .Marland Kitchen... Take one by mouth daily.  do not take advil with this medicine    Vicodin Es 7.5-750 Mg Tabs (Hydrocodone-acetaminophen) .Marland Kitchen... Take 1 every 4 hours as needed  Orders: MRI (MRI)  Complete Medication List: 1)  Neurontin 300 Mg Caps (Gabapentin) .... Take 1 capsule by mouth three times a day 2)  Prilosec 40 Mg Cpdr (Omeprazole) .... Take one tablet by mouth daily 3)  Prozac 40 Mg Caps (Fluoxetine hcl) .Marland Kitchen.. 1 capsule by mouth once a day 4)  Metoprolol Tartrate 50 Mg Tabs (Metoprolol tartrate) .... Take one tablet two times a day 5)  Trazodone Hcl 100 Mg Tabs (Trazodone hcl) .... Takes 2 tablets at bedtime - prescribed by linda greninger, psych 6)  Abilify 5 Mg Tabs (Aripiprazole) .... Take one tablet by mouth daily 7)  Clobetasol Propionate 0.05 % Crea (Clobetasol propionate) .... Apply to psorisis on hands two times a day for 2 weeks. dispense 1 tube 8)  Proair Hfa 108 (90 Base) Mcg/act Aers (Albuterol sulfate) .... Inhale one puff every 4 hours as needed wheezing. 9)  2 Wrist Braces  .... Please provide left and right wrist brace for bilateral hand numbness that occurs every night. has hx of carpal tunnel. 10)  Zofran Odt 8 Mg Tbdp (Ondansetron) .... Place one under tongue and allow to dissolve every 6-8 hours as needed nausea 11)  Meloxicam 7.5 Mg Tabs (Meloxicam) .... Take one by mouth daily.  do not take advil with this  medicine 12)  Vicodin Es 7.5-750 Mg Tabs (Hydrocodone-acetaminophen) .... Take 1 every 4 hours as needed  Patient Instructions: 1)  Your MRI appointment is scheduled for 01/15/10 at 2:30 pm at [facility].  Authorization # [id number]   Orders Added: 1)  MRI [MRI] 2)  Est. Patient Level IV [[address]]

## 2010-03-19 NOTE — Progress Notes (Signed)
Summary: triage   Phone Note Call from Patient Call back at [phone]   Caller: Patient Summary of Call: has a broken tooth and needs to get in to see dentist Initial call taken by: De Nurse,  March 09, 2009 10:04 AM  Follow-up for Phone Call        broke tooth a few months ago. hurts at gum line. no swelling. has Medicare & cannot find a dentist. states he has another tooth that has to come out. told him [facility] is only taking emergency pts. thos on antibiotics for infection & pain meds. he does not qualify at this time. they are not taking names for a waiting list as they are already booked for the next 6 months. advised swish & spit warm salt water to help sore gum. gave name & number to clinic on [address] that will pull his tooth for $80. told him it is the first 40 people who are there by 6:30am. most arrive by 5:30. he will investigate this. told him he can go thru phone book & call  each dentist until he finds one that will take medicare. he agreed with plan Follow-up by: Golden Circle RN,  March 09, 2009 10:10 AM

## 2010-03-19 NOTE — Progress Notes (Signed)
Summary: Phone Call:  Knee MRI results   Phone Note Call from Patient   Caller: Patient Action Taken: Phone Call Completed Details of Action Taken: See comments below Summary of Call: Pt called requesting results of MRI.  Advised Dr. Christella Hartigan will be in office tomorrow, and either he or I will call back with resutls.  Spoke to pt on phone 01/16/10 @ 18:10.  Informed him MRI showed only signs of mild degenerative changes, otherwise no signs of meniscal or ligamentous injury.  Patient still having significant pain that is constant.  Continues to limp.  Taking Vicodin which helps minimally.  Is wearing his knee brace, but feels this is causing increased swelling at times.  He has not done physical therapy & is not interested in this.  Explained that based on MRI there does not appear to be anything that requires surgery.  He would like to come back to the office for a follow-up, encouraged him to call office in the AM to schedule f/u.  Pt expressed understanding & agreement of the above. Initial call taken by: Rochele Pages RN,  January 16, 2010 11:21 AM

## 2010-03-19 NOTE — Progress Notes (Signed)
Summary: Ref Req   Phone Note Call from Patient   Caller: Patient Summary of Call: Pt now wanting to get a MRI of his knee and wondering if it can be arranged. Initial call taken by: Clydell Hakim,  December 07, 2009 2:05 PM  Follow-up for Phone Call        MRI not indicated.  Pt should come back in 1-2 week if knee pain no better.  as I told him 1 hour ago Follow-up by: Ellery Plunk MD,  December 07, 2009 2:59 PM  Additional Follow-up for Phone Call Additional follow up Details #1::        Left message on vm informing pt of above. Additional Follow-up by: Garen Grams LPN,  December 07, 2009 3:41 PM

## 2010-03-19 NOTE — Progress Notes (Signed)
Summary: Vertigo   Phone Note Call from Patient Call back at [phone]   Caller: Patient Summary of Call: Complaining of vertigo.  Seen for this on 03/15.  Medicine not helping.  Wonders what else he can take.  Advised cannot prescribe over the phone, should call for appt in the morning or go to Urgent Care now if necessary. Initial call taken by: Romero Belling MD,  May 06, 2009 3:46 PM

## 2010-03-19 NOTE — Assessment & Plan Note (Signed)
 Summary: cpe,df   Vital Signs:  Patient profile:   40 year old male Height:      72 inches Weight:      322.6 pounds BMI:     43.91 Temp:     97.8 degrees F oral Pulse rate:   62 / minute BP sitting:   133 / 85  (left arm)  Vitals Entered By: Letitia Reusing (October 10, 2008 8:54 AM) CC: CPE Is Patient Diabetic? No   Primary Care Provider:  DELON BOWMAN MD  CC:  CPE.  History of Present Illness: 88M with Mild MR, obesity, mood disorder, HTN, hyperlipidemia here for CPE and fu of these.  MR:  Mild, able to do ADLs, has Case Manager who helps him shop for groceries.  Obesity:  Has seen nutritionist once, list of acceptable foods printed, he cont to gain weight, 322 lbs today, has not been following list and gets whatever he wants from grocery store.  Case Manager stll helps per pt.  Exercises by walking up and down 10 flights of steps several times a day.  Mood disorder:  Anxiety/depression, on Ability, Fluoxetine , trazodone, no complaints.  HTN:  Takes toprol as directed. BP ok today.  HLD:  last lipid panel uncontrolled in Jan.  Due for another but not fasting today.  Habits & Providers  Alcohol-Tobacco-Diet     Tobacco Status: never  Allergies: No Known Drug Allergies  Past History:  Past Medical History: Last updated: 08/17/2008 9/03 and 9/02 H.pylori positive - treated,  frequent visits to ED for various sx`s,  Generalized Anxiety Disorder, ?depression too,  mild L carpal tunnel, multiple episodes of muscle strains,   Back pain/neck pain - messed up disk in neck HTN GERD Pain Management - Dr. Lucas - prescribes Neurontin . Dr. Dorthey at [facility] - she prescribes Prozac  and Trazadone. Sees conselor every week. Neurologist - Dr. Onetha  Headaches - Migraines Eczema Psoriasis  MSK Chest pain 11/09 - Sleep study done showing mild to moderate sleep apnea - CPAP  03/2007 - Catherization by D. Hochrein was normal. EF 65%.  Normal wall  motion.  Restrictive lung disease by PFTs in clinic 2009  Past Surgical History: Last updated: 05/26/2007 09/14/01 neck surgery (discectomy) Dr. Onetha - 11/04/2001 9/02 DG UGI - no ulcers - 11/12/2000 abnl EMG/nerve cond study- carpal tunnel - 12/18/2001 and 03/2006 Appendectomy - 10/15/2000 11/04 Decompressive lumbar decompressive laminectomy at L3-4 and L4-5  Family History: Last updated: 09/09/2006 mom-migraines No known heart problems or cancer or DM.  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 Lives alone. Family lives in [place] and parents won't talk to him. Spends time with the Jones family - his godparents - POA. [phone]. Every wednesday someone comes and works with him - tree surgeon, medicine, grocery store help.  Review of Systems       See HPI  Physical Exam  General:  Well-developed,well-nourished,in no acute distress; alert,appropriate and cooperative throughout examination Head:  Normocephalic and atraumatic without obvious abnormalities. Male pattern balding. Eyes:  No corneal or conjunctival inflammation noted. EOMI. Perrla. Ears:  External ear exam shows no significant lesions or deformities.  Otoscopic examination reveals impaction of cerumen bilaterally. Nose:  External nasal examination shows no deformity  or inflammation. Nasal mucosa are pink and moist without lesions or exudates. Mouth:  Oral mucosa and oropharynx without lesions or exudates.  Neck:  No deformities, masses, or tenderness noted.  Obese. Lungs:  Normal respiratory effort, chest expands symmetrically. Lungs are clear to auscultation, no crackles or wheezes. Heart:  Normal rate and regular rhythm. S1 and S2  normal without gallop, murmur, click, rub or other extra sounds. Abdomen:  Obese, NT/ND, +BS, no masses, striae noted. Genitalia:  Testes bilaterally descended without nodularity, tenderness or masses. No scrotal masses or lesions. No penis lesions or urethral discharge. Pulses:  R and L carotid,radial,femoral,dorsalis pedis and posterior tibial pulses are full and equal bilaterally Extremities:  psoriatic plaques noted on hands and feet.  Prurutic.  Toenails thickened and deformed, long, itchy feet. Neurologic:  Grossly non-focal. Skin:  Intact without suspicious lesions or rashes Psych:  Cognition and judgment appear intact. Alert and cooperative with normal attention span and concentration. No apparent delusions, illusions, hallucinations   Impression & Recommendations:  Problem # 1:  OBESITY, NOS (ICD-278.00) Assessment Deteriorated I wonder if there is a cushingoid component to his chronic daily high potency clobetasol useage.  Will check random morning cortisol when he comes in for FLP, TSH.  May eventually need low dose dexamethasone suppression test.  He also does endorse dietary indiscretions and does not follow he list of acceptable foods given to him by Drs. Wonda and [employer].  He has a CM who supposedly shops for groceries with him.  Exercises as in HPI.  It is most likely that his cont weight gain is 2/2 overeating.  Will refer back to Dr. Wonda for a follow up visit as he missed his last one.  If still no improvement after some time will start orlistat and metformin.  Orders: [facility]- Est  Level 4 ([address])Future Orders: Lipid-[facility] ([id number]) ... 10/10/2009 TSH-[facility] [id number]) ... 10/10/2009 Miscellaneous Lab Charge-[facility] [id number]) ... 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  Orders: [facility]- Est  Level 4 ([address])Future Orders: TSH-[facility] ([id number]) ... 10/10/2009 Miscellaneous Lab Charge-[facility] [id number]) ...  10/10/2009  Problem # 3:  HYPERTENSION, BENIGN SYSTEMIC (ICD-401.1) Assessment: Improved BP controlled, will check ranal function at his FLP appt.  His updated medication list for this problem includes:    Metoprolol Tartrate 50 Mg Tabs (Metoprolol tartrate) .SABRA... Take one tablet two times a day  Orders: [facility]- Est  Level 4 ([address])Future Orders: Comp Met-[facility] ([id number]) ... 10/10/2009  Problem # 4:  PSORIASIS (ICD-696.1) Assessment: Unchanged Dx by derm. Using clobetasol topical cream.  Unchanged per pt.  If still unchanged and cortisol levels normal (see #1) then will change to ointment and then occlude after application.  Consider Topical Vitamin D.  Orders: [facility]- Est  Level 4 (00785)  Problem # 5:  SLEEP APNEA (ICD-780.57) Assessment: Improved Uses CPAP regularly.  Feels that it helps a lot.  Orders: [facility]- Est  Level 4 (00785)  Problem # 6:  DYSLIPIDEMIA (ICD-272.4) Assessment: Deteriorated Lipids uncontrolled at last check.  Will recheck FLP and start statin if not improved.  Goal : LDL<100, HDL>40.  Will add CMET in case statin needs to be started.  Also checking cortisol as in #1.  Orders: [facility]- Est  Level 4 ([address])Future Orders: Comp Met-[facility] ([id number]) ... 10/10/2009 Lipid-[facility] ([id number]) ... 10/10/2009 Miscellaneous Lab Charge-[facility] [id number]) ... 10/10/2009  Problem # 7:  DEPRESSION/ANXIETY (ICD-300.4) Assessment: Unchanged His psychotropics, particularly abilify  could be contributing to his  obesity.  Will follow for now.  Future Orders: TSH-[facility] ([id number]) ... 10/10/2009 Miscellaneous Lab Charge-[facility] [id number]) ... 10/10/2009  Problem # 8:  ONYCHOMYCOSIS, TOENAILS (ICD-110.1) Assessment: New Nails thickened, dystrophic.  Will check for fungal oganisms at next visit and start on lamisil if isolated.  Will also need to trim toenails.    Complete Medication List: 1)  Neurontin  300 Mg Caps (Gabapentin ) .... Take 2 capsule by mouth three times a day 2)  Prilosec 40  Mg Cpdr (Omeprazole ) .... Take one tablet by mouth daily 3)  Prozac  40 Mg Caps (Fluoxetine  hcl) .SABRA.. 1 capsule by mouth once a day 4)  Metoprolol Tartrate 50 Mg Tabs (Metoprolol tartrate) .... Take one tablet two times a day 5)  Trazodone Hcl 100 Mg Tabs (Trazodone hcl) .... Takes 2 tablets at bedtime - prescribed by linda greninger, psych 6)  Abilify  5 Mg Tabs (Aripiprazole ) .... Take one tablet by mouth daily 7)  Clobetasol Propionate 0.05 % Crea (Clobetasol propionate) .... Apply to psorisis on hands two times a day for 2 weeks. dispense 1 tube 8)  Robaxin -750 750 Mg Tabs (Methocarbamol ) .... Take one tablet every 8 hours for chest wall muscle pain. 9)  Corfen-dm 11-20-13 Mg/88ml Liqd (Phenylephrine-chlorphen-dm) .... Two teaspoonsful three times a day as needed for cough, 150 cc 10)  Proair  Hfa 108 (90 Base) Mcg/act Aers (Albuterol  sulfate) .... Inhale one puff every 4 hours as needed wheezing. 11)  2 Wrist Braces  .... Please provide left and right wrist brace for bilateral hand numbness that occurs every night. has hx of carpal tunnel. 12)  [facility]vs Loratadine  10 Mg Tabs (Loratadine ) .... Take one tablet daily as needed allergies  Patient Instructions: 1)  Great to meet you today, 2)  Your physical exam was normal. 3)  Go to the front and make a lab appt for fasting bloodwork. 4)  Make an appointment to see me again at my next slot to assess your toenails. 5)  I want you to see Dr. Wonda again regarding your weight gain, make an appt at the front to see her. 6)  -Dr. ONEIDA.   Prevention & Chronic Care Immunizations   Influenza vaccine: Not documented    Tetanus booster: 02/17/2001: Done.   Tetanus booster due: 02/18/2011    Pneumococcal vaccine: Not documented  Other Screening   Smoking status: never  (10/10/2008)  Lipids   Total Cholesterol: 189  (03/13/2008)   LDL: 135  (03/13/2008)   LDL Direct: Not documented   HDL: 30  (03/13/2008)   Triglycerides: 118  (03/13/2008)    SGOT  (AST): Not documented   SGPT (ALT): Not documented CMP ordered    Alkaline phosphatase: Not documented   Total bilirubin: Not documented  Hypertension   Last Blood Pressure: 133 / 85  (10/10/2008)   Serum creatinine: 0.9  (03/31/2007)   Serum potassium 3.9  (03/31/2007) CMP ordered   Self-Management Support :    Hypertension self-management support: Not documented    Lipid self-management support: Not documented

## 2010-03-19 NOTE — Progress Notes (Signed)
   Phone Note Call from Patient   Caller: Patient Summary of Call: Pt called because his "L eye is on fire".  He says that his eye lid and the area surrounding his eye are burning.  He says that the eye itself is not affected and is not red.  He has no vision changes.  He denies any exposure to chemicals, irritating foods like pepper, but he has been rubbing his eye a lot today.  He has a history of allergies.  He has tried a cool cloth with some relief.    I advised that he continue the cool cloths as needed and that he can try either saline or antihistamine eye drops.  I told him that if he has any pain in the eye itself or vision changes, he should call the [facility].  If he is still having trouble monday, he should call clinic for work in appt. Initial call taken by: Ellery Plunk MD,  June 02, 2009 4:11 PM

## 2010-03-19 NOTE — Progress Notes (Signed)
Summary: Ref Req   Phone Note Call from Patient Call back at [phone]   Caller: Patient Summary of Call: Pt has several teeth that are broken and hurting and needs referral to dental clinic. Initial call taken by: Clydell Hakim,  October 03, 2009 11:07 AM  Follow-up for Phone Call        message left on voicemail to call back to dicuss. Follow-up by: Theresia Lo RN,  October 03, 2009 12:30 PM  Additional Follow-up for Phone Call Additional follow up Details #1::        spoke with patient and advised that if his problem requires antibiotic and pain medication we will be able to send in  an urgent referral to [facility]. advised he needs appointment to access dental  problem. he is in agreement to come in for appointment and appointment is scheduled tomorrow at his request.  offered today but he cannot come today. Additional Follow-up by: Theresia Lo RN,  October 04, 2009 9:41 AM

## 2010-03-20 ENCOUNTER — Ambulatory Visit (INDEPENDENT_AMBULATORY_CARE_PROVIDER_SITE_OTHER): Payer: 59 | Admitting: Psychiatry

## 2010-03-20 DIAGNOSIS — F259 Schizoaffective disorder, unspecified: Secondary | ICD-10-CM

## 2010-03-21 NOTE — Assessment & Plan Note (Signed)
Summary: migraine/eo   Vital Signs:  Patient profile:   40 year old male Height:      72 inches Weight:      331.8 pounds BMI:     45.16 Temp:     98.4 degrees F oral Pulse rate:   81 / minute BP sitting:   139 / 85  (left arm) Cuff size:   large  Vitals Entered By: Garen Grams LPN (February 27, 2010 9:47 AM) CC: headache x 3 days Is Patient Diabetic? No Pain Assessment Patient in pain? yes     Location: headache   Primary Care Provider:  Ellery Plunk MD  CC:  headache x 3 days.  History of Present Illness: since sunday, woke up with HA, worse when he sits up, nausea and photophobia.  able to eat, not vomitting.  no vision changes, no aura with migraine.  has a similar HA 2-3 x/year, usually gets toradol injection.  Habits & Providers  Alcohol-Tobacco-Diet     Alcohol drinks/day: 0     Tobacco Status: never     Diet Comments: discussed need to eat more fruit and vegetables  Current Medications (verified): 1)  Neurontin 300 Mg Caps (Gabapentin) .... Take 1 Capsule By Mouth Three Times A Day 2)  Prilosec 40 Mg  Cpdr (Omeprazole) .... Take One Tablet By Mouth Daily 3)  Prozac 40 Mg Caps (Fluoxetine Hcl) .Marland Kitchen.. 1 Capsule By Mouth Once A Day 4)  Metoprolol Tartrate 50 Mg  Tabs (Metoprolol Tartrate) .... Take One Tablet Two Times A Day 5)  Trazodone Hcl 100 Mg  Tabs (Trazodone Hcl) .... Takes 2 Tablets At Bedtime - Prescribed By Brock Bad, Psych 6)  Abilify 5 Mg  Tabs (Aripiprazole) .... Take One Tablet By Mouth Daily 7)  Clobetasol Propionate 0.05 %  Crea (Clobetasol Propionate) .... 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 Dispense 1 Tube 8)  Proair Hfa 108 (90 Base) Mcg/act Aers (Albuterol Sulfate) .... Inhale One Puff Every 4 Hours As Needed Wheezing. 9)  2 Wrist Braces .... Please Provide Left and Right Wrist Brace For Bilateral Hand Numbness That Occurs Every Night. Has Hx of Carpal Tunnel. 10)  Promethazine Hcl 12.5 Mg Tabs (Promethazine Hcl) ....  Take One To Two At The Start of Headache To Help With Nausea  Allergies (verified): No Known Drug Allergies  Review of Systems  The patient denies weight loss, chest pain, and peripheral edema.    Physical Exam  General:  uncomfortable well-hydrated.   Lungs:  normal respiratory effort and normal breath sounds.   Heart:  normal rate and regular rhythm.   Abdomen:  soft and non-tender.   Neurologic:  alert & oriented X3, cranial nerves II-XII intact, strength normal in all extremities, sensation intact to light touch, gait normal, and finger-to-nose normal.     Impression & Recommendations:  Problem # 1:  MIGRAINE, UNSPEC., W/O INTRACTABLE MIGRAINE (ICD-346.90) Assessment Unchanged give toradol today.  phenergan and aleve to use to abort future ha.  pt returning in 1 week.  will continue discussion then The following medications were removed from the medication list:    Meloxicam 7.5 Mg Tabs (Meloxicam) .Marland Kitchen... Take one by mouth daily.  do not take advil with this medicine    Vicodin Es 7.5-750 Mg Tabs (Hydrocodone-acetaminophen) .Marland Kitchen... Take 1 every 4 hours as needed His updated medication list for this problem includes:    Metoprolol Tartrate 50 Mg Tabs (Metoprolol tartrate) .Marland Kitchen... Take one tablet two  times a day  Orders: Ketorolac-Toradol 15mg  (J1885) [facility]- Est Level  3 (98119)  Complete Medication List: 1)  Neurontin 300 Mg Caps (Gabapentin) .... Take 1 capsule by mouth three times a day 2)  Prilosec 40 Mg Cpdr (Omeprazole) .... Take one tablet by mouth daily 3)  Prozac 40 Mg Caps (Fluoxetine hcl) .Marland Kitchen.. 1 capsule by mouth once a day 4)  Metoprolol Tartrate 50 Mg Tabs (Metoprolol tartrate) .... Take one tablet two times a day 5)  Trazodone Hcl 100 Mg Tabs (Trazodone hcl) .... Takes 2 tablets at bedtime - prescribed by linda greninger, psych 6)  Abilify 5 Mg Tabs (Aripiprazole) .... Take one tablet by mouth daily 7)  Clobetasol Propionate 0.05 % Crea (Clobetasol propionate) ....  Apply to psorisis on hands two times a day for 2 weeks. dispense 1 tube 8)  Proair Hfa 108 (90 Base) Mcg/act Aers (Albuterol sulfate) .... Inhale one puff every 4 hours as needed wheezing. 9)  2 Wrist Braces  .... Please provide left and right wrist brace for bilateral hand numbness that occurs every night. has hx of carpal tunnel. 10)  Promethazine Hcl 12.5 Mg Tabs (Promethazine hcl) .... Take one to two at the start of headache to help with nausea  Patient Instructions: 1)  For the headache: 2)  I gave you toradol here. 3)  Go to the pharmacy and get aleve and the phenergan 4)  Take the phenergan (two tabs) when you get home.  This should help you sleep. 5)  Next time you get a migraine, take 2 aleve and a phenergan at the start of the headache.  This should help. Prescriptions: PROMETHAZINE HCL 12.5 MG TABS (PROMETHAZINE HCL) take one to two at the start of headache to help with nausea  #20 x 2   Entered and Authorized by:   Ellery Plunk MD   Signed by:   Ellery Plunk MD on 02/27/2010   Method used:   Electronically to        [facility] [phone]* (retail)       [address]       [place], [place]  [address]       Ph: [phone]       Fax: [phone]   RxID:   [id number]    Medication Administration  Injection # 1:    Medication: Ketorolac-Toradol 15mg     Diagnosis: MIGRAINE, UNSPEC., W/O INTRACTABLE MIGRAINE (ICD-346.90)    Route: IM    Site: L deltoid    Exp Date: 06/18/2011    Lot #: [id number]    Mfr: Novaplus    Comments: Patient recieved 30mg  of Toradol    Patient tolerated injection without complications    Given by: Garen Grams LPN (February 27, 2010 10:29 AM)  Orders Added: 1)  Ketorolac-Toradol 15mg  [J1885] 2)  [facility]- Est Level  3 [56387]     Medication Administration  Injection # 1:    Medication: Ketorolac-Toradol 15mg     Diagnosis: MIGRAINE, UNSPEC., W/O INTRACTABLE MIGRAINE (ICD-346.90)    Route: IM    Site: L deltoid    Exp Date:  06/18/2011    Lot #: [id number]    Mfr: Novaplus    Comments: Patient recieved 30mg  of Toradol    Patient tolerated injection without complications    Given by: Garen Grams LPN (February 27, 2010 10:29 AM)  Orders Added: 1)  Ketorolac-Toradol 15mg  [J1885] 2)  [facility]- Est Level  3 [51884]

## 2010-03-21 NOTE — Assessment & Plan Note (Signed)
Summary: F/U  KH   Vital Signs:  Patient profile:   40 year old male Height:      72 inches Weight:      329.8 pounds BMI:     44.89 Temp:     98.1 degrees F oral Pulse rate:   69 / minute BP sitting:   128 / 78  (left arm) Cuff size:   large  Vitals Entered By: Garen Grams LPN (February 04, 2010 10:54 AM) CC: discuss bp Is Patient Diabetic? No Pain Assessment Patient in pain? no        Primary Care Katisha Shimizu:  Ellery Plunk MD  CC:  discuss bp.  History of Present Illness: pt here because BP was elevated at dentist.  pt does not remember how high.  Pt has 2 teeth that need to be pulled this next week.  the dentist wanted him to be seen by his doctor to eval bp  Habits & Providers  Alcohol-Tobacco-Diet     Alcohol drinks/day: 0     Tobacco Status: never     Diet Comments: discussed need to eat more fruit and vegetables  Current Medications (verified): 1)  Neurontin 300 Mg Caps (Gabapentin) .... Take 1 Capsule By Mouth Three Times A Day 2)  Prilosec 40 Mg  Cpdr (Omeprazole) .... Take One Tablet By Mouth Daily 3)  Prozac 40 Mg Caps (Fluoxetine Hcl) .Marland Kitchen.. 1 Capsule By Mouth Once A Day 4)  Metoprolol Tartrate 50 Mg  Tabs (Metoprolol Tartrate) .... Take One Tablet Two Times A Day 5)  Trazodone Hcl 100 Mg  Tabs (Trazodone Hcl) .... Takes 2 Tablets At Bedtime - Prescribed By Brock Bad, Psych 6)  Abilify 5 Mg  Tabs (Aripiprazole) .... Take One Tablet By Mouth Daily 7)  Clobetasol Propionate 0.05 %  Crea (Clobetasol Propionate) .... 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 Dispense 1 Tube 8)  Proair Hfa 108 (90 Base) Mcg/act Aers (Albuterol Sulfate) .... Inhale One Puff Every 4 Hours As Needed Wheezing. 9)  2 Wrist Braces .... Please Provide Left and Right Wrist Brace For Bilateral Hand Numbness That Occurs Every Night. Has Hx of Carpal Tunnel. 10)  Zofran Odt 8 Mg Tbdp (Ondansetron) .... Place One Under Tongue and Allow To Dissolve Every 6-8 Hours As Needed  Nausea 11)  Meloxicam 7.5 Mg Tabs (Meloxicam) .... Take One By Mouth Daily.  Do Not Take Advil With This Medicine 12)  Vicodin Es 7.5-750 Mg Tabs (Hydrocodone-Acetaminophen) .... Take 1 Every 4 Hours As Needed  Allergies (verified): No Known Drug Allergies  Review of Systems  The patient denies anorexia, fever, weight loss, and chest pain.    Physical Exam  General:  NAD, comfortable Lungs:  Normal respiratory effort, chest expands symmetrically. Lungs are clear to auscultation, no crackles or wheezes. Heart:  Normal rate and regular rhythm. S1 and S2 normal without gallop, murmur, click, rub or other extra sounds. Abdomen:  Bowel sounds positive,abdomen soft and non-tender without masses, organomegaly or hernias noted.   Impression & Recommendations:  Problem # 1:  HYPERTENSION, BENIGN SYSTEMIC (ICD-401.1) Assessment Unchanged no change in meds today.  BP at goal. either BP elevated due to pain or cuff too small at dentist.   His updated medication list for this problem includes:    Metoprolol Tartrate 50 Mg Tabs (Metoprolol tartrate) .Marland Kitchen... Take one tablet two times a day  Orders: [facility]- Est Level  3 (54098)  Complete Medication List: 1)  Neurontin 300  Mg Caps (Gabapentin) .... Take 1 capsule by mouth three times a day 2)  Prilosec 40 Mg Cpdr (Omeprazole) .... Take one tablet by mouth daily 3)  Prozac 40 Mg Caps (Fluoxetine hcl) .Marland Kitchen.. 1 capsule by mouth once a day 4)  Metoprolol Tartrate 50 Mg Tabs (Metoprolol tartrate) .... Take one tablet two times a day 5)  Trazodone Hcl 100 Mg Tabs (Trazodone hcl) .... Takes 2 tablets at bedtime - prescribed by linda greninger, psych 6)  Abilify 5 Mg Tabs (Aripiprazole) .... Take one tablet by mouth daily 7)  Clobetasol Propionate 0.05 % Crea (Clobetasol propionate) .... Apply to psorisis on hands two times a day for 2 weeks. dispense 1 tube 8)  Proair Hfa 108 (90 Base) Mcg/act Aers (Albuterol sulfate) .... Inhale one puff every 4 hours as  needed wheezing. 9)  2 Wrist Braces  .... Please provide left and right wrist brace for bilateral hand numbness that occurs every night. has hx of carpal tunnel. 10)  Zofran Odt 8 Mg Tbdp (Ondansetron) .... Place one under tongue and allow to dissolve every 6-8 hours as needed nausea 11)  Meloxicam 7.5 Mg Tabs (Meloxicam) .... Take one by mouth daily.  do not take advil with this medicine 12)  Vicodin Es 7.5-750 Mg Tabs (Hydrocodone-acetaminophen) .... Take 1 every 4 hours as needed  Patient Instructions: 1)  come back in month to talk about blood pressure 2)  go to the pharmacy or [facility] and take blood pressure twice a week and write it down 3)  we will go from there   Orders Added: 1)  [facility]- Est Level  3 [56433]

## 2010-03-21 NOTE — Assessment & Plan Note (Signed)
Summary: F/U  KH   Vital Signs:  Patient profile:   40 year old male Height:      72 inches Weight:      335 pounds BMI:     45.60 BSA:     2.65 Temp:     97.9 degrees F Pulse rate:   80 / minute BP sitting:   133 / 85  Vitals Entered By: Jone Baseman CMA (March 06, 2010 10:37 AM) CC: f/u  migraine, HTN, obesity Is Patient Diabetic? No Pain Assessment Patient in pain? no        Primary Care Provider:  Ellery Plunk MD  CC:  f/u  migraine, HTN, and obesity.  History of Present Illness: migrane- better in 1 day.  did not have to use phenergan.  understands plan for next tiem he gets a migraine  HTN-  BP 150s in the dentist.  does get nervous there.  would like to increase his BP medication  obesity- has started drinking fruit shakes as meal replacement.  walking 30 q day.    Habits & Providers  Alcohol-Tobacco-Diet     Alcohol drinks/day: 0     Tobacco Status: never     Diet Comments: discussed need to eat more fruit and vegetables  Current Medications (verified): 1)  Neurontin 300 Mg Caps (Gabapentin) .... Take 1 Capsule By Mouth Three Times A Day 2)  Prilosec 40 Mg  Cpdr (Omeprazole) .... Take One Tablet By Mouth Daily 3)  Prozac 40 Mg Caps (Fluoxetine Hcl) .Marland Kitchen.. 1 Capsule By Mouth Once A Day 4)  Metoprolol Tartrate 50 Mg  Tabs (Metoprolol Tartrate) .... Take One and A Half Tablet Two Times A Day 5)  Trazodone Hcl 100 Mg  Tabs (Trazodone Hcl) .... Takes 2 Tablets At Bedtime - Prescribed By Brock Bad, Psych 6)  Abilify 5 Mg  Tabs (Aripiprazole) .... Take One Tablet By Mouth Daily 7)  Clobetasol Propionate 0.05 %  Crea (Clobetasol Propionate) .... 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 Dispense 1 Tube 8)  Proair Hfa 108 (90 Base) Mcg/act Aers (Albuterol Sulfate) .... Inhale One Puff Every 4 Hours As Needed Wheezing. 9)  2 Wrist Braces .... Please Provide Left and Right Wrist Brace For Bilateral Hand Numbness That Occurs Every Night. Has Hx  of Carpal Tunnel. 10)  Promethazine Hcl 12.5 Mg Tabs (Promethazine Hcl) .... Take One To Two At The Start of Headache To Help With Nausea  Allergies (verified): No Known Drug Allergies  Review of Systems  The patient denies fever, chest pain, and syncope.    Physical Exam  General:  Well-developed,well-nourished,in no acute distress; alert,appropriate and cooperative throughout examination Lungs:  Normal respiratory effort, chest expands symmetrically. Lungs are clear to auscultation, no crackles or wheezes. Heart:  Normal rate and regular rhythm. S1 and S2 normal without gallop, murmur, click, rub or other extra sounds. Abdomen:  Bowel sounds positive,abdomen soft and non-tender without masses, organomegaly or hernias noted.   Impression & Recommendations:  Problem # 1:  OBESITY, NOS (ICD-278.00) Assessment Unchanged trying to change diet and get exercise.  encourage behavior change.   Orders: [facility]- Est  Level 4 (54098)  Problem # 2:  HYPERTENSION, BENIGN SYSTEMIC (ICD-401.1) Assessment: Deteriorated will increase metop to 75.  RTC for nurse visit 1-2 weeks.  see back for CPE in march for blood work His updated medication list for this problem includes:    Metoprolol Tartrate 50 Mg Tabs (Metoprolol tartrate) .Marland Kitchen... Take  one and a half tablet two times a day  Orders: [facility]- Est  Level 4 (99214)  Problem # 3:  MIGRAINE, UNSPEC., W/O INTRACTABLE MIGRAINE (ICD-346.90) Assessment: Improved got better in 24 hours.  discussed plan for next migraine- take 2 aleve and phenergan at the begining of symptoms His updated medication list for this problem includes:    Metoprolol Tartrate 50 Mg Tabs (Metoprolol tartrate) .Marland Kitchen... Take one and a half tablet two times a day  Orders: [facility]- Est  Level 4 (16109)  Complete Medication List: 1)  Neurontin 300 Mg Caps (Gabapentin) .... Take 1 capsule by mouth three times a day 2)  Prilosec 40 Mg Cpdr (Omeprazole) .... Take one tablet by mouth daily 3)   Prozac 40 Mg Caps (Fluoxetine hcl) .Marland Kitchen.. 1 capsule by mouth once a day 4)  Metoprolol Tartrate 50 Mg Tabs (Metoprolol tartrate) .... Take one and a half tablet two times a day 5)  Trazodone Hcl 100 Mg Tabs (Trazodone hcl) .... Takes 2 tablets at bedtime - prescribed by linda greninger, psych 6)  Abilify 5 Mg Tabs (Aripiprazole) .... Take one tablet by mouth daily 7)  Clobetasol Propionate 0.05 % Crea (Clobetasol propionate) .... Apply to psorisis on hands two times a day for 2 weeks. dispense 1 tube 8)  Proair Hfa 108 (90 Base) Mcg/act Aers (Albuterol sulfate) .... Inhale one puff every 4 hours as needed wheezing. 9)  2 Wrist Braces  .... Please provide left and right wrist brace for bilateral hand numbness that occurs every night. has hx of carpal tunnel. 10)  Promethazine Hcl 12.5 Mg Tabs (Promethazine hcl) .... Take one to two at the start of headache to help with nausea  Patient Instructions: 1)  come back in 1-2 weeks for a blood pressure check with a nurse 2)  come back in march for a physical 3)  good luck at the dentist! 4)  increase your blood pressure to 75mg  two times a day 5)  You can take 1 and 1/2 pill until you run out Prescriptions: METOPROLOL TARTRATE 50 MG  TABS (METOPROLOL TARTRATE) Take one and a half tablet two times a day  #90 x 5   Entered and Authorized by:   Ellery Plunk MD   Signed by:   Ellery Plunk MD on 03/06/2010   Method used:   Electronically to        [facility] [phone]* (retail)       [address]       [place], [place]  [address]       Ph: [phone]       Fax: [phone]   RxID:   [id number]    Orders Added: 1)  [facility]- Est  Level 4 [02725]

## 2010-03-21 NOTE — Progress Notes (Signed)
Summary: EMERGENCY LINE CALL   Phone Note Call from Patient Call back at [phone]   Caller: Patient Summary of Call: 40 yo male with hx migraines with another migraine today.  Says he usually goes to the office or ED for a shot of toradol and that knocks it out.  He has no pain medicines at home or nausea medications.  Advised get naproxen otc, take 2 tabs two times a day, (440mg  two times a day) for the pain.  He says he wants to come to the ED for toradol, I advised use naproxen and tylenol, call office in AM for SDA.  Pt agreeable. Initial call taken by: Rodney Langton MD,  February 24, 2010 11:07 AM

## 2010-03-21 NOTE — Progress Notes (Signed)
Summary: BP reading   Phone Note Call from Patient Call back at Work Phone [phone]   Reason for Call: Talk to Nurse Summary of Call: pts bp was 150/93 yesterday Initial call taken by: Knox Royalty,  March 01, 2010 11:38 AM

## 2010-03-21 NOTE — Progress Notes (Signed)
   Phone Note Call from Patient   Caller: Patient Call For: [phone] Summary of Call: Lance Luna went to the dentist today to have an extraction of a tooth and they took his bp.  First reading was 158/98 and the second reading was 150/93.  He just wanted you to know.

## 2010-03-27 ENCOUNTER — Ambulatory Visit (INDEPENDENT_AMBULATORY_CARE_PROVIDER_SITE_OTHER): Payer: 59 | Admitting: Psychiatry

## 2010-03-27 DIAGNOSIS — F259 Schizoaffective disorder, unspecified: Secondary | ICD-10-CM

## 2010-04-03 ENCOUNTER — Ambulatory Visit (INDEPENDENT_AMBULATORY_CARE_PROVIDER_SITE_OTHER): Payer: 59 | Admitting: Psychiatry

## 2010-04-03 DIAGNOSIS — F209 Schizophrenia, unspecified: Secondary | ICD-10-CM

## 2010-04-04 ENCOUNTER — Emergency Department (HOSPITAL_COMMUNITY)
Admission: EM | Admit: 2010-04-04 | Discharge: 2010-04-04 | Disposition: A | Discharge status: Home / Self Care | Payer: 59 | Attending: Emergency Medicine | Admitting: Emergency Medicine

## 2010-04-04 ENCOUNTER — Ambulatory Visit: Payer: 59 | Admitting: Family Medicine

## 2010-04-04 ENCOUNTER — Encounter: Payer: Self-pay | Admitting: Family Medicine

## 2010-04-04 ENCOUNTER — Emergency Department (HOSPITAL_COMMUNITY): Payer: 59

## 2010-04-04 DIAGNOSIS — Z79899 Other long term (current) drug therapy: Secondary | ICD-10-CM | POA: Insufficient documentation

## 2010-04-04 DIAGNOSIS — G8929 Other chronic pain: Secondary | ICD-10-CM | POA: Insufficient documentation

## 2010-04-04 DIAGNOSIS — I10 Essential (primary) hypertension: Secondary | ICD-10-CM | POA: Insufficient documentation

## 2010-04-04 DIAGNOSIS — R0989 Other specified symptoms and signs involving the circulatory and respiratory systems: Secondary | ICD-10-CM | POA: Insufficient documentation

## 2010-04-04 DIAGNOSIS — K219 Gastro-esophageal reflux disease without esophagitis: Secondary | ICD-10-CM | POA: Insufficient documentation

## 2010-04-04 DIAGNOSIS — M549 Dorsalgia, unspecified: Secondary | ICD-10-CM | POA: Insufficient documentation

## 2010-04-04 DIAGNOSIS — F329 Major depressive disorder, single episode, unspecified: Secondary | ICD-10-CM | POA: Insufficient documentation

## 2010-04-04 DIAGNOSIS — R0609 Other forms of dyspnea: Secondary | ICD-10-CM | POA: Insufficient documentation

## 2010-04-04 DIAGNOSIS — R0602 Shortness of breath: Secondary | ICD-10-CM | POA: Insufficient documentation

## 2010-04-04 DIAGNOSIS — F3289 Other specified depressive episodes: Secondary | ICD-10-CM | POA: Insufficient documentation

## 2010-04-04 DIAGNOSIS — R079 Chest pain, unspecified: Secondary | ICD-10-CM | POA: Insufficient documentation

## 2010-04-04 LAB — COMPREHENSIVE METABOLIC PANEL
AST: 43 U/L — ABNORMAL HIGH (ref 0–37)
BUN: 7 mg/dL (ref 6–23)
CO2: 24 mEq/L (ref 19–32)
Calcium: 9.3 mg/dL (ref 8.4–10.5)
Chloride: 105 mEq/L (ref 96–112)
Creatinine, Ser: 0.81 mg/dL (ref 0.4–1.5)
GFR calc Af Amer: >60 mL/min (ref >60)
GFR calc non Af Amer: >60 mL/min (ref >60)
Total Bilirubin: 0.8 mg/dL (ref 0.3–1.2)

## 2010-04-04 LAB — POCT CARDIAC MARKERS
CKMB, poc: 1.2 ng/mL (ref 1.0–8.0)
Myoglobin, poc: 139 ng/mL (ref 12–200)
Myoglobin, poc: 152 ng/mL (ref 12–200)

## 2010-04-04 LAB — DIFFERENTIAL
Basophils Absolute: 0.1 10*3/uL (ref 0.0–0.1)
Lymphocytes Relative: 21 % (ref 12–46)
Lymphs Abs: 1.9 10*3/uL (ref 0.7–4.0)
Monocytes Absolute: 0.8 10*3/uL (ref 0.1–1.0)
Neutro Abs: 6.1 10*3/uL (ref 1.7–7.7)
Neutrophils Relative %: 66 % (ref 43–77)
Smear Review: ADEQUATE

## 2010-04-04 LAB — CBC
HCT: 44.6 % (ref 39.0–52.0)
Hemoglobin: 15.9 g/dL (ref 13.0–17.0)
Platelets: 240 10*3/uL (ref 150–400)
RDW: 14.2 % (ref 11.5–15.5)

## 2010-04-05 ENCOUNTER — Telehealth: Payer: Self-pay | Admitting: Family Medicine

## 2010-04-05 NOTE — Telephone Encounter (Signed)
Will forward to MD.  

## 2010-04-08 ENCOUNTER — Encounter: Payer: Self-pay | Admitting: Family Medicine

## 2010-04-08 ENCOUNTER — Telehealth: Payer: Self-pay | Admitting: *Deleted

## 2010-04-08 ENCOUNTER — Other Ambulatory Visit: Payer: Self-pay | Admitting: Family Medicine

## 2010-04-08 ENCOUNTER — Emergency Department (HOSPITAL_COMMUNITY)
Admission: EM | Admit: 2010-04-08 | Discharge: 2010-04-08 | Disposition: A | Discharge status: Home / Self Care | Payer: Medicare Other | Attending: Emergency Medicine | Admitting: Emergency Medicine

## 2010-04-08 ENCOUNTER — Emergency Department (HOSPITAL_COMMUNITY): Payer: Medicare Other

## 2010-04-08 DIAGNOSIS — I10 Essential (primary) hypertension: Secondary | ICD-10-CM | POA: Insufficient documentation

## 2010-04-08 DIAGNOSIS — R0609 Other forms of dyspnea: Secondary | ICD-10-CM | POA: Insufficient documentation

## 2010-04-08 DIAGNOSIS — Z79899 Other long term (current) drug therapy: Secondary | ICD-10-CM | POA: Insufficient documentation

## 2010-04-08 DIAGNOSIS — R0989 Other specified symptoms and signs involving the circulatory and respiratory systems: Secondary | ICD-10-CM | POA: Insufficient documentation

## 2010-04-08 DIAGNOSIS — F329 Major depressive disorder, single episode, unspecified: Secondary | ICD-10-CM | POA: Insufficient documentation

## 2010-04-08 DIAGNOSIS — F3289 Other specified depressive episodes: Secondary | ICD-10-CM | POA: Insufficient documentation

## 2010-04-08 DIAGNOSIS — M25519 Pain in unspecified shoulder: Secondary | ICD-10-CM | POA: Insufficient documentation

## 2010-04-08 DIAGNOSIS — R0789 Other chest pain: Secondary | ICD-10-CM

## 2010-04-08 DIAGNOSIS — K219 Gastro-esophageal reflux disease without esophagitis: Secondary | ICD-10-CM | POA: Insufficient documentation

## 2010-04-08 DIAGNOSIS — R071 Chest pain on breathing: Secondary | ICD-10-CM | POA: Insufficient documentation

## 2010-04-08 DIAGNOSIS — M542 Cervicalgia: Secondary | ICD-10-CM | POA: Insufficient documentation

## 2010-04-08 LAB — DIFFERENTIAL
Basophils Absolute: 0.1 10*3/uL (ref 0.0–0.1)
Basophils Relative: 1 % (ref 0–1)
Eosinophils Absolute: 0.3 10*3/uL (ref 0.0–0.7)
Eosinophils Relative: 3 % (ref 0–5)
Monocytes Absolute: 0.7 10*3/uL (ref 0.1–1.0)
Monocytes Relative: 6 % (ref 3–12)
Neutro Abs: 7.2 10*3/uL (ref 1.7–7.7)

## 2010-04-08 LAB — CBC
MCH: 31.7 pg (ref 26.0–34.0)
MCHC: 34.8 g/dL (ref 30.0–36.0)
Platelets: 248 10*3/uL (ref 150–400)
RDW: 14.4 % (ref 11.5–15.5)

## 2010-04-08 LAB — BASIC METABOLIC PANEL
CO2: 25 mEq/L (ref 19–32)
Calcium: 9.3 mg/dL (ref 8.4–10.5)
Creatinine, Ser: 0.89 mg/dL (ref 0.4–1.5)
GFR calc Af Amer: >60 mL/min (ref >60)
GFR calc non Af Amer: >60 mL/min (ref >60)
Sodium: 139 mEq/L (ref 135–145)

## 2010-04-08 LAB — POCT CARDIAC MARKERS
CKMB, poc: 2 ng/mL (ref 1.0–8.0)
Myoglobin, poc: 188 ng/mL (ref 12–200)
Troponin i, poc: <0.05 ng/mL (ref 0.00–0.09)

## 2010-04-08 LAB — RAPID URINE DRUG SCREEN, HOSP PERFORMED
Amphetamines: NOT DETECTED
Barbiturates: NOT DETECTED
Opiates: POSITIVE — AB

## 2010-04-08 LAB — D-DIMER, QUANTITATIVE: D-Dimer, Quant: 0.24 ug/mL-FEU (ref 0.00–0.48)

## 2010-04-08 NOTE — Telephone Encounter (Signed)
C/o pain on r side of chest & r arm pain. X 1 wk. Was given hydrocodiene & NSAID at ED last week. Pain is 10/10. C/o breathing problems. States the meds are not helping. Feels nauseous. Denies diaphoresis. Sent to ED now. He will call for a f/u appt after being seen. States they had told him at last ED visit that it was not his heart

## 2010-04-08 NOTE — H&P (Signed)
Family Medicine Teaching [facility] Admission History and Physical  Patient name: Lance Luna Medical record number: [id number] Date of birth: 1970/07/09 Age: 40 y.o. 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  The pain is described as burning, is not relieved by anything and is made worse by either movement or sitting up.  It is not alleviated by anything.  The patient says his pain came on at rest while watching TV and has been constant and increasing in intensity ever since.  It has been causing him to feel somewhat short of breath and this, too, has been progressive.  He came to the emergency department several days ago and had a negative cardiac rule out at the time.  He also has had a negative cardiac cath in 2009.  In the ED has has been given morphine, SL nitro, and a GI cocktail, all to no effect.  He also reports that a muscle relaxer and opiate, prescribed at his previous ED visit, have not helped.  Initial laboratory and imaging studies in the ED are non-remarkable.  Patient Active Problem List  Diagnoses  . ONYCHOMYCOSIS, TOENAILS  . DYSLIPIDEMIA  . OBESITY, NOS  . DEPRESSION/ANXIETY  . MENTAL RETARDATION  . MIGRAINE, UNSPEC., W/O INTRACTABLE MIGRAINE  . HORDEOLUM EXTERNUM  . HYPERTENSION, BENIGN SYSTEMIC  . Allergic Rhinitis, Cause Unspecified  . RESTRICTIVE LUNG DISEASE  . OTHER LOSS OF TEETH  . SCROTAL MASS  . PSORIASIS  . KNEE PAIN, ACUTE  . BACK PAIN, LUMBAR, CHRONIC  . PLANTAR FASCIITIS, RIGHT  . VERTIGO  . SLEEP APNEA  . NUMBNESS, HAND  . CHEST PAIN, ATYPICAL  . DYSURIA  . BROKEN TOOTH   Past Medical History: Past Medical History  Diagnosis Date  . H.  pylori infection 10/2001  . Carpal tunnel syndrome of left wrist   . Muscle strain     Multiple    Past Surgical History: Past Surgical History  Procedure Date  . Cervical disc surgery 2003  . Esophagogastroduodenoscopy 2003    No ulcers  . Appendectomy 2002  . Lumbar laminectomy 12/2002    L3-4, L4-5    Social History: History   Social History  . Marital Status: Single    Spouse Name: N/A    Number of Children: N/A  . Years of Education: N/A   Social History Main Topics  . Smoking status: Never Smoker   . Smokeless tobacco: Never Used  . Alcohol Use: No  . Drug Use: No  . Sexually Active: Not on file   Other Topics Concern  . Not on file   Social History Narrative   frequent visits to ED for various sx`s, Pain Management - Dr. Laneta Simmers - prescribes Neurontin.Dr. Cardell Peach at [facility] - she prescribes Prozac and Trazadone.Neurologist - Dr. Wynetta Emery on disability for ruptured disk in back; no etoh; non smoker; no drug use; one cup of coffee daily;; NCFunctional Assessment - Moderate; Lives in [place] (?) apartments per [employer] (org that helps people w/learning disabilities)Wears diaper because sometimes has accidents. Lives alone. Family lives in [place] and parents won't talk to him. Spends time with the Yetta Barre family - his  godparents - POA. [phone]. 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    Allergies: Allergies not on file  Current Outpatient Prescriptions  Medication Sig Dispense Refill  . albuterol (PROAIR HFA) 108 (90 BASE) MCG/ACT inhaler Inhale 1 puff into the lungs every 4 (four) hours as needed.        . ARIPiprazole (ABILIFY) 5 MG tablet Take 5 mg by mouth daily.        . clobetasol (TEMOVATE) 0.05 % cream Apply to psorisis on hands two times a day for 2 weeks. Dispense 1 tube was prescribed by dermatologist but patient has not gone back to see him       . Elastic  Bandages & Supports (ACE WRIST BRACE) MISC Please provide left and right wrist brace for bilateral hand numbness that occurs every night. Has hx of carpal tunnel.       Marland Kitchen FLUoxetine (PROZAC) 40 MG capsule Take 40 mg by mouth daily.        Marland Kitchen gabapentin (NEURONTIN) 300 MG capsule Take 300 mg by mouth 3 (three) times daily.        . metaxalone (SKELAXIN) 800 MG tablet Take 800 mg by mouth 3 (three) times daily.        . metoprolol (LOPRESSOR) 50 MG tablet Take 75 mg by mouth 2 (two) times daily.        Marland Kitchen omeprazole (PRILOSEC) 40 MG capsule Take 40 mg by mouth daily.        . promethazine (PHENERGAN) 12.5 MG tablet take one to two at the start of headache to help with nausea       . traZODone (DESYREL) 100 MG tablet Take 200 mg by mouth at bedtime. prescribed by Brock Bad, psych        Review Of Systems: Per HPI with the following additions: Chronic issues with vertigo but no recent visual changes, no reflux, no n/v/d, no fevers/chills, no hematuria, chronic issues with psoriasis but no acute skin changes. Otherwise 12 point review of systems was performed and was unremarkable.  Physical Exam: Pulse: 93 BP: 114/72 RR: 22 95% on room air, 98% on 2L [place], Temp 97.8  General: NAD, interactive and appropriate HEENT: PERRL, EOMI, pharynx non-erythematous, MMM Heart: regular rate, no murmurs Lungs: Normal respiratory effort. Lungs CTABL, no crackles or wheezes.  Lung sounds are distant. Chest: Tenderness to palpation over the left chest, left neck, and left arm.  Palpation of the left chest and left costo-condral joints elicits pain. Abdomen: SNTND, no guarding  Extremities: negative edema, ROM grossly preserved Skin: No sores or suspicious lesions or rashes or color changes Neurology: Alert and oriented, CN II-XII grossly intact  Labs and Imaging:  Sodium (NA)                              139               135-145          mEq/L  Potassium (K)                            3.9                3.5-5.1          mEq/L  Chloride  105               96-112           mEq/L  CO2                                      25                19-32            mEq/L  Glucose                                  88                70-99            mg/dL  BUN                                      6                 6-23             mg/dL  Creatinine                               0.89              0.4-1.5          mg/dL  Calcium                                  9.3               8.4-10.5         Mg/dL   D-Dimer, Fibrin Derivatives              0.24              0.00-0.48        ug/mL-FEU   CKMB, POC                                2.0               1.0-8.0          ng/mL  Troponin I, POC                          <0.05             0.00-0.09        ng/mL  Myoglobin, POC                           188               12-200           Ng/mL  UDS Positive for opiates   WBC  10.4              4.0-10.5         K/uL  RBC                                      5.02              4.22-5.81        MIL/uL  Hemoglobin (HGB)                         15.9              13.0-17.0        g/dL  Hematocrit (HCT)                         45.7              39.0-52.0        %  MCV                                      91.0              78.0-100.0       fL  MCH -                                    31.7              26.0-34.0        pg  MCHC                                     34.8              30.0-36.0        g/dL  RDW                                      14.4              11.5-15.5        %  Platelet Count (PLT)                     248               150-400          K/uL  Neutrophils, %                           69                43-77            %  Lymphocytes, %                           20                12-46            %  Monocytes, %  6                 3-12             %  Eosinophils, %                           3                 0-5               %  Basophils, %                             1                 0-1              %  Neutrophils, Absolute                    7.2               1.7-7.7          K/uL  Lymphocytes, Absolute                    2.1               0.7-4.0          K/uL  Monocytes, Absolute                      0.7               0.1-1.0          K/uL  Eosinophils, Absolute                    0.3               0.0-0.7          K/uL  Basophils, Absolute                      0.1               0.0-0.1          K/uL  CHEST - 2 VIEW    Comparison: 04/04/2010    Findings: Heart size upper normal.  No vascular congestion.  Mild   central airway thickening without active airspace disease or   pleural fluid.  Osseous structures intact.  Prior cervical fusion.    IMPRESSION:   Mild bronchitic changes - no active disease.  Cardiac Cath in 2009 demonstrated normal coronaries and normal left ventricular function   Assessment and Plan: 40 year old male with hypertension and one week history of constant, non-exertional, left-sided chest pain who has presented to the ED for the second time this week. 1. Chest Pain: Pt does not have a strong cardiac history, no family history of cardiac disease, had a negative chest pain rule-out earlier this week, and has no current EKG changes, and negative POC troponins.  Pt also has a negative cardiac cath in 2009.  Do not feel that the patient's pain is cardiac in etiology, due to negative workup, lack of risk factors, and reproducibility on exam.  Will discharge with an appointment tomorrow (04/09/2010) at 1:30pm with Dr Sonia Baller and the [facility] for OP workup and management  of his non-cardiac chest pain.  Will also give prescription for percocet to help with pain until tomorrow.

## 2010-04-08 NOTE — Telephone Encounter (Signed)
Called pt at home.  pt will call to make appt with first available provider. Lance Luna

## 2010-04-09 ENCOUNTER — Telehealth: Payer: Self-pay | Admitting: Family Medicine

## 2010-04-09 ENCOUNTER — Encounter: Payer: Self-pay | Admitting: Sports Medicine

## 2010-04-09 ENCOUNTER — Telehealth: Payer: Self-pay | Admitting: Sports Medicine

## 2010-04-09 ENCOUNTER — Ambulatory Visit (INDEPENDENT_AMBULATORY_CARE_PROVIDER_SITE_OTHER): Payer: Medicare Other | Admitting: Sports Medicine

## 2010-04-09 VITALS — BP 144/88 | HR 91 | Temp 98.6°F | Wt 335.4 lb

## 2010-04-09 DIAGNOSIS — L408 Other psoriasis: Secondary | ICD-10-CM

## 2010-04-09 DIAGNOSIS — R0789 Other chest pain: Secondary | ICD-10-CM

## 2010-04-09 LAB — URINE CULTURE

## 2010-04-09 MED ORDER — PREDNISONE (PAK) 10 MG PO TABS: ORAL_TABLET | ORAL | Status: DC

## 2010-04-09 MED ORDER — AMITRIPTYLINE HCL 50 MG PO TABS: ORAL_TABLET | ORAL | Status: DC

## 2010-04-09 MED ORDER — GABAPENTIN 600 MG PO TABS: 1200.0000 mg | ORAL_TABLET | Freq: Three times a day (TID) | ORAL | Status: DC

## 2010-04-09 MED ORDER — CALCIPOTRIENE 0.005 % EX OINT: TOPICAL_OINTMENT | Freq: Two times a day (BID) | CUTANEOUS | Status: DC

## 2010-04-09 NOTE — Patient Instructions (Addendum)
Great to see you, Taper down the fluoxetine as directed. Start taking amitriptyline as directed. Increase gabapentin to two 600mg  tabs 3x a day (I have called in the new 600mg  tabs) Do neck rehab exercises as directed. Start the new ointment for your psoriasis. Come back to see me in 2-3 weeks to reassess.  -Dr. Karie Schwalbe.

## 2010-04-09 NOTE — Telephone Encounter (Signed)
Spoke to pt's mother re: diagnoses, plan of care.

## 2010-04-09 NOTE — Assessment & Plan Note (Signed)
No improvement with clobetasol topical. Will increase to calcipotriene topical. Next choice would be topical coal tar if fails calcipotriene after 3 months. Also consider topical calcineurin inhibitors and methotrexate.

## 2010-04-09 NOTE — Progress Notes (Signed)
  Subjective:    Patient ID: Lance Luna, male    DOB: 16-Jan-1971, 40 y.o.   MRN: [id number]  HPI 40 yo male here with CP.  Hx chronic non-cardiac CP, catch neg/clean in 2009.  Multiple r/o's in hospital.  Seen in ED yesterday for CP and sent home.  POC CE neg, D-dimer neg.  CXR neg.  Pain has been present for 1 week.  L neck, L upper chest, L arm (entire arm, no particular dermatome).  No palliating or precipitating factors except movement and deep breathing.  No SOB, cough, pain with exertion, no nausea/diaphoresis, no V/D/C.  No abd pain.  No recent long trips.  No URI symptoms.    Review of Systems    Neg except as in HPI Objective:   Physical Exam  Constitutional: He appears well-developed and well-nourished. No distress.  Cardiovascular: Normal rate, regular rhythm and intact distal pulses.  Exam reveals no gallop and no friction rub.   No murmur heard. Pulmonary/Chest: Effort normal and breath sounds normal. No respiratory distress. He has no wheezes. He has no rales. He exhibits no tenderness.  Musculoskeletal: He exhibits no edema and no tenderness.  Skin: He is not diaphoretic.       Psoriatic plaques present on legs, hands.      Neck: Inspection unremarkable. No palpable stepoffs. POSITIVE Spurling's maneuver with reproduction of symptoms on L. AROM preserved however he resists me with PROM so difficult to asses. Grip strength and sensation normal in bilateral hands Strength good C4 to T1 distribution No gross sensory change to C4 to T1 Reflexes normal      Assessment & Plan:

## 2010-04-09 NOTE — Telephone Encounter (Signed)
No need for referral just yet.   I only refer if I have exhausted all treatment approaches and need either a procedure I cannot do or assistance in mgt. I am doing everything they would be doing right now so no referral yet.  Have him make sure he follows up re: the neck pain in 2-3 weeks. -Dr. Karie Schwalbe.

## 2010-04-09 NOTE — Assessment & Plan Note (Addendum)
Unlikely cardiac with clean cath 2009 and multiple recent neg r/o's.   Unlikely thromboembolic with neg d-dimer yesterday. I suspect that his CP radiating into L upper chest is in the distribution of the cervical plexus/supraclavicular nerves. He has a strong hx cervical DDD. On neurontin 1800 total daily.   Will increase this to 1200mg  TID (max dose) Prednisone 12d taper. Adding amitriptyline for its efficacy in chronic radicular pain (tapering prozac off as this has not been helpful, discussed these med changes with pharmacy resident) Pt may cont skelaxin until runs out then stop. RTC prn Neck rehab exercises given.

## 2010-04-09 NOTE — Telephone Encounter (Signed)
Referral to [facility] Has been seen there before and wants to be seen for pain in neck

## 2010-04-10 ENCOUNTER — Ambulatory Visit (INDEPENDENT_AMBULATORY_CARE_PROVIDER_SITE_OTHER): Payer: 59 | Admitting: Psychiatry

## 2010-04-10 DIAGNOSIS — F209 Schizophrenia, unspecified: Secondary | ICD-10-CM

## 2010-04-11 NOTE — Telephone Encounter (Signed)
This encounter was created in error - please disregard.

## 2010-04-12 ENCOUNTER — Telehealth: Payer: Self-pay | Admitting: Family Medicine

## 2010-04-12 NOTE — Telephone Encounter (Signed)
Discussed treatment with pt, he is asking for a nerve study by Dr. Joaquim Nam with physiatry.  I advised that even if the nerve study was positive, treatment would be the medications he is currently on as well as rehab.  There is no cure for his symptoms, we can manage his pain.  He became irate demanding the referral.  I advised that I would be happy to talk to Dr. Murray Hodgkins next week, call ended.

## 2010-04-12 NOTE — Telephone Encounter (Signed)
Is wanting to talk to Dr T about the nerve pain in neck - pls call

## 2010-04-15 ENCOUNTER — Telehealth: Payer: Self-pay | Admitting: Sports Medicine

## 2010-04-15 NOTE — Telephone Encounter (Signed)
Called Dr. Murray Hodgkins, discussed pt's cervical radicular symptoms and multiple admits for upper L CP suggestive of cervical plexus radiculopathy.  Pt had been asking for nerve study.  Dr. Murray Hodgkins will see pt and possibly do NCS.  Will forward my last note to [place].

## 2010-04-15 NOTE — Consult Note (Signed)
NAMEMADYX, DELFIN NO.:  [id number]  MEDICAL RECORD NO.:  [id number]           PATIENT TYPE:  E  LOCATION:  [facility]                         FACILITY:  [facility]  PHYSICIAN:  Santiago Bumpers. Linell Meldrum, M.D.DATE OF BIRTH:  August 16, 1970  DATE OF CONSULTATION:  04/08/2010 DATE OF DISCHARGE:  04/08/2010                                CONSULTATION   PRIMARY CARE PROVIDER:  Ellery Plunk, MD, at [facility].  CHIEF COMPLAINT:  Chest pain and shortness of breath.  HISTORY OF PRESENT ILLNESS:  The patient is a 40 year old male with mild mental retardation and a variety of chronic pain issues who presents today with a 1-week history of progressive left-sided chest pain that radiates to his neck and left arm.  The patient described is as burning, is not relieved by anything and is made worse by either movement or sitting up.  It is not alleviated anything.  The patient says that his pain came on while at rest and watching TV and has been constant andincreasing in intensity ever since.  It has been causing him to feel somewhat short of breath and this, too, has been progressive.  He came to the emergency department several days ago and had a negative cardiac, rule out at that time.  He also has a history of a negative cardiac cath in 2009 that was done for similar pain.  In the emergency department, the patient has been given morphine, sublingual nitro, and a GI cocktail, although no effects.  He also reports that a muscle relaxer and opiate prescribed at his previous ED visits have not been helpful. Initial laboratory and imaging studies in the ED are not remarkable.  PAST MEDICAL HISTORY: 1. Dyslipidemia. 2. Obesity. 3. Depression and anxiety. 4. Mental retardation. 5. Migraines. 6. Hypertension. 7. Allergic rhinitis. 8. Restrictive lung disease. 9. Knee pain. 10.Back pain. 11.Plantar fasciitis on the right. 12.Chronic vertigo. 13.Sleep apnea. 14.Hand  numbness status post carpal tunnel surgery. 15.Dysuria. 16.Multiple muscle strains. 17.H. pylori infection in 2003.  PAST SURGICAL HISTORY:  Cervical disk surgery performed in 2003, EGD performed in 2003 demonstrating no ulcers,, appendectomy performed in 2002, lumbar laminectomy performed in November 2004 of L3-4 and L4-5.  SOCIAL HISTORY:  Single.  Nonsmoker.  Does not use alcohol or other drugs.  The patient has a history of frequent ED visits for a variety of symptoms.  The patient is seen by Dr. Laneta Simmers for pain management.  This doctor prescribes Neurontin.  Also, seen by Dr. Hedy Jacob at mental health at the [facility] who prescribes Prozac and trazodone.  Also sees Dr. Wynetta Emery in Neurosurgery who performed his back surgery.  ALLERGIES:  No known drug allergies.  MEDICATIONS: 1. Albuterol 90 mCi inhaled every 4 hours as needed for shortness of     breath. 2. Abilify 5 mg by mouth daily. 3. Clobetasol 0.05% cream applied to psoriasis on hands as needed. 4. Prozac 40 mg by mouth daily. 5. Gabapentin 300 mg by mouth three times daily. 6. Metaxalone 800 mg by mouth three times daily as needed for pain. 7. Lopressor 75 mg by mouth  two times daily. 8. Omeprazole 40 mg by mouth daily. 9. Phenergan 12.5 mg by mouth at the start of a headache to help with     nausea. 10.Trazodone 200 mg by mouth at bedtime.  REVIEW OF SYSTEMS:  Per HPI with the following additions:  Chronic issues with vertigo but no recent visual changes, no reflux, no nausea, vomiting, diarrhea, no fevers, chills, no hematuria, chronic issue of psoriasis but no acute skin changes.  Otherwise, 12-point review of systems was performed and was unremarkable.  PHYSICAL EXAMINATION:  VITAL SIGNS:  Pulse 93, blood pressure 114/72, respirations 20 and 95% on room air, 98% on 2 L, temperature 97.8. GENERAL:  No acute distress, interactive and appropriate. HEENT:  Pupils equal, round, reactive to light.  Extraocular  movements intact.  Pharynx, nonerythematous.  Mucous membranes moist. HEART:  Regular rate and rhythm.  No murmurs. LUNGS:  Normal respiratory effort.  Clear to auscultation bilaterally. No crackles.  No wheezes.  Lung sounds are distant. CHEST:  Tenderness to palpation over the left chest, left neck, and left arm.  Palpation in the left chest and left costochondral joints elicits the patient's pain.  No adenopathy or deformities noted. ABDOMEN:  Soft, nontender, nondistended.  No guarding.  Obese. EXTREMITIES:  No edema.  Range of motion is grossly preserved. SKIN:  No sources or suspicious lesions. NEUROLOGY:  Alert and oriented.  Cranial nerves II through XII grossly intact.  LABORATORY DATA AND IMAGING:  Sodium 139, potassium 3.9, chloride 105, bicarb 25, glucose 88, BUN 6, creatinine 0.89, calcium 9.3.  D-dimer 0.24.  Point-of-care troponins less than 0.05.  UDS is positive for opiates.  White count of 10.4, hemoglobin 15.9, platelets of 248,000. Chest x-ray demonstrating mild bronchitic changes but no active disease.  ASSESSMENT AND PLAN:  This is a 40 year old male with hypertension and 1- week history of constant, nonexertional, left-sided chest pain whopresented to the ED for the second time this week. 1. Chest pain:  The patient does not have a strong cardiac history, no     family history of cardiac disease, had negative chest pain rule out     earlier this week, and has no current EKG changes and negative     point-of-care troponins.  The patient had a negative cardiac cath     in 2009.  I do not feel that the patient's pain is cardiac in     etiology due to the negative workup, lack of risk factors,     reproducibility on exam.  We will plan to discharge     the patient from the emergency department with an appointment     tomorrow on April 09, 2010, at 1:30 p.m. with Dr. Benjamin Stain     at the [facility] for outpatient workup and     management  of this noncardiac chest pain.  We will also give a     prescription for Percocet to help with his pain until tomorrow.    ______________________________ Majel Homer, MD   ______________________________ Santiago Bumpers. Leveda Anna, M.D.    ER/MEDQ  D:  04/08/2010  T:  04/09/2010  Job:  [id number]  Electronically Signed by Manuela Neptune MD on 04/10/2010 11:04:08 AM Electronically Signed by Doralee Albino M.D. on 04/15/2010 04:23:25 PM

## 2010-04-16 ENCOUNTER — Ambulatory Visit (INDEPENDENT_AMBULATORY_CARE_PROVIDER_SITE_OTHER): Payer: 59 | Admitting: Psychiatry

## 2010-04-16 DIAGNOSIS — F209 Schizophrenia, unspecified: Secondary | ICD-10-CM

## 2010-04-17 ENCOUNTER — Ambulatory Visit: Payer: 59 | Admitting: Psychiatry

## 2010-04-18 ENCOUNTER — Telehealth: Payer: Self-pay | Admitting: Family Medicine

## 2010-04-18 NOTE — Telephone Encounter (Signed)
Mr, Lance Luna would like you to call him.  He has an appt on 3/9 with you, but want you to call him first.

## 2010-04-18 NOTE — Telephone Encounter (Signed)
He's prob curious about the call to [facility] MD Dr. Murray Hodgkins.  I called him and Murray Hodgkins will set up an appt for nerve conduction tests.

## 2010-04-19 ENCOUNTER — Telehealth: Payer: Self-pay | Admitting: Family Medicine

## 2010-04-19 NOTE — Telephone Encounter (Signed)
i have reviewed epic chart.  No new x rays to send

## 2010-04-19 NOTE — Telephone Encounter (Signed)
Patient informed that there is no xray's in chart.  He has appt Friday and is requesting to be sent for an xray that day.  Will forward to MD as Berenice Primas, Maryjo Rochester

## 2010-04-19 NOTE — Telephone Encounter (Signed)
Pt calling back, his chiropractor needs to know if pt has had an xray on his neck?

## 2010-04-21 ENCOUNTER — Telehealth: Payer: Self-pay | Admitting: Family Medicine

## 2010-04-21 NOTE — Telephone Encounter (Signed)
Pt called and states that since yesterday he has had chest pain and that he is also have n/v when the pain is intense.  I instructed pt to go to the ER.  Pt states that he has in the past but that he is told that it is a "nerve problem".  Pt states that the n/v has been x 1 day.  Since pt has new symptoms of n/v and worsening cp I still recommend work up in ER.  Told pt that if he doesn't go to ER and Monday morning he still doesn't feel better to call at 8:30 to see if work -in appt available.

## 2010-04-22 ENCOUNTER — Ambulatory Visit (INDEPENDENT_AMBULATORY_CARE_PROVIDER_SITE_OTHER): Payer: 59 | Admitting: Family Medicine

## 2010-04-22 ENCOUNTER — Encounter: Payer: Self-pay | Admitting: Family Medicine

## 2010-04-22 ENCOUNTER — Ambulatory Visit (HOSPITAL_COMMUNITY)
Admission: RE | Admit: 2010-04-22 | Discharge: 2010-04-22 | Disposition: A | Discharge status: Home / Self Care | Payer: Medicare Other | Source: Ambulatory Visit | Attending: Sports Medicine | Admitting: Sports Medicine

## 2010-04-22 ENCOUNTER — Other Ambulatory Visit: Payer: Self-pay

## 2010-04-22 DIAGNOSIS — R079 Chest pain, unspecified: Secondary | ICD-10-CM | POA: Insufficient documentation

## 2010-04-22 DIAGNOSIS — R0789 Other chest pain: Secondary | ICD-10-CM

## 2010-04-22 MED ORDER — ASPIRIN EC 81 MG PO TBEC: 81.0000 mg | DELAYED_RELEASE_TABLET | Freq: Every day | ORAL | Status: DC

## 2010-04-22 MED ORDER — ASPIRIN 81 MG PO CHEW: 81.0000 mg | CHEWABLE_TABLET | Freq: Every day | ORAL | Status: DC

## 2010-04-22 MED ORDER — CYCLOBENZAPRINE HCL 5 MG PO TABS: 5.0000 mg | ORAL_TABLET | Freq: Three times a day (TID) | ORAL | Status: DC | PRN

## 2010-04-22 NOTE — Patient Instructions (Signed)
Chest Pain (Nonspecific) It is often hard to give a specific diagnosis for the cause of chest pain. There is always a chance that your pain could be related to something serious, such as a heart attack or a blood clot in the lungs. You need to follow up with your caregiver for further evaluation. CAUSES  Heartburn.   Pneumonia or bronchitis.   Anxiety and stress.   Inflammation around your heart (pericarditis) or lung (pleuritis or pleurisy).   A blood clot in the lung.   A collapsed lung (pneumothorax). It can develop suddenly on its own (spontaneous pneumothorax) or from injury (trauma) to the chest.  The chest wall is composed of bones, muscles, and cartilage. Any of these can be the source of the pain.  The bones can be bruised by injury.   The muscles or cartilage can be strained by coughing or overwork.   The cartilage can be affected by inflammation and become sore (costochondritis).  DIAGNOSIS Lab tests or other studies, such as X-rays, an EKG, stress testing, or cardiac imaging, may be needed to find the cause of your pain.  TREATMENT  Treatment depends on what may be causing your chest pain. Treatment may include:   Acid blockers for heartburn.  Anti-inflammatory medicine.   Pain medicine for inflammatory conditions.  Antibiotics if an infection is present.    You may be advised to change lifestyle habits. This includes stopping smoking and avoiding caffeine and chocolate.   You may be advised to keep your head raised (elevated) when sleeping. This reduces the chance of acid going backward from your stomach into your esophagus.   Most of the time, nonspecific chest pain will improve within 2 to 3 days with rest and mild pain medicine.  HOME CARE INSTRUCTIONS  If antibiotics were prescribed, take the full amount even if you start to feel better.   For the next few days, avoid physical activities that bring on chest pain. Continue physical activities as directed.     Do not smoke cigarettes or drink alcohol until your symptoms are gone.   Only take over-the-counter or prescription medicine for pain, discomfort, or fever as directed by your caregiver.   Follow your caregiver's suggestions for further testing if your chest pain does not go away.   Keep any follow-up appointments you made. If you do not go to an appointment, you could develop lasting (chronic) problems with pain. If there is any problem keeping an appointment, you must call to reschedule.  SEEK MEDICAL CARE IF:  You think you are having problems from the medicine you are taking. Read your medicine instructions carefully.   Your chest pain does not go away, even after treatment.   You develop a rash with blisters on your chest.  SEEK IMMEDIATE MEDICAL CARE IF:  You have increased chest pain or pain that spreads to your arm, neck, jaw, back, or belly (abdomen).   You develop shortness of breath, an increasing cough, or you are coughing up blood.   You have severe back or abdominal pain, feel sick to your stomach (nauseous) or throw up (vomit).   You develop severe weakness, fainting, or chills.   You have an oral temperature above 100.5, not controlled by medicine.  THIS IS AN EMERGENCY. Do not wait to see if the pain will go away. Get medical help at once. Call your local emergency services   (911 in [place] Do not drive yourself to the hospital. MAKE SURE YOU:  Understand   these instructions.   Will watch your condition.   Will get help right away if you are not doing well or get worse.  Document Released: 11/13/2004 Document Re-Released: 04/30/2009 Exit[employer] Patient Information 2011 [employer]. 

## 2010-04-22 NOTE — Progress Notes (Signed)
  Subjective:    Lance Luna is a 40 y.o. male who presents for evaluation of chest pain. Onset was 2 days ago. Symptoms have worsened since that time. The patient describes the pain as unable to truly delineate type of chest pain and radiates to the left arm. Patient rates pain as a 10/10 in intensity. Associated symptoms are: chest pain and dyspnea. Aggravating factors are: exercise. Alleviating factors are: rest. Patient's cardiac risk factors are: hypertension, male gender, obesity (BMI >= 30 kg/m2) and sedentary lifestyle. Patient's risk factors for DVT/PE: obesity. Previous cardiac testing: cath 2011 that was normal and stress testing recently that was normal as well. .  The following portions of the patient's history were reviewed and updated as appropriate: allergies, current medications, past family history, past medical history, past social history, past surgical history and problem list.  Review of Systems Pertinent items are noted in HPI.    Objective:    BP 132/89  Pulse 97  Temp(Src) 98.4 F (36.9 C) (Oral)  Wt 340 lb 3.2 oz (154.314 kg) General appearance: alert, cooperative, appears older than stated age and morbidly obese Head: Normocephalic, without obvious abnormality, atraumatic Throat: lips, mucosa, and tongue normal; teeth and gums normal Lungs: clear to auscultation bilaterally Chest wall: no tenderness, left sided chest wall tenderness Heart: regular rate and rhythm, S1, S2 normal, no murmur, click, rub or gallop Abdomen: soft, non-tender; bowel sounds normal; no masses,  no organomegaly Extremities: extremities normal, atraumatic, no cyanosis or edema  Cardiographics ECG: normal sinus rhythm, no blocks or conduction defects, no ischemic changes  Imaging Chest x-ray: not indicated    Assessment:    Chest pain, suspected etiology: chest wall pain    Plan:    Patient history and exam consistent with non-cardiac cause of chest pain. Conservative measures  indicated. Worsening signs and symptoms discussed and patient verbalized understanding. will check fasting lipid panel.  will start asa 81 mg daily as well as flexeril for muscle pain

## 2010-04-24 ENCOUNTER — Ambulatory Visit (INDEPENDENT_AMBULATORY_CARE_PROVIDER_SITE_OTHER): Payer: 59 | Admitting: Psychiatry

## 2010-04-24 DIAGNOSIS — F209 Schizophrenia, unspecified: Secondary | ICD-10-CM

## 2010-04-26 ENCOUNTER — Ambulatory Visit (INDEPENDENT_AMBULATORY_CARE_PROVIDER_SITE_OTHER): Payer: 59 | Admitting: Sports Medicine

## 2010-04-26 ENCOUNTER — Encounter: Payer: Self-pay | Admitting: Sports Medicine

## 2010-04-26 VITALS — BP 152/97 | HR 71 | Temp 97.5°F | Ht 71.75 in | Wt 335.0 lb

## 2010-04-26 DIAGNOSIS — F341 Dysthymic disorder: Secondary | ICD-10-CM

## 2010-04-26 DIAGNOSIS — E785 Hyperlipidemia, unspecified: Secondary | ICD-10-CM

## 2010-04-26 DIAGNOSIS — L408 Other psoriasis: Secondary | ICD-10-CM

## 2010-04-26 DIAGNOSIS — R0789 Other chest pain: Secondary | ICD-10-CM

## 2010-04-26 DIAGNOSIS — I10 Essential (primary) hypertension: Secondary | ICD-10-CM

## 2010-04-26 LAB — CONVERTED CEMR LAB
Albumin: 4.7 g/dL (ref 3.5–5.2)
BUN: 9 mg/dL (ref 6–23)
Calcium: 9.9 mg/dL (ref 8.4–10.5)
Chloride: 102 meq/L (ref 96–112)
Cholesterol: 175 mg/dL (ref 0–200)
Glucose, Bld: 90 mg/dL (ref 70–99)
HDL: 32 mg/dL — ABNORMAL LOW (ref >39)
Potassium: 4.6 meq/L (ref 3.5–5.3)
Triglycerides: 216 mg/dL — ABNORMAL HIGH (ref <150)

## 2010-04-26 LAB — COMPREHENSIVE METABOLIC PANEL
AST: 35 U/L (ref 0–37)
Alkaline Phosphatase: 76 U/L (ref 39–117)
BUN: 9 mg/dL (ref 6–23)
Glucose, Bld: 90 mg/dL (ref 70–99)
Sodium: 139 mEq/L (ref 135–145)
Total Bilirubin: 0.7 mg/dL (ref 0.3–1.2)
Total Protein: 7.8 g/dL (ref 6.0–8.3)

## 2010-04-26 LAB — LIPID PANEL
HDL: 32 mg/dL — ABNORMAL LOW (ref >39)
Total CHOL/HDL Ratio: 5.5 Ratio
VLDL: 43 mg/dL — ABNORMAL HIGH (ref 0–40)

## 2010-04-26 MED ORDER — CYCLOBENZAPRINE HCL 5 MG PO TABS: 10.0000 mg | ORAL_TABLET | Freq: Three times a day (TID) | ORAL | Status: DC | PRN

## 2010-04-26 MED ORDER — FLUOXETINE HCL 40 MG PO CAPS: 40.0000 mg | ORAL_CAPSULE | ORAL | Status: DC

## 2010-04-26 NOTE — Assessment & Plan Note (Signed)
Will discuss with PCP.

## 2010-04-26 NOTE — Assessment & Plan Note (Signed)
The pt's psychiatrist would like him to cont this. I will give 1 refill, then f/u with PCP for further mgt.

## 2010-04-26 NOTE — Assessment & Plan Note (Signed)
Cont current medical regimen. Increased flexeril to 10mg  TID. Pt to take amitriptyline more regularly. He will call his PM&R MD to schedule appt. RTC prn.

## 2010-04-26 NOTE — Progress Notes (Signed)
  Subjective:    Patient ID: Lance Luna, male    DOB: 10/10/1970, 40 y.o.   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  Notes flexeril helps a lot.    I called his PM&R MD and he will be seen, pt just needs to call to make appt.  Cardiac:  CP clearly non-cardiac, appt with Dr. Marni Griffon coming up.  Harwani would like [place] to check CMET and FLP.  Psoriasis:  Pt notes some improvement with calcipotriene.  Review of Systems    See HPI Objective:   Physical Exam  Constitutional: He appears well-developed and well-nourished. No distress.  Cardiovascular: Normal rate, regular rhythm and normal heart sounds.  Exam reveals no gallop and no friction rub.   No murmur heard. Pulmonary/Chest: Effort normal and breath sounds normal. No respiratory distress. He has no wheezes. He has no rales.  Musculoskeletal:       Still with positive spurlings sign on the L with reproduction of CP symptoms into upper chest.  Skin: Skin is warm and dry.          Assessment & Plan:

## 2010-04-26 NOTE — Assessment & Plan Note (Addendum)
Checking FLP and CMET. Will forward bloodwork to Dr. Sharyn Lull.

## 2010-04-26 NOTE — Patient Instructions (Signed)
Checking bloodwork. Be sure to take your amitriptyline as directed. Make appt to see Dr. Hulen Luster regarding your blood pressure.  -Dr. Karie Schwalbe.

## 2010-04-26 NOTE — Assessment & Plan Note (Signed)
Better with calcipotriene. Next choice would be topical coal tar if fails calcipotriene after 3 months. Also consider topical calcineurin inhibitors and methotrexate.

## 2010-05-07 ENCOUNTER — Telehealth: Payer: Self-pay | Admitting: Family Medicine

## 2010-05-07 DIAGNOSIS — M5412 Radiculopathy, cervical region: Secondary | ICD-10-CM

## 2010-05-07 NOTE — Telephone Encounter (Signed)
Pt wants to know if we have sent records to the pain clinic yet?

## 2010-05-07 NOTE — Telephone Encounter (Signed)
I dont see anything about this.  It looks like he was supposed to see PM and R doctor who should not need referral.  I think that is the doctor he had brought in a letter about

## 2010-05-07 NOTE — Telephone Encounter (Signed)
I dont see a referral to pain clinic, will forward to MD to see if she knows anything about this.

## 2010-05-08 ENCOUNTER — Ambulatory Visit (INDEPENDENT_AMBULATORY_CARE_PROVIDER_SITE_OTHER): Payer: 59 | Admitting: Psychiatry

## 2010-05-08 DIAGNOSIS — F209 Schizophrenia, unspecified: Secondary | ICD-10-CM

## 2010-05-08 NOTE — Telephone Encounter (Signed)
Will place PM&R referral to Dr. Murray Hodgkins.

## 2010-05-08 NOTE — Telephone Encounter (Signed)
Spoke with patient, he states that Dr. Karie Schwalbe was supposed to be sending him to Dr. Murray Hodgkins for a nerve study? I dont see an order will forward to Dr. Karie Schwalbe.

## 2010-05-13 ENCOUNTER — Ambulatory Visit (INDEPENDENT_AMBULATORY_CARE_PROVIDER_SITE_OTHER): Payer: 59 | Admitting: Family Medicine

## 2010-05-13 ENCOUNTER — Encounter: Payer: Self-pay | Admitting: Family Medicine

## 2010-05-13 VITALS — BP 138/88 | HR 88 | Temp 97.9°F | Ht 72.0 in | Wt 339.9 lb

## 2010-05-13 DIAGNOSIS — I10 Essential (primary) hypertension: Secondary | ICD-10-CM

## 2010-05-13 DIAGNOSIS — R7401 Elevation of levels of liver transaminase levels: Secondary | ICD-10-CM

## 2010-05-13 DIAGNOSIS — E669 Obesity, unspecified: Secondary | ICD-10-CM

## 2010-05-13 MED ORDER — HYDROCHLOROTHIAZIDE 12.5 MG PO CAPS: 12.5000 mg | ORAL_CAPSULE | Freq: Every day | ORAL | Status: DC

## 2010-05-13 NOTE — Patient Instructions (Addendum)
It was nice to see you today Please fill the new medication: HCTZ Come back in 2 weeks for a nurse visit for blood pressure check and to have your blood work checked again Try to avoid salt and processed foods in your diet!!! Also exercise some every day!

## 2010-05-13 NOTE — Assessment & Plan Note (Signed)
Discussed diet and exercise interventions 

## 2010-05-13 NOTE — Progress Notes (Signed)
  Subjective:    Patient ID: Lance Luna, male    DOB: October 02, 1970, 40 y.o.   MRN: [id number]  HPI  Pt presents for eval of elevated BPs at previous visits with other providers.  Pt has gone to [employer] to check BP but does not remember his results.  Denies recent chest pain, HA, blurry vision, SOB.   Pt states that he is trying to walk daily.  He cooks for himself and often uses canned foods.  Review of Systems See above    Objective:   Physical Exam    Vital signs reviewed General appearance - alert, well appearing, and in no distress and oriented to person, place, and time Chest - clear to auscultation, no wheezes, rales or rhonchi, symmetric air entry, no tachypnea, retractions or cyanosis Heart - normal rate, regular rhythm, normal S1, S2, no murmurs, rubs, clicks or gallops     Assessment & Plan:

## 2010-05-13 NOTE — Assessment & Plan Note (Signed)
Elevated isolated ALT.  Will recheck in 6 months.  Likely due to obesity.   Lab Results  Component Value Date   ALT 71* 04/26/2010

## 2010-05-13 NOTE — Assessment & Plan Note (Addendum)
BP Readings from Last 3 Encounters:  05/13/10 138/88  04/26/10 152/97  04/22/10 132/89   Wt Readings from Last 3 Encounters:  05/13/10 339 lb 14.4 oz (154.178 kg)  04/26/10 335 lb (151.955 kg)  04/22/10 340 lb 3.2 oz (154.314 kg)   Pt reminded about diet and exercise interventions.  To start HCTZ and see back in 2 weeks for BP check and blood work framingham risk is 7.9% for 10 years.  Will stop ASA at next visit. Cath in 09 was clean.

## 2010-05-15 ENCOUNTER — Ambulatory Visit (INDEPENDENT_AMBULATORY_CARE_PROVIDER_SITE_OTHER): Payer: 59 | Admitting: Psychiatry

## 2010-05-15 DIAGNOSIS — F209 Schizophrenia, unspecified: Secondary | ICD-10-CM

## 2010-05-22 ENCOUNTER — Ambulatory Visit (INDEPENDENT_AMBULATORY_CARE_PROVIDER_SITE_OTHER): Payer: 59 | Admitting: Psychiatry

## 2010-05-22 DIAGNOSIS — F209 Schizophrenia, unspecified: Secondary | ICD-10-CM

## 2010-05-27 ENCOUNTER — Encounter: Payer: Self-pay | Admitting: *Deleted

## 2010-05-27 ENCOUNTER — Ambulatory Visit (INDEPENDENT_AMBULATORY_CARE_PROVIDER_SITE_OTHER): Payer: Medicare Other | Admitting: *Deleted

## 2010-05-27 VITALS — BP 130/90 | HR 80

## 2010-05-27 DIAGNOSIS — I10 Essential (primary) hypertension: Secondary | ICD-10-CM

## 2010-05-27 NOTE — Progress Notes (Signed)
In for BP check.  BP checked manually using large adult cuff. BP  left arm130/90, RA 140/94 pulse 80. Patient is taking HCTZ as directed at last visit.  Will forward message to MD  For further instructions. Call back # [phone].

## 2010-05-29 ENCOUNTER — Ambulatory Visit (INDEPENDENT_AMBULATORY_CARE_PROVIDER_SITE_OTHER): Payer: 59 | Admitting: Psychiatry

## 2010-05-29 DIAGNOSIS — F209 Schizophrenia, unspecified: Secondary | ICD-10-CM

## 2010-05-29 NOTE — Progress Notes (Signed)
Will forward to Dr. Alvester Morin in Dr. Jeri Lager absence.

## 2010-05-30 ENCOUNTER — Telehealth: Payer: Self-pay | Admitting: Family Medicine

## 2010-05-30 NOTE — Telephone Encounter (Signed)
Lance Luna need to know if med recs were sent to Dr. Chari Manning office.

## 2010-05-31 NOTE — Progress Notes (Signed)
Pt desiring medical records to be faxed/sent over to [facility]. Is wondering if information has been sent over yet.  ROI has been signed per pt. Told pt that we attempt to send records. Pt agreeable.   To Admin Team: Please fax over medical record if possible.  [facility] Contact number [phone] (fax number not present)

## 2010-05-31 NOTE — Progress Notes (Signed)
Dr. Alvester Morin states he spoke with patient regarding BP. Follow up with MD as planned.

## 2010-05-31 NOTE — Telephone Encounter (Signed)
Garen Grams LPN states she faxed records . Patient notified.

## 2010-06-03 LAB — BASIC METABOLIC PANEL
BUN: 6 mg/dL (ref 6–23)
CO2: 26 mEq/L (ref 19–32)
Chloride: 105 mEq/L (ref 96–112)
Creatinine, Ser: 0.84 mg/dL (ref 0.4–1.5)
Glucose, Bld: 92 mg/dL (ref 70–99)

## 2010-06-03 LAB — LIPID PANEL
Cholesterol: 189 mg/dL (ref 0–200)
Total CHOL/HDL Ratio: 6.3 RATIO

## 2010-06-03 LAB — HEPATIC FUNCTION PANEL
Albumin: 3.9 g/dL (ref 3.5–5.2)
Alkaline Phosphatase: 68 U/L (ref 39–117)
Indirect Bilirubin: 0.8 mg/dL (ref 0.3–0.9)
Total Bilirubin: 1 mg/dL (ref 0.3–1.2)

## 2010-06-05 ENCOUNTER — Ambulatory Visit (INDEPENDENT_AMBULATORY_CARE_PROVIDER_SITE_OTHER): Payer: 59 | Admitting: Psychiatry

## 2010-06-05 DIAGNOSIS — F209 Schizophrenia, unspecified: Secondary | ICD-10-CM

## 2010-06-07 ENCOUNTER — Encounter: Payer: Self-pay | Admitting: Family Medicine

## 2010-06-07 ENCOUNTER — Ambulatory Visit (INDEPENDENT_AMBULATORY_CARE_PROVIDER_SITE_OTHER): Payer: 59 | Admitting: Family Medicine

## 2010-06-07 DIAGNOSIS — I10 Essential (primary) hypertension: Secondary | ICD-10-CM

## 2010-06-07 DIAGNOSIS — H109 Unspecified conjunctivitis: Secondary | ICD-10-CM

## 2010-06-07 DIAGNOSIS — L409 Psoriasis, unspecified: Secondary | ICD-10-CM

## 2010-06-07 DIAGNOSIS — L408 Other psoriasis: Secondary | ICD-10-CM

## 2010-06-07 LAB — COMPREHENSIVE METABOLIC PANEL
AST: 27 U/L (ref 0–37)
Albumin: 4.6 g/dL (ref 3.5–5.2)
BUN: 8 mg/dL (ref 6–23)
Calcium: 10.1 mg/dL (ref 8.4–10.5)
Chloride: 101 mEq/L (ref 96–112)
Potassium: 4.1 mEq/L (ref 3.5–5.3)

## 2010-06-07 MED ORDER — POLYMYXIN B-TRIMETHOPRIM 10000-0.1 UNIT/ML-% OP SOLN: 1.0000 [drp] | Freq: Four times a day (QID) | OPHTHALMIC | Status: AC

## 2010-06-07 NOTE — Progress Notes (Signed)
  Subjective:    Patient ID: Lance Luna, male    DOB: 25-Mar-1970, 40 y.o.   MRN: [id number]  HPI Eye pain and drainage-  For 2 days, has had pain and drainage, wakes up with crusting, eye stuck closed.  No fever, no vision changes.  Psoriasis- uses cream intermittently, still having bad skin rash on hands and on leg  HTN- using med, no problems.  Avoiding canned foods, trying to eat more frozen veggies  Review of Systems No fever, no CP, no SOB    Objective:   Physical Exam Vital signs reviewed General appearance - alert, well appearing, and in no distress and oriented to person, place, and time Eyes - pupils equal and reactive, extraocular eye movements intact, left conjunctiva red, crusting present on eyelid.  Vision screen performed, 20/30 on right eye and bilateral, 20/40 left Heart - normal rate, regular rhythm, normal S1, S2, no murmurs, rubs, clicks or gallops Chest - clear to auscultation, no wheezes, rales or rhonchi, symmetric air entry, no tachypnea, retractions or cyanosis Skin- psoriatic patches on bilateral hands and large patch on right leg calf with excoriations        Assessment & Plan:

## 2010-06-07 NOTE — Assessment & Plan Note (Signed)
Essentially at goal.  Continue to emphasize diet modifications and weight loss.  Recheck CMET today

## 2010-06-07 NOTE — Patient Instructions (Signed)
If your eye isn't better by Monday, go to see your eye doctor We will send your dermatology referral in See you in 3 months to check on your blood pressure

## 2010-06-07 NOTE — Assessment & Plan Note (Signed)
Using cream intermittently.  Still with significant lesions.  Has not seen derm in some time.  Will refer baack

## 2010-06-07 NOTE — Assessment & Plan Note (Signed)
Bacterial vs viral.  Will get polytrim drops.  Vision is intact.  Symptoms consistent with conjunctivitis rather than scratch or other problem.  Pt to see his opthalmologist if not better on Monday.  Given sheet with red flags.

## 2010-06-10 ENCOUNTER — Encounter: Payer: Self-pay | Admitting: *Deleted

## 2010-06-12 ENCOUNTER — Ambulatory Visit (INDEPENDENT_AMBULATORY_CARE_PROVIDER_SITE_OTHER): Payer: 59 | Admitting: Psychiatry

## 2010-06-12 DIAGNOSIS — F209 Schizophrenia, unspecified: Secondary | ICD-10-CM

## 2010-06-19 ENCOUNTER — Ambulatory Visit (INDEPENDENT_AMBULATORY_CARE_PROVIDER_SITE_OTHER): Payer: 59 | Admitting: Psychiatry

## 2010-06-19 DIAGNOSIS — F209 Schizophrenia, unspecified: Secondary | ICD-10-CM

## 2010-06-24 ENCOUNTER — Telehealth: Payer: Self-pay | Admitting: Family Medicine

## 2010-06-24 NOTE — Telephone Encounter (Signed)
i suspect that this was recommended by Dr. Chari Manning office.  He should call them and ask what to do next if he cannot get study.

## 2010-06-24 NOTE — Telephone Encounter (Signed)
Pt says he was advised to get a neuro study done, insurance does not cover it, wants to know what other options he has? Pt has [place].

## 2010-06-26 ENCOUNTER — Ambulatory Visit (INDEPENDENT_AMBULATORY_CARE_PROVIDER_SITE_OTHER): Payer: 59 | Admitting: Psychiatry

## 2010-06-26 DIAGNOSIS — F209 Schizophrenia, unspecified: Secondary | ICD-10-CM

## 2010-07-02 NOTE — Procedures (Signed)
NAME:  Lance Luna, Lance Luna NO.:  [id number]   MEDICAL RECORD NO.:  [id number]          PATIENT TYPE:  OUT   LOCATION:  SLEEP CENTER                 FACILITY:  [facility]   PHYSICIAN:  Clinton D. Maple Hudson, MD, FCCP, FACPDATE OF BIRTH:  October 12, 1970   DATE OF STUDY:  12/21/2007                            NOCTURNAL POLYSOMNOGRAM   REFERRING PHYSICIAN:  Alanda Amass, M.D.   REFERRING PHYSICIAN:  Dr. Alanda Amass, MD   INDICATION FOR STUDY:  Hypersomnia with sleep apnea.   EPWORTH SLEEPINESS SCORE:  Epworth sleepiness score 11/24.  BMI 42.4.  Weight 313 pounds.  Height 72 inches.  Neck 18.5 inches.   MEDICATIONS:  Home medications are charted and reviewed.   SLEEP ARCHITECTURE:  Total sleep time was 442 minutes with sleep  efficiency 97.2%.  Stage I was 2%.  Stage II  77%.  Stage III was  absent.  REM 20% of total sleep time.  Sleep latency is 8.5 minutes.  REM latency 99 minutes.  Wake after sleep onset 4 minutes.  Arousal  index 13.  The patient took trazodone and metoprolol at 9:30 p.m.   RESPIRATORY DATA:  Apnea-hypopnea index (AHI) 14.8 per hour.  A total of  109 events were counted including 7 obstructive apneas, 4 central  apneas, and 98 hypopneas.  Events were nonpositional.  REM AHI 46.8 per  hour.  There were insufficient early events to permit CPAP titration by  split protocol on the study night.  Most events were recorded later in  the night.   OXYGEN DATA:  Moderate snoring with oxygen desaturation to a nadir of  77%.  Mean oxygen saturation through the study was 92.9% on room air.   CARDIAC DATA:  Normal sinus rhythm.   MOVEMENT/PARASOMNIA:  No significant movement disturbance.  No bathroom  trips.   IMPRESSIONS/RECOMMENDATIONS:  1. 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  2. Criteria for split  protocol continuous positive airway pressure      titration was not achieved on the study night      because of late onset of events.  Consider return for continuous      positive airway pressure titration or evaluate for alternative      management as appropriate.      Clinton D. Maple Hudson, MD, [facility], FACP  Diplomate, Biomedical engineer of Sleep Medicine  Electronically Signed     CDY/MEDQ  D:  12/25/2007 16:24:46  T:  12/26/2007 01:49:25  Job:  [id number]

## 2010-07-02 NOTE — Procedures (Signed)
NAME:  Lance Luna, Lance Luna NO.:  [id number]   MEDICAL RECORD NO.:  [id number]          PATIENT TYPE:  OUT   LOCATION:  SLEEP CENTER                 FACILITY[facility]   PHYSICIAN:  Clinton D. Maple Hudson, MD, FCCP, FACPDATE OF BIRTH:  February 08, 1971   DATE OF STUDY:  01/17/2008                            NOCTURNAL POLYSOMNOGRAM   REFERRING PHYSICIAN:  Dr. Cyndia Bent   REFERRING PHYSICIAN:  Dr. Cyndia Bent.   INDICATION FOR STUDY:  Hypersomnia with sleep apnea.   EPWORTH SLEEPINESS SCORE:  11/24.  BMI 32.4.  Weight 313 pounds.  Height  72 inches.  Neck 18.5 inches.   MEDICATIONS:  Home medications are charted and reviewed.   Baseline diagnostic NPSG on 12/21/2007 recorded an AHI of 4.8 per hour.  He returns for CPAP titration.   SLEEP ARCHITECTURE:  Total sleep time 377 minutes with sleep efficiency  95.6%.  Stage I was 3.4%.  Stage II 69.6%.  Stage III absent.  REM 26.9%  of total sleep time.  Sleep latency 6 minutes.  REM latency 54.5  minutes.  Awake after sleep onset 11.5 minutes.  Arousal index 9.7.  Trazodone and metoprolol were taken at 10 p.m.   RESPIRATORY DATA:  CPAP titration protocol.  CPAP was titrated to 16  CWP, AHI 0 per hour.  He chose a large ResMed Quattro full face mask  with heated humidifier.   OXYGEN DATA:  Moderate snoring before CPAP control resolved by CPAP with  oxygen saturation on CPAP holding mean 93.4% through the study on room  air.   CARDIAC DATA:  Sinus rhythm with occasional PAC.   MOVEMENT-PARASOMNIA:  No significant movement disturbance.  No rest room  trips.   IMPRESSIONS-RECOMMENDATIONS:  1. Successful CPAP titration to 16 CWP, AHI 0 per hour.  He shows a      large ResMed Quattro full face mask with      heated humidifier.  2. Baseline diagnostic NPSG on 12/21/2007 recorded AHI 14.8 per hour.      Clinton D. Maple Hudson, MD, [facility], FACP  Diplomate, Biomedical engineer of Sleep Medicine  Electronically Signed     CDY/MEDQ   D:  01/22/2008 10:30:51  T:  01/23/2008 00:25:57  Job:  [id number]

## 2010-07-02 NOTE — Assessment & Plan Note (Signed)
[facility]                            CARDIOLOGY OFFICE NOTE   NAME:Lance Luna, Lance Luna                       MRN:          [id number]  DATE:03/31/2007                            DOB:          [birth year]    Mr. Lance Luna was seen for cardiac evaluation on March 10, 2007.  See my  complete note.  At that time he gave a history of chest pain.  It was  concerning but did not at that time seem compatible with ischemic  disease. We went ahead and did an adenosine Myoview scan.  I have  personally reviewed this report.  The patient has moderate decrease in  activity at the apical cap.  There is no significant reversibility.  This area is larger than normally seen for apical thinning.  It suggests  scar in this area.  Wall motion by nuclear study did not appear to be  significantly abnormal.  The patient is seen back today.  He continues  to have chest pain.  Because of this I feel we now need to be more  aggressive to assess him further.   PAST MEDICAL HISTORY:   ALLERGIES:  No known drug allergies.   MEDICATIONS:  Gabapentin, trazodone, metoprolol, omeprazole, fluoxetine,  methocarbamol.   OTHER MEDICAL PROBLEMS:  See the list below.   REVIEW OF SYSTEMS:  Other than the HPI his review of systems is  negative.   PHYSICAL EXAM:  Weight is 313 pounds.  Blood pressure is 122/78.  The  pulse is 72.  The patient is oriented to person, time and place.  Affect  is normal.  HEENT:  Reveals no xanthelasma.  There is normal extraocular motion.  There are no carotid bruits.  There is no jugular venous distention.  LUNGS:  Clear.  Respiratory effort is not labored.  CARDIAC:  Reveals an S1 with an S2.  There were no clicks or significant  murmurs.  ABDOMEN:  Soft.  He has no peripheral edema.   Information is now available to me that was not available when I saw him  on March 10, 2007.  In fact, he had a 2-D echo that had been done on  February 26, 2007.  This  study had shown an ejection fraction of 60%.  However, it was inadequate for the evaluation of wall motion  abnormalities.  In fact is also he had been admitted to [facility] with chest pain.  He was discharged on February 28, 2007 and  there was no myocardial infarction.   As mentioned, he did have a nuclear scan which shows marked decreased  activity in the apical cap.   PROBLEMS:  1. Disability related to his back.  2. History of some type of mild mental retardation for which I do not      have any further information.  3. History of hypertension stable.  4. Status post neck surgery in the past.  5. Carpal tunnel.  6. Status post appendectomy.  7. History of some anxiety.  8. Chest pain.  With his abnormal Myoview scan, we need to  proceed      with catheterization.  The finding is unusual.  I am not convinced      that he has significant cardiac disease at this point but the      nuclear scan is abnormal and we need to assess this further to      understand it.  Outpatient cath will be arranged.     Luis Abed, MD, [facility]  Electronically Signed    JDK/MedQ  DD: 03/31/2007  DT: 04/02/2007  Job #: [id number]   cc:   Alanda Amass, M.D.  Dr. Cardell Peach

## 2010-07-02 NOTE — H&P (Signed)
NAME:  Lance Luna, Lance Luna NO.:  [id number]   MEDICAL RECORD NO.:  [id number]          PATIENT TYPE:  EMS   LOCATION:  MAJO                         FACILITY:  [facility]   PHYSICIAN:  Santiago Bumpers. Hensel, M.D.DATE OF BIRTH:  [birth year]   DATE OF ADMISSION:  03/03/2007  DATE OF DISCHARGE:  03/03/2007                              HISTORY & PHYSICAL   Alanda Amass dictating for Dr. Leveda Anna.   CHIEF COMPLAINT:  The patient felt hot, breathes heavy and is having  left chest pain off and on.   HISTORY OF PRESENT ILLNESS:  The patient is a 40 year old Caucasian male  with mental retardation, hypertension and anxiety, who presents with  chest pain with a history of frequent doctor visits and calls. He has  been having episodes of chest pain off and on now for three months when  he walks his dog. These have been increasing in frequency. He is  currently having chest pain now. When he has chest pain,  he feels hot  and flushed and diaphoretic and also has some nausea. The pain is in the  left side of his chest and feels like pressure. Per the RN, his pain is  a 10 out of 10 in his left chest. It also radiates into his left arm.  Family history of heart disease in his family. It is in his grandmother.  The patient does not have a smoking history. By the end of our visit,  his chest pain was gone. He did have his EKG while he was having chest  pain, which showed sinus bradycardia and no ST changes.   ALLERGIES:  None.   PAST MEDICAL HISTORY:  1. The patient has a history of back and neck pain. History of a      problem with a disk in his neck.  2. Hypertension.  3. GERD.  4. Pain management. He sees Dr. Laneta Simmers, who prescribes him Neurontin.  5. The patient also sees Dr. Beverely Risen at [facility], and she prescribes him Prozac and Trazodone.  He also sees      a Veterinary surgeon every week. He has a neurologist, Dr.  Wynetta Emery, who was      going to do an MRI of his  neck and brain, but we do not have these      records here.  6. Also a history of headache.  7. Psoriasis.  8. In 2002, here with H. pylori positive and treated.  9. History of multiple muscle strains.   PAST SURGICAL HISTORY:  He has had a diskectomy by Dr. Wynetta Emery in 2003. He  had an abnormal EMG nerve conduction study as well for carpal tunnel.  Also had an appendectomy in 2002.   FAMILY HISTORY:  Mom has migraines. Grandmother has a history of MI,  otherwise there are no other problems in the family.   SOCIAL HISTORY:  The patient is currently on disability for a ruptured  disk in his back. Denies alcohol and is a nonsmoker. Does not use any  drugs.   PHYSICAL EXAMINATION:  GENERAL: In general, the patient is alert and  overweight appearing.  NECK: Is supple, no masses, no thyromegaly.  CHEST WALL: No deformities, but does have chest wall tenderness over the  left chest that does go into his left arm when you press on it.  LUNGS: Normal respiratory rate, normal breath sounds, no crackles and no  wheezes.  HEART: Regular rate and rhythm, no murmurs, rubs or gallops.  ABDOMEN: Soft and nontender, very obese.  EXTREMITIES: Pulses +2 radial pulses. No lower extremity edema.  VITAL SIGNS: Weight 311 pounds. Temperature 98.7. Pulse 57. Blood  pressure 121/81.   ASSESSMENT/PLAN:  The patient is a 40 year old with a history of  hypertension and obesity. Here today for chest pain, rule out MI.  1. Chest pain. Will admit this patient to a telemetry bed to rule out      MI. This is likely due to costochondritis given his chest      tenderness. It is also a possibility. Will start Protonix 40 mg      daily. EKG was normal here during his chest pain, except for some      bradycardia likely due to beta blocker, but he is currently chest      pain free.  We will continue his Metoprolol two times a day and      start aspirin, get cardiac enzymes, check a TSH, check a B-met and      also do a  fasting lipid panel in the morning. We will also check an      EKG in the morning. If he rules out, we will consider an outpatient      workup with cardiology.  2. Anxiety. This is unchanged. We will continue his Prozac at his home      dose of 40 mg daily.  3. Hypertension. This is unchanged since blood pressure is well      controlled on the Metoprolol, will continue.  4. Headaches. The patient is not currently complaining of headaches,      but does have a history of these for which he takes Ketorolac as      needed, but does not need this very much. We will continue adding      this back if he does have a headache.  5. Also there is a history of muscle spasms and takes Methocarbamol as      needed for this. We will not order these at this time and see how      he does.   A complete medication list of this patient this times includes.  1. Ketorolac 10 mg one tablet by mouth at onset of his headache in 4      to 6 hours if not improvement.  2. Methocarbamol 500 mg one table q.8 hours as needed for muscle      spasm.  3. Neurontin 300 mg take two tablets by mouth three times a day as      needed.  4. Omeprazole 20  mg take one tablet by mouth daily.  5. Prozac 40 mg take one tablet by mouth daily.  6. Trazodone 100 mg to take two tablets by mouth every night.  7. Metoprolol 50 mg one tablet two times a day.  8. Nabumetone 750 mg take one tablet by mouth two times a day.      Alanda Amass, M.D.  Electronically Signed      Santiago Bumpers. Leveda Anna, M.D.  Electronically Signed    JH/MEDQ  D:  03/04/2007  T:  03/04/2007  Job:  [id number]

## 2010-07-02 NOTE — Assessment & Plan Note (Signed)
[facility]                            CARDIOLOGY OFFICE NOTE   NAME:Lance Luna, SHO SALGUERO                       MRN:          [id number]  DATE:04/21/2007                            DOB:          1971/01/24    This is a patient of Dr. Myrtis Ser.  This is a 40 year old white male patient  who Dr. Myrtis Ser initially saw with chest pain, who underwent a Cardiolite  that showed moderate decrease in activity in the apical cap, no  ischemia, could suggest scar.  The patient continues to have chest pain  so was admitted and underwent cardiac catheterization that showed normal  coronaries and normal LV function, ejection fraction of 65% with normal  wall motion.  No further cardiac workup was suggested.   The patient continues to have constant chest pain.  Today, he saw Dr.  Melynda Ripple, who told him it was a muscle, but the patient insists that it is  not and that it could still be his heart.   CURRENT MEDICATIONS:  1. Gabapentin 300 mg 2 t.i.d.  2. Trazodone 100 mg two nightly.  3. Metoprolol 50 mg b.i.d.  4. Omeprazole 20 mg daily.  5. Fluoxetine 40 mg daily.  6. Methocarbamol 500 mg 2 to 3 every 8 hours.  7. Clobetasol propionate 5% ointment.   PHYSICAL EXAM:  This is a 40 year old white male in no acute distress.  Blood pressure 114/84, pulse 65, weight 315.  NECK:  Without JVD, HJR bruit.  Thyroid enlargement.  LUNGS:  Clear anterior posterior lateral.  HEART:  Regular rate and rhythm at 65 beats per minute.  Normal S1-S2.  No murmur, rub, bruit, thrill or heave noted.  Chest is nontender to palpation.  Abdomen is obese.  Normoactive bowel sounds heard throughout.  Right  groin is without hematoma or hemorrhage.  LOWER EXTREMITIES:  Without cyanosis or edema. Has good distal pulses.   IMPRESSION:  1. Chest pain, question etiology, noncardiac in origin.  2. Normal coronary arteries and left ventricular function on cardiac      catheterization.  3. Hypertension.  4. History of chronic back and neck pain with problem disk in his      neck.  5. History of mental retardation.  6. Anxiety.  7. Hypertension.  8. Gastroesophageal reflux disease.  9. History of headaches.   PLAN:  At this time I have asked the patient to follow up with Dr. Melynda Ripple  concerning his chest pain and any further workup.      Jacolyn Reedy, PA-C  Electronically Signed      Arturo Morton. Riley Kill, MD, [facility]  Electronically Signed   ML/MedQ  DD: 04/21/2007  DT: 04/21/2007  Job #: [id number]   cc:   Alanda Amass, M.D.

## 2010-07-02 NOTE — Assessment & Plan Note (Signed)
[facility]                            CARDIOLOGY OFFICE NOTE   NAME:Debruler, VERON SENNER                       MRN:          [id number]  DATE:05/17/2007                            DOB:          November 13, 1970    Mr. Biby returns again with chest pain.  We know from cardiac  catheterization that he has normal coronary arteries.  I believe this  pain is not cardiac.  He says he has persistent discomfort in his left  anterior chest.  This is most probably musculoskeletal and will have the  follow along with Mr. Kops and primary care.   PAST MEDICAL HISTORY:   ALLERGIES:  No known drug allergies.   MEDICATIONS:  1. Gabapentin.  2. Trazodone.  3. Metoprolol.  4. Omeprazole.  5. Fluoxetine.  6. Methocarbamol.   OTHER MEDICAL PROBLEMS:  See the list on the note of April 21, 2007.   REVIEW OF SYSTEMS:  Review of systems is negative other than his chest  discomfort.   PHYSICAL EXAMINATION:  Weight is 313 pounds.  Blood pressure 122/74 with  a pulse of 75.  The patient is oriented to person, time, and place.  His  affect appears depressed.  HEENT:  Reveals no xanthelasma.  He has normal extraocular motion.  There are no carotid bruits.  There is no jugular venous distention.  LUNGS:  Are clear.  Respiratory effort is not labored.  CARDIAC EXAM:  Reveals with a S1-S2.  There are no clicks or significant  murmurs.  There is no peripheral edema.   Mr. Gluth problems are listed on the note of April 21, 2007.  Unfortunately, he has continued chest pain.  It is not cardiac.  He  needs no further cardiac evaluation.  I explained this to him carefully.  No further follow-up is arranged.     Luis Abed, MD, [facility]  Electronically Signed    JDK/MedQ  DD: 05/17/2007  DT: 05/17/2007  Job #: [id number]   cc:   Alanda Amass, M.D.

## 2010-07-02 NOTE — Cardiovascular Report (Signed)
NAME:  Lance Luna, Lance Luna NO.:  [id number]   MEDICAL RECORD NO.:  [id number]          PATIENT TYPE:  OIB   LOCATION:  1961                         FACILITY:  [facility]   PHYSICIAN:  Rollene Rotunda, MD, FACCDATE OF BIRTH:  [birth year]   DATE OF PROCEDURE:  DATE OF DISCHARGE:  04/08/2007                            CARDIAC CATHETERIZATION   PRIMARY CARE PHYSICIAN:  Alanda Amass, MD   CARDIOLOGIST:  Luis Abed, MD   PROCEDURE:  Left heart catheterization/coronary arteriography.   INDICATIONS:  Evaluate patient with chest pain and a Cardiolite  suggesting apical ischemia.   PROCEDURAL NOTE:  Left heart catheterization was performed via the right  femoral artery.  The artery was cannulated using anterior wall puncture.  A #4-French arterial sheath was inserted via the modified Seldinger  technique, a preformed Judkins pigtail catheter utilized.  The patient  tolerated the procedure well and left the lab in stable condition.   RESULTS:   HEMODYNAMICS:  LV at 117/10, AO 115/93.   CORONARY ARTERIES:  Left main was normal.  The LAD was large, wrapping  the apex and normal.  There were a couple of small diagonals which were  normal.  The circumflex in the AV groove was normal.  There was a  moderate-sized ramus intermediate, which was normal.  OM-1 was moderate  to large and normal.  OM-2 was large and normal.  The right coronary  artery was a dominant vessel.  It was normal.  PDA was large and normal.   LEFT VENTRICULOGRAM:  A left ventriculogram was obtained in the RAO  projection.  The EF was 65% with normal wall motion.   CONCLUSION:  1. Normal coronary arteries.  2. Normal left ventricular function.   PLAN:  No further cardiac workup is suggested.  The patient will follow  back with his primary care physician for evaluation of nonanginal chest  pain.      Rollene Rotunda, MD, [facility]  Electronically Signed     JH/MEDQ  D:  04/08/2007  T:  04/09/2007   Job:  [id number]   cc:   Alanda Amass, M.D.  Luis Abed, MD, [facility]

## 2010-07-02 NOTE — Assessment & Plan Note (Signed)
[facility]                            CARDIOLOGY OFFICE NOTE   NAME:Lance Luna                       MRN:          [id number]  DATE:03/10/2007                            DOB:          1970-11-02    Mr. Lance Luna is referred for cardiac evaluation.  He is 40 years  old, and he has had recurring and continued chest discomfort.  There has  been no proof at this point of cardiac problems.  He is referred for  further evaluation.  His chest discomfort is fairly constant.  It seems  to be somewhat worse as he tries to lie down at night.  He is sleeping  somewhat upright in a recliner.  He describes the pain as in the center  of his chest and radiating down his left arm.  There is no nausea,  vomiting or diaphoresis.  There is no marked shortness of breath.  The  patient is significantly overweight.   PAST MEDICAL HISTORY:   ALLERGIES:  No known drug allergies.   MEDICATIONS:  Gabapentin, trazodone, metoprolol, omeprazole, fluoxetine,  methocarbamol, and clobetasol.   OTHER MEDICAL PROBLEMS:  See the list below.   SOCIAL HISTORY:  Patient is on disability related to back problems.  He  has some mild mental retardation.  I do not know the exact specifics.  He does wear a diaper because of accidents in the past.   FAMILY HISTORY:  There is no strong family history of coronary disease.   REVIEW OF SYSTEMS:  As of today, he is not having any major complaints  other than the HPI with some ongoing chest discomfort.  The patient has  had some headaches and some lightheadedness in the past.  Otherwise,  Review of Systems is negative.   PHYSICAL EXAMINATION:  VITAL SIGNS:  Blood pressure is 101/82 with a  pulse of 56.  GENERAL:  The patient is oriented to person, time and place.  His affect  reveals that his responses are slightly slow, but he responds  appropriately.  HEENT:  Reveals no xanthelasma.  He has normal extraocular motion.  NECK:   There are no carotid bruits.  There is no jugular venous  distention.  The patient is significantly overweight.  LUNGS:  Clear.  Respiratory effort is not labored.  CARDIAC:  Exam reveals S1-S2.  There are no clicks or significant  murmurs.  ABDOMEN:  Obese.  He has some striate in the skin in his abdomen.  EXTREMITIES:  He has no significant peripheral edema.   EKG is normal with mild sinus bradycardia.   PROBLEMS:  1. Disability related to back discomfort.  2. History of some type of mental retardation.  I cannot give any more      information about this.  3. History of hypertension.  This is stable at this time on      medication.  4. Status post neck surgery in the past by Dr. Wynetta Emery.  5. History of some gastrointestinal problems in the past.  6. Carpal tunnel.  7. Status post appendectomy.  8. History of some  anxiety.  9. Chest pain.   At this point, it seems most likely his chest pain is musculoskeletal.  However, it is somewhat positional.  I do not think he has pericarditis,  and an echocardiogram is needed to fully assess his LV function and be  sure he does not have significant pericardial effusion.  Also, he needs  an adenosine Myoview scan to be sure that he does not have marked  ischemia.  If these studies are negative, he will need no further  workup.   This information will be sent to Dr. Alanda Amass.  Will see the  patient back in followup.     Luis Abed, MD, [facility]  Electronically Signed    JDK/MedQ  DD: 03/10/2007  DT: 03/10/2007  Job #: [id number]   cc:   Alanda Amass, M.D.  Dr. Cardell Peach, [facility] [address]

## 2010-07-02 NOTE — Discharge Summary (Signed)
NAMEBRENTYN, SEEHAFER NO.:  [id number]   MEDICAL RECORD NO.:  [id number]          PATIENT TYPE:  INP   LOCATION:  2035                         FACILITY:  [facility]   PHYSICIAN:  Santiago Bumpers. Hensel, M.D.DATE OF BIRTH:  11-28-1970   DATE OF ADMISSION:  02/25/2007  DATE OF DISCHARGE:  02/28/2007                               DISCHARGE SUMMARY   HISTORY:  The patient is a 40 year old Caucasian male with slight mental  retardation, anxiety, history of multiple muscle strains and  hypertension.  He presented here with chest pain.   HOSPITAL COURSE:  1. Chest pain:  In regards to his chest pain, the patient was ruled      out for an MI.  Cardiac enzymes were done, 2 sets 8 hours apart,      and were completely negative.  EKG done during chest pain as well      as in the morning after admission showed normal sinus rhythm, no ST      changes, slightly bradycardic in the upper 50s.  On exam, the pain      the patient was experiencing was able to be replicated by pressing      on his chest.  When you pressed on his chest, it would radiate to      his arm.  He was placed on telemetry and had no events.  He was      also placed on a baby aspirin at 81 mg and a fasting lipid panel      was checked and he was started on 20 mg simvastatin due to his low      HDL and LDL of 135.  The chest pain was not relieved by      nitroglycerin and seemed to be very hard to control by any other      methods we used.  He ended up being discharged on tramadol 50 mg      p.o. every 6 hours p.r.n. pain as well as methocarbamol p.r.n.      muscle spasms but he was advised to stop Relafen.  The patient was      also increased on Protonix from 20 mg to 40 mg daily to make sure      that GERD was not contributing any to his pain.  Due to pain      control, the patient did stay an extra day in the hospital though      we felt that he probably would have been able to go home the day      before.  This  patient continues to call our clinic regarding his      chest pain and other issues likely due to his anxiety.   1. Anxiety:  The patient was continued on his Prozac and trazodone.      This is likely contributing to some of the problems he is having      with chest pain.  He is followed by mental health professionals.   1. Hypertension:  the patient was continued on metoprolol 50 mg b.i.d.  with good blood pressure control.  His heart rate was only slightly      bradycardic so we continued this during his hospital stay.   LABORATORY DATA:  CBC was completely normal on January 8.  Hemoglobin  was 14.7, white blood cell count 10.5, platelets 232.  Complete  metabolic panel was normal on admission except for a slightly elevated  ALT.  Cardiac enzymes were speckled and were normal x2.  TSH was normal  at 1.673.  Lipid profile showed a low HDL of 28 and LDL of 135.  Triglycerides were 128, cholesterol 189.   DIAGNOSTICS:  Studies done in the hospital included chest x-ray on  January 9 that showed cardiomegaly and pulmonary vascular congestion.  Echocardiogram done on February 27, 2007 showed overall left ventricular  systolic function was normal.  Left ejection fraction was estimated to  be 60%.  This study was inadequate for the evaluation of left  ventricular regional wall motion.  Left ventricular wall thickness was  mildly increased.  Left ventricular diastolic function parameters were  normal.   DISCHARGE DIAGNOSES:  1. Chest pain, likely musculoskeletal in nature.  2. Anxiety.  3. Slight mental retardation.  4. Hypertension.   DISCHARGE MEDICATIONS:  1. Neurontin 300 mg, takes 2 tablets three times per day.  2. Protonix, take 40 mg p.o. daily.  3. Prozac 40 mg p.o. daily.  4. Trazodone 100 mg, take 2 tablets at bed time.  5. Metoprolol 50 mg b.i.d.  6. Methocarbamol 500 mg take one tablet by mouth every 8 hours as      needed for muscle spasm.  7. Aspirin 81 mg p.o.  daily.  8. Simvastatin 20 mg p.o. daily.  9. Tramadol 50 mg p.o. every 6 hours p.r.n.   FOLLOWUP:  1. The patient will follow up with Dr. __________  or have a hospital      followup in the Hospital Followup Clinic on January 22 at 10 a.m.  2. Also the patient has been scheduled for an outpatient cardiology      appointment on January 21.   DISCHARGE CONDITION:  Stable.  He continues to have the same left-sided  chest and arm tenderness that worsens on palpation of his left chest and  completely replicates the pain.  This pain is not worsened or decreased  since his arrival here.   DISPOSITION:  The patient will be discharged home with followup as an  outpatient with Cardiology.  Also he will probably need an appointment  with his pain management doctor, Dr. Laneta Simmers.      Alanda Amass, M.D.  Electronically Signed      Santiago Bumpers. Leveda Anna, M.D.  Electronically Signed    JH/MEDQ  D:  03/04/2007  T:  03/04/2007  Job:  [id number]

## 2010-07-02 NOTE — Discharge Summary (Signed)
NAMEJACHAI, OKAZAKI NO.:  [id number]   MEDICAL RECORD NO.:  [id number]          PATIENT TYPE:  INP   LOCATION:  2035                         FACILITY:  [facility]   PHYSICIAN:  Rolm Gala, M.D.    DATE OF BIRTH:  01/13/1971   DATE OF ADMISSION:  02/25/2007  DATE OF DISCHARGE:  02/28/2007                               DISCHARGE SUMMARY   ADDENDUM:  This is an addendum to a discharge dictation done February 27, 2007.  The patient was kept overnight because his pain was not  controlled.  It was controlled with Toradol which he was sent home with,  Toradol 10 mg q.6h. p.r.n. pain.  The patient was stable at discharge.      Rolm Gala, M.D.  Electronically Signed     HG/MEDQ  D:  02/28/2007  T:  03/01/2007  Job:  [id number]

## 2010-07-03 ENCOUNTER — Ambulatory Visit (INDEPENDENT_AMBULATORY_CARE_PROVIDER_SITE_OTHER): Payer: 59 | Admitting: Psychiatry

## 2010-07-03 DIAGNOSIS — F209 Schizophrenia, unspecified: Secondary | ICD-10-CM

## 2010-07-05 NOTE — Op Note (Signed)
NAME:  KATLIN, CISZEWSKI                          ACCOUNT NO.:  [id number]   MEDICAL RECORD NO.:  [id number]                   PATIENT TYPE:  OIB   LOCATION:  3002                                 FACILITY:  [facility]   PHYSICIAN:  Donzetta Sprung. Wynetta Emery, M.D.                  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   PROCEDURE:  Anterior cervical diskectomy and fusion at C6-7 with 8 mm  patellar wedge and a 25 mm Atlantis plate with four 13 mm variable angle  screws.   SURGEON:  Donzetta Sprung. Wynetta Emery, M.D.   ASSISTANT:  Kathaleen Maser. Pool, M.D.   ANESTHESIA:  General endotracheal.   HISTORY OF PRESENT ILLNESS:  The patient is a very pleasant 40 year old  gentleman who has had longstanding neck and left arm greater than right arm  pain that radiates down his shoulder into his first two fingers of his left  hand.  This has been refractory to physical therapy and anti-inflammatories  and has been progressively worse over the last year apparently with  worsening cervical spondylitic disease with disk disruption at C6-7 with  widening at the anterior disk space as well as spinal ecchymosis at the left  C7 nerve root.  The patient was recommended an anterior cervical diskectomy  and fusion at C6-7 with bone grafting and plate.  The risks and benefits  were discussed and explained to the patient.  He understands and agrees to  proceed forward.   DESCRIPTION OF PROCEDURE:  The patient was brought into the OR and was  induced under general anesthesia.  Positioned supine with a shoulder roll  and the neck in slight extension and five pounds of halter traction.  The  right side of his neck was prepped and draped in the  usual sterile fashion.  After localizing of the needle at the C6-7 interspace, a curvilinear  incision was made just off the midline at the anterior sternocleidomastoid.  The superficial lobe was then dissected out and divided longitudinally and  we had excellent hemostasis.  The strap muscles were developed down to the  prevertebral fascia.  The prevertebral fascia was dissected with the  Kitners.  The longus colli was then reflected laterally.  A needle was  placed in the C5-6 and disk space confirmed with x-ray. Annulotomy was  opened at C6-7 with a 15 blade scalpel.  Pituitary rongeurs were used to  remove the anterior margin of the annulus.  The remainder of the longus  colli was reflected laterally and a self-retaining retractor was placed.  The operating microscope was draped and brought into the  field and under  microscope illumination, the anterior margin of the annulus and endplates  were drilled down with a high speed drill.  The disk and endplates were  noted to be degenerated at C6-7.  This was all removed in a piecemeal  fashion.  Then, there was a large posterior osteophyte coming off the C7  vertebral body.  This was under-bitten with 1-2 mm Kerrison punches,  exposing the posterior longitudinal ligament which was removed in a  piecemeal fashion, exposing the thecal sac.  The thecal sac was then  decompressed.  At the proximal aspect of both C7 neural foramina, there was  noted to a be large spondylitic compression at the left C7 nerve root from  the uncinate hypertrophy.  The endplates were then scraped with the curet.  The wound was copiously irrigated and meticulous hemostasis was maintained.  An 8 mm patellar wedge that had 2 mm cut off the depth and inserted under  compression, and then a 25 mm Atlantis plate was size selected and inserted  with four 13 mm variable angle screws with the holes being drilled and  tapped.  Postoperative x-ray confirmed good localization of  plate, screws,  and bone graft.  The wound was copiously irrigated and meticulous hemostasis  was maintained.  The platysma was reapproximated with 3-0 interrupted  Vicryl.  The skin was closed with running 4-0 subcuticular.  Benzoin and  Steri-Strips were applied.  The patient was returned to the recovery room in  stable condition.  At the end of the case, the needle, instrument, and  sponge count were correct.                                                 Jillyn Hidden P. Wynetta Emery, M.D.    GPC/MEDQ  D:  09/14/2001  T:  09/16/2001  Job:  [id number]

## 2010-07-05 NOTE — Discharge Summary (Signed)
Lance Luna, BUHL NO.:  [id number]   MEDICAL RECORD NO.:  [id number]          PATIENT TYPE:  INP   LOCATION:  4743                         FACILITY:  [facility]   PHYSICIAN:  Madeleine B. Vanstory, M.D.DATE OF BIRTH:  10-14-1970   DATE OF ADMISSION:  04/12/2004  DATE OF DISCHARGE:  04/14/2004                                 DISCHARGE SUMMARY   DISCHARGE DIAGNOSES:  1.  Obesity.  2.  Anxiety.  3.  Angina.  4.  Mitral regurgitation.  5.  Migraines.  6.  ____________.  7.  Coronary artery disease.   DISCHARGE MEDICATIONS:  1.  Metoprolol 25 mg daily.  2.  Neurontin 300 mg p.o. t.i.d.  3.  Paxil ____________p.o. daily.  4.  Trazodone 100 mg p.o. q.h.s.  5.  Nitroglycerin sublingual 0.4 mg every five minutes x3.  6.  Ativan 1 mg p.o. q.6h. x2 p.r.n.  7.  Prilosec 20 mg p.o. q.h.s.   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  Came  to RAD, still complaining of chest pain which radiated down his left arm.  The patient was ruled out with cardiac enzymes.  EKG was within normal  limits.  The patient has an anxiety component as well as carpal tunnel which  caused the left arm numbness.  The patient also with possible GI upset.  The  patient was discharged on Prilosec, Ativan, and nitroglycerin sublingual, as  well as a low fat, low calorie diet.  The patient with few risk factors as  the patient has no family history.  The patient is obese, does not have  diabetes, does not have hypertension, and cholesterol checks in clinic was  total of 184, HDL 36, LDL 124.  We will follow up diet as an outpatient.   CONDITION ON DISCHARGE:  Stable.   DISPOSITION:  Discharged to home.   FOLLOWUP:  Dr. Erenest Rasher at [facility].     MBV/MEDQ  D:  04/16/2004  T:  04/16/2004  Job:  [id number]

## 2010-07-05 NOTE — Op Note (Signed)
NAMECLAIRE, BRIDGE                ACCOUNT NO.:  [id number]   MEDICAL RECORD NO.:  [id number]          PATIENT TYPE:  AMB   LOCATION:  SDS                          FACILITY:  [facility]   PHYSICIAN:  Donalee Citrin, M.D.        DATE OF BIRTH:  March 18, 1970   DATE OF PROCEDURE:  03/27/2006  DATE OF DISCHARGE:  03/27/2006                               OPERATIVE REPORT   PREOPERATIVE DIAGNOSIS:  Left carpal tunnel syndrome.   POSTOPERATIVE DIAGNOSIS:  Left carpal tunnel syndrome.   PROCEDURE:  Left carpal tunnel release.   SURGEON:  Donalee Citrin, M.D.   ANESTHESIA:  General endotracheal.   HISTORY OF PRESENT ILLNESS:  The patient is a very pleasant 40 year old  gentleman, who has had longstanding left wrist and hand pain and  numbness in the first 3 fingers of his left hand.  EMG documented severe  median nerve entrapment at the wrist, and due to the patient's failure  of conservative treatment, EMG findings and clinical exam, the patient  was recommended left carpal tunnel release.  The risks and benefits of  the operation were explained to the patient.  He understands and agrees  to proceed forward.   DESCRIPTION OF PROCEDURE:  The patient was brought into the OR and was  induced under general anesthesia secondary to the patient's lack of  obtaining adequate venous access for a Bier block.  The left hand was  prepped and draped in the usual sterile fashion.  An incision was drawn  out from the distal crease of the wrist along the palmar crease on a  line extending to the second digit, approximately 3 to 4 cm long.  After  infiltration of 5 mL of lidocaine with epi, an incision was made with a  15-blade scalpel.  The subcutaneous tissue was dissected free to  identify the transverse carpal ligament and flexor retinaculum.  This  was then divided in layers with a 15-blade scalpel until the epineurium  of the median nerve was identified.  Then, using a hemostat, the  epineurium was developed  from the undersurface of the ligament.  The  ligament was then divided progressively, both proximally and distally,  using hemostats to maintain the plane between the ligament and the  epineurium.  Then, after the ligament was divided and the hemostats were  easily passed both proximally and distally along the path of the median  nerve, the wound was copiously irrigated and meticulous hemostasis was  maintained.  The incision was closed with an interrupted vertical  mattress in the skin.  At the end of the case, all needle counts were  reported as correct.           ______________________________  Donalee Citrin, M.D.     GC/MEDQ  D:  03/27/2006  T:  03/27/2006  Job:  [id number]

## 2010-07-05 NOTE — H&P (Signed)
Lance Luna, Lance Luna NO.:  [id number]   MEDICAL RECORD NO.:  [id number]          PATIENT TYPE:  INP   LOCATION:  4743                         FACILITY:  [facility]   PHYSICIAN:  Madeleine B. Vanstory, M.D.DATE OF BIRTH:  1970/07/08   DATE OF ADMISSION:  04/12/2004  DATE OF DISCHARGE:                                HISTORY & PHYSICAL   CHIEF COMPLAINT:  Chest pain.   HISTORY OF PRESENT ILLNESS:  This is a 40 year old Caucasian male who has  had a 36-hour history of atypical chest pain. It came on gradually, has been  constant, left-sided chest, radiates to his arms. No shortness of breath.  Positive diaphoresis. Positive dizziness. Not relieved with rest. No nausea,  vomiting, diarrhea, or abdominal pain. He was seen at [facility] yesterday  complaining of chest pain or shortness of breath. Normal point of care  cardiac enzymes.  Negative D-dimer. Plans for Imitrex, arrange outpatient  follow-up. The patient's pain persisted,  despite a trial of Vicodin, and  the patient returned to the ED.  The patient has no history of  hyperlipidemia, no history of hypertension or diabetes.   REVIEW OF SYSTEMS:  As per HPI, with left-handed numbness and weakness.  Positive rash.   PAST MEDICAL HISTORY:  1.  Obesity.  2.  Generalized anxiety disorder.  3.  Mental retardation.  4.  Atopic dermatitis.  5.  Migraines.  6.  History of Helicobacter pylori positive ulcer.  7.  Left carpal tunnel.   MEDICATIONS:  1.  Metoprolol 25 mg p.o. daily.  2.  Neurontin 300 mg p.o. t.i.d.  3.  Paxil-CR 25 mg two tabs p.o. daily.  4.  Trazodone 100 mg p.o. q.h.s.   ALLERGIES:  No known drug allergies.   SOCIAL HISTORY:  Is on disability for ruptured disk. No history of smoking.  No ETOH. Lives in an apartment with people with learning disabilities.   FAMILY HISTORY:  Mother with migraines. Grandmother and grandfather with  heart problems in their 21s with blockages.   PHYSICAL  EXAMINATION:  VITAL SIGNS: Temperature 97.1, heart rate 90, blood  pressure 132/83, respirations 18, saturation 95% on room air. Weight 262  pounds.  GENERAL APPEARANCE:  In no acute distress. Patient's judgment __________ but  high functioning. Alert and oriented.  HEENT: Pupils equal, round, and reactive to light. Moist mucous membranes.  Poor dentition. No lymphadenopathy.  LUNGS: Clear to auscultation bilaterally. No wheezes or rhonchi.  CV: Regular rate and rhythm. No murmurs, rubs, or gallops.  EXTREMITIES: Full range of motion. 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  GI:  Abdomen is soft, nontender, nondistended.  NEUROLOGIC: Cranial nerves II-XII intact.   LABORATORY DATA:  D-dimer less than 0.22. Sodium 137, potassium 4.0,  chloride 106, bicarbonate 26, BUN 8, creatinine 2.9, glucose 83, AST 23, ALT  65. Point of care enzymes reveal troponin-I 0.05, CK 135, CK-MB 2.3.  Fasting lipid panel drawn around December 2005 total 184, HDL 36, LDL 124.  Chest x-ray shows mild cardiac enlargement. No acute process.   ASSESSMENT/PLAN:  This  is a 40 year old male with:  1.  Chest pain, atypical in nature. It came gradually while at rest,      constant times one and a half days with shortness of breath and      radiation down his arm, but no current shortness of breath. The left      chest is tender to palpation. The patient's risk factors are LDL 124,      HDL 36. No hypertension, no diabetes. Possible family history with      blockages in his grandparents. Will admit the patient to telemetry bed      for 23-hour observation. Will check for cardiac enzymes times three,      check EKG. With D-dimer negative, no shortness of breath, and gradual      onset, therefore PE unlikely. The patient does have a history of H.      pylori PE and this does not appear to be abdominal or GERD-like pain.      Will start PPI. Because the pain is reproducible with palpation, sounds      like a  costochondritis, though could be anxiety as the patient has a      psych history of anxiety.  2.  Patient is mentally retarded, but high functioning and competent.      Therefore, agrees to admission for observation. 3.  This is also      possibly shingles as the pain is within a dermatome pattern. Will check      for blistering or pain in the a.m.      MBV/MEDQ  D:  04/12/2004  T:  04/12/2004  Job:  [id number]

## 2010-07-05 NOTE — H&P (Signed)
NAME:  Lance Luna, FONTENOT.:  [id number]   MEDICAL RECORD NO.:  [id number]          PATIENT TYPE:  EMS   LOCATION:  MAJO                         FACILITY:  [facility]   PHYSICIAN:  Meade Maw, M.D.    DATE OF BIRTH:  December 18, 1970   DATE OF ADMISSION:  05/14/2005  DATE OF DISCHARGE:                                HISTORY & PHYSICAL   PRIMARY CARE PHYSICIAN:  Dr. Lajean Manes   CHIEF COMPLAINT:  Chest pain.   HISTORY OF PRESENT ILLNESS:  Mr. Lance Luna is a 40 year old Caucasian male with  known coronary artery disease status post MI and catheterization in March of  1987. He complained of exertional burning chest pain across his entire chest  on Saturday. Associated with the chest pain was shortness of breath, nausea,  diaphoresis, and palpations. He denied vomiting, dizziness, and syncope. The  chest pain lasted 4-5 minutes and was relieved with rest. He had a  recurrence on Sunday in the right chest in which he also experienced  belching. On Monday he admitted to dyspnea on exertion when walking from his  car to his office. He also experienced some minor chest pain. Both the  dyspnea on exertion and chest pain were relieved with rest. [name] has  admitted to fatigue and weakness since his original episode of chest pain 4  days ago. Today he was resting and denies any episodes of chest pain or  shortness of breath. However he took sublingual nitroglycerin x1 and  indicates that he felt better. He decided to come to the [facility]  emergency department when he could not get an appointment with Dr. Preston,  his cardiologist. He was brought in to the emergency department by his wife  via private car. He admits to currently being chest pain free. His EKG  revealed normal sinus rhythm with a ventricular rate of 88 beats per minute  which is consistent with a previous EKG that was done in May of 2006. Also  in May of 2006 a stress Cardiolite was done that revealed minimal  ischemia  in the inferolateral region. Point of care and serial cardiac enzymes are  pending. A left heart cardiac catheterization is pending in the a.m.   PAST MEDICAL HISTORY:  1.  Coronary artery disease status post MI and catheterization in March of      19 87.  2.  Hypertension.  3.  Dyslipidemia.  4.  Diabetes mellitus.  5.  Tobacco abuse.  6.  Neck, shoulder and arm pain secondary to nerve impingement.   ALLERGIES:  No known drug allergies.   MEDICATIONS:  1.  Enteric coated aspirin 325 mg daily.  2.  Glucophage 500 mg two tablets b.i.d.  3.  Glucovance 10 mg daily.  4.  Lipitor 20 mg daily.   PAST SURGICAL HISTORY:  None.   FAMILY HISTORY:  Significant for cardiovascular and cancer.   SOCIAL HISTORY:  He is married and lives with his wife. 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 Mr. Lance Luna has a 40 pack year  smoking history, however  now admits to smoking 3 packs over a 34-month  period. He also admits to quitting for a period of 3 years. He denies  alcohol or illicit drug use.   REVIEW OF SYSTEMS:  All systems reviewed are negative other than what is  stated in the HPI.   PHYSICAL EXAMINATION:  GENERAL:  40 year old male, pleasant and cooperative,  NAD.  VITAL SIGNS:  Temperature 97.7, pulse 93, respirations 20, blood pressure  140/83, O2 saturations 100% on room air.  HEENT:  Unremarkable.  NECK:  Supple without JVD or carotid bruits bilaterally.  LUNGS:  Breath sounds are equal and clear to auscultation bilaterally  without wheezes, rhonchi, crackles or rales.  HEART:  Regular rate and rhythm. S1 and S2 normal without murmurs, gallops,  click or rub.  ABDOMEN:  Soft, nontender, nondistended with active bowel sounds. No aortic  or iliac bruits.  EXTREMITIES:  No peripheral edema. DP pulses 2+/2 bilaterally.  SKIN:  Warm and dry without rashes or lesions.  NEURO:  Alert and oriented x3. No focal deficits.  PSYCHE:  Normal mood and affect.    LABORATORY DATA:  C-MET, CBC, PT, INR, PTT, point of care cardiac enzymes  and serial cardiac enzymes are pending.  Portable chest x-ray results are pending.   EKG - normal sinus rhythm with a ventricular rate of 88 beats per minute  consistent with previous EKG in May of 2006.   ASSESSMENT:  1.  Chest pain.  2.  Dyspnea.  3.  Fatigue.  4.  Weakness.  5.  Coronary artery disease status post myocardial infarction, status post      catheterization in March, 1987.  6.  Hypertension, controlled.  7.  Hyperlipidemia.  8.  Diabetes mellitus.  9.  Tobacco abuse.   PLAN:  1.  Rule out MI. Serial cardiac enzymes are pending. Left heart      catheterization is scheduled for the a.m. The patient is currently pain      free. EKG is normal sinus rhythm with ventricular rate of 88 beats per      minute.  2.  Admit to cardiac telemetry unit under the care of Dr. Meade Maw for      a diagnosis of chest pain.  3.  Sublingual nitroglycerin 0.4 mg p.r.n. chest pain x3.  4.  Pre-cardiac catheterization orders have been written.  5.  Start subcutaneous Lovenox 1 mg/kg q.12 hours. Check CBC every a.m.      while on Lovenox.  6.  Electrocardiogram in the a.m. and with recurrent chest pain.  7.  Check B-MET, CBC, FLP, and magnesium in the a.m.  8.  Initiate cardiac p.r.n. orders.  9.  The patient was seen and examined by Dr. Fraser Din who participated in the      medical decision making and plan of care.      [address] [place]      Meade Maw, M.D.  Electronically Signed    RDM/MEDQ  D:  05/14/2005  T:  05/15/2005  Job:  [id number]   cc:   Oley Balm. Georgina Pillion, M.D.  Fax: [phone]

## 2010-07-05 NOTE — Discharge Summary (Signed)
NAME:  HAKOP, HUMBARGER                          ACCOUNT NO.:  [id number]   MEDICAL RECORD NO.:  [id number]                   PATIENT TYPE:  INP   LOCATION:  3011                                 FACILITY:  [facility]   PHYSICIAN:  Donalee Citrin, M.D.                     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   PROCEDURES PERFORMED:  Decompression laminectomy and posterior lumbar inner  body fusion L3-4 and L4-5.   HISTORY:  The patient is a pleasant 40 year old gentleman who is with  longstanding back and bilateral leg pain refractory to conservative  treatment. The patient was admitted as an EMA and underwent the  aforementioned procedure. Postoperatively the patient did very well. Went to  the recovery.  On the floor the patient was afebrile. Over the first 24  hours was progressively mobilized with physical and occupational therapy.  Hemovac was able to be taken out on day two. His Foley was also taken out.  He was voiding spontaneously. He complained of a mild headache, however,  this progressively deteriorated. The patient continued to mobilize and  progress well with physical and occupational therapy and by hospital day  five the patient was afebrile, tolerating a regular diet, ambulating and  voiding spontaneously. He was discharge home with pain medicine, muscle  relaxors, a brace, and a rolling walker.                                                Donalee Citrin, M.D.    GC/MEDQ  D:  03/03/2003  T:  03/03/2003  Job:  [id number]

## 2010-07-05 NOTE — Op Note (Signed)
NAME:  ZAKYE, Lance Luna                          ACCOUNT NO.:  [id number]   MEDICAL RECORD NO.:  [id number]                   PATIENT TYPE:  OIB   LOCATION:  3172                                 FACILITY:  [facility]   PHYSICIAN:  Donalee Citrin, M.D.                     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   PROCEDURES:  Decompressive lumbar decompressive laminectomy at L3-4 and L4-5  with posterior lumbar interbody fusion at L3-4 and L4-5 using 12 x 26 mm  Tangent Allograft at L3-4 and a 10 x 26 mm Tangent Allograft at L4-5,  pedicle screw fixation from L3 to L5 using the M10 Legacy pedicle screw  system with 6.5 x 40 pedicle screws inserted at all six pedicles,  posterolateral fusion with locally harvested Autograft at L3-4 and L4-5,  open reduction of spinal deformity at L4-5.   SURGEON:  Donalee Citrin, M.D.   ASSISTANT:  Kathaleen Maser. Pool, M.D.   ANESTHESIA:  General endotracheal anesthesia.   HISTORY OF PRESENT ILLNESS:  The patient is a very pleasant 40 year old  gentleman who has had long-standing back and leg pain predominantly on the  left radiating down to his knee and to the front of his shin.  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  After  failing conservative treatment, the patient was recommended a radical  decompressive laminectomy and fusion. I discussed all the risks of the  surgery with him and he understands and agrees to proceed.   DESCRIPTION OF PROCEDURE:  The patient was brought to the OR and was induced  under general endotracheal anesthesia.  He was placed on the Wilson frame  and his back was prepped and draped in the usual sterile fashion.  An  intraoperative x-ray confirmed the proper position of the L4 pedicle.  After  infiltration with 2 mL of lidocaine, a midline incision was made and Bovie  electrocautery was used to dissect down to the subcutaneous tissues.  Subperiosteal dissection was carried out to the lamina of L3, 4 and 5  bilaterally exposing the TP's  of L3, 4 and 5.  Intraoperative x-ray  confirmed the localization of the L4 pedicle.  Then after the TP's and after  everything had been exposed a self retaining retractor was placed. A Leksell  was used to distract on the L3 and L4 spinous processes and noted to be  hypermobile. In addition, the facet complexes at L3-4 and L4-5 were noted to  be severely degenerated.  The spinous processes at L3 and 4 were removed.  Then using a Leksell rongeur and a 4 mm Kerrison punch, the decompressive  laminectomy was completed as well as radical medial facetectomy.  There was  a very large calcified synovial cyst coming off the medial facet complex on  the right at L3-4.  This was causing marked spinal stenosis of the thecal  sac at this level as well as compression of both the L3 and L4 nerve roots  on the right side.  Using a 4 Penfield this calcified synovial cyst was  dissected away from the thecal sac and removed in a piecemeal fashion with a  Leksell rongeur and Kerrison punches as well as the entire facet complex and  medial aspect at that level.  The facet complex at 3-4 on the left had   already been removed and three of the four roots had been decompressed at  that level.  Then turning attention to the L4-5, the L4-5 facet complex was  also noted to be markedly degenerated and the L4 nerve root was noted to be  fairly stenotic due to the anterolisthesis of L4 on 5 with pseudodisc  compression of both the L4 and L5 nerve roots.  The medial facet complexes  were radically decompressed and removed and at the end of the decompression  the L3, 4 and 5 nerve roots on both sides were radically decompressed and  explored and noted to have no further stenosis.  Then attention was taken to  the interbody work.  Using a retractor, the right L4 nerve root was  reflected medially and the interspace was radically cleaned out after the  epidural veins were coagulated.  Using a size 12 mm distractor this was  inserted, confirmed with fluoroscopy, good apposition of the end plates and  then on the left side the disk space was radically cleaned out again using  pituitary rongeurs, downgoing Epstein curettes, a size 12 cutter and chisel.  A 10 x 26 mm Tangent Allograft was inserted on the left side approximately 1  to 2 cm deep to the posterior vertebral and alignment confirmed with  fluoroscopy, depth and projection each step along the way.  Then on the  right side using a size 12 cutter and chisel the disk space and end plates  were prepared to receive the bone graft.  Downgoing Epstein curette was used  to scrape the central end plates and the locally harvested Allograft was  packed against the Allograft on the left side.  On the right side Allograft  was inserted in a similar fashion.  Both Allografts were noted to be in good  position with fluoroscopy as well as direct inspection.  Gelfoam was placed  at this level and attention turned to L4-5.  There was noted to be a large bulbous pseudodisc formation predominantly on the right side at L4-5.  The  right L5 nerve root was reflected  medially and using the retractor, an  annulotomy was made.  The disk space was then radically cleaned out and a  size 10  distractor was inserted on this side.  Fluoroscopy confirmed good  apposition of the end plates with the 10 distractor.  It was decided to use  the 10 instrument and 10 Allograft at this level.  Then the left L5 nerve  root was reflected medially and the disk space was radically cleaned out.  Using a size 10 cutter and chisel, the end plates were prepared to receive  the bone graft.  Several large fragments of disk were removed from the  central end plate and noted to be compressed at both the L4 and L5 nerve  roots. After the end plates were prepared to receive the bone graft, 10 x 26  Tangent autograft was inserted on the left side at L4-5.  The 12 had been  inserted at L3-4 approximately 1 to 2 cm deep to the posterior vertebral  line.  All bone grafts were confirmed by x-ray with good position and  trajectory.  Then on the right side at L4-5, we reflected the right L5 nerve  root medially.  The disk space was cleaned out with a size 10 cutter and  chisel.  The ongoing Epstein curette was used to scrape the end plates off.  Locally harvested autograft was packed against the Allograft on the left  side.  It was also noted that with the size 10 distractor that this  malalignment at L4-5 was significantly corrected and reduced to about half  its original lithiasis.  Then the 10 x 26 mm Tangent __________ on the right  side and both Allografts noted to be in good position by fluoroscopy.  Then  the __________ were placed in the pilot holes, drilled the L3 pedicle on the  left side, cannulated with the awl, probed and tapped with a 5.5 tap, probed  again and then a 6.5 x 40 pedicle screw inserted at L3 on the left.  The  pedicles were noted to be competent in position and orientation and direct  interpedicular inspection confirmed no medial breech.  The L4 and L5 pedicle   screws were noted to be placed in a similar fashion on the left side.  The  L5 pedicle screw with the tap was noted to be slightly medial so a new entry  point was picked slightly lateral and superior to this.  It was  recannulated, re-tapped, probed, re-awled and the new pedicle screw was  noted to be competent with no medial breech of the pedicle.  The L3, 4 and 5  pedicle screws on the right side were inserted in a similar fashion with  fluoroscopy confirming good depth and trajectory. All pedicles were noted to  be competent at three screw orientation and no medial breech.  After all six  pedicles had been placed the wound was copiously irrigated.  Meticulous  hemostasis was obtained.  The lateral TP's and gutters were grossly  decorticated.  The remainder of the locally harvested Autograft was packed  in the lateral gutters and along the TP's on both sides.  Then a 7 mm rod was sized, selected and inserted and the pedicle screws tightened down at L5  and torqued for final tightening.  The L4 pedicle screws were compressed  against L5 and the L3 compressed against L4 bilaterally.  Then a 422 cross  length was applied.  Postoperative fluoroscopy confirmed good position of  the screws and bone graft.  Then Gelfoam was overlaid on top of the dura.  The muscle and fascia were reapproximated with 0  interrupted Vicryl.  The subcutaneous tissue was closed with 2-0 interrupted  Vicryl and the skin was closed with running 4-0 subcuticular.  A medium  Hemovac drain had been placed prior to closure.  The patient went to the  recovery room in stable condition.  At the end of the case, needle and  instrument counts were correct.                                               Donalee Citrin, M.D.    GC/MEDQ  D:  01/16/2003  T:  01/16/2003  Job:  [id number]

## 2010-07-17 ENCOUNTER — Ambulatory Visit (INDEPENDENT_AMBULATORY_CARE_PROVIDER_SITE_OTHER): Payer: 59 | Admitting: Psychiatry

## 2010-07-17 DIAGNOSIS — F209 Schizophrenia, unspecified: Secondary | ICD-10-CM

## 2010-07-24 ENCOUNTER — Telehealth: Payer: Self-pay | Admitting: Family Medicine

## 2010-07-24 ENCOUNTER — Ambulatory Visit (INDEPENDENT_AMBULATORY_CARE_PROVIDER_SITE_OTHER): Payer: 59 | Admitting: Psychiatry

## 2010-07-24 DIAGNOSIS — F209 Schizophrenia, unspecified: Secondary | ICD-10-CM

## 2010-07-24 NOTE — Telephone Encounter (Signed)
Patient felt a popping sensation in neck while [place] the [place].  I asked if it was in his neck or ears and he said neck.  Ever since that popping, his neck has been aching and also c/o aching in his arms aching along with numbness and tingling in both hands and fingers.  According to patient he has a "plate" in his neck that was placed years ago for a bad disc.  Told pt he needed to be seen.  He cannot come in until Friday.  Dr. Jeri Lager schedule was full so I placed him with Dr. Wallene Huh at 9:30.

## 2010-07-24 NOTE — Telephone Encounter (Signed)
Went to mountains the other day and felt a popping sensation to neck.  Pt states his legs have been aching ever since.

## 2010-07-26 ENCOUNTER — Ambulatory Visit (INDEPENDENT_AMBULATORY_CARE_PROVIDER_SITE_OTHER): Payer: 59 | Admitting: Family Medicine

## 2010-07-26 ENCOUNTER — Encounter: Payer: Self-pay | Admitting: Family Medicine

## 2010-07-26 VITALS — BP 133/83 | HR 82 | Temp 97.6°F | Wt 340.0 lb

## 2010-07-26 DIAGNOSIS — M542 Cervicalgia: Secondary | ICD-10-CM | POA: Insufficient documentation

## 2010-07-26 DIAGNOSIS — R0789 Other chest pain: Secondary | ICD-10-CM

## 2010-07-26 MED ORDER — CYCLOBENZAPRINE HCL 5 MG PO TABS: 10.0000 mg | ORAL_TABLET | Freq: Three times a day (TID) | ORAL | Status: DC | PRN

## 2010-07-26 NOTE — Patient Instructions (Addendum)
Go to get you neck Xray today. Go to [facility] imaging to get his done and let them know that the order is in the computer system  If you notice difficulty breathing, problems with bowel or bladder, weakness in arms or legs or any other concerns give [place] a call. I have refilled you muscle relaxer medication (Flexeril) - try this to see if it helps.  You should take ibuprofen as needed for pain as well.

## 2010-07-27 NOTE — Assessment & Plan Note (Signed)
Will obtain plain films to assess hardware, disc spaces etc. No red flags on exam or history. Follow up with PCP vs neurosurgery based on results of films, continued level of symptomatology. Ibuprofen for pain, Flexeril as before. Continue gabapentin as well.

## 2010-07-27 NOTE — Progress Notes (Signed)
  Subjective:    Patient ID: Lance Luna, male    DOB: Sep 19, 1970, 40 y.o.   MRN: [id number]  HPI  1) Neck pain: Patient reports neck pain which started 5 days ago - he has had similar neck pain in the past. 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 Reports chronic intermittent numbness in bilateral hands (no specific pattern - numbness is "all over hands") usually occurs at bedtime. Has not taken anything for pain. Denies acute loss of ROM of neck, fever, chills, nausea, emesis, trismus, weakness, worsening in baseline intermittent numbness in hands, breathing difficulties, change in ambulation, constipation, bladder incontinence. Neurosurgeon is Dr. Wynetta Emery - he has not followed up with Dr. Wynetta Emery in several years.   Pertinent past history reviewed.  Review of Systems As per HPI     Objective:   Physical Exam General: obese, NAD  Neck: full ROM with rotation bilaterally without pain. Near full ROM with flexion (at baseline per patient report), decreased ROM with extension (but again at baseline per patient report) all without pain. + Spurling (but not discernable pattern to numbness - just "all over hands" - resolves almost immediately with cessation of test.  Upper extremities: sensation intact to light touch, pinprick throughout; good strength with grip bilaterally, good strength with pincer grasp bilaterally, good strength wrist flexion / extension, elbow flexion / extension, finger abduction bilaterally. 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        Assessment & Plan:

## 2010-07-31 ENCOUNTER — Ambulatory Visit (INDEPENDENT_AMBULATORY_CARE_PROVIDER_SITE_OTHER): Payer: 59 | Admitting: Psychiatry

## 2010-07-31 DIAGNOSIS — F209 Schizophrenia, unspecified: Secondary | ICD-10-CM

## 2010-08-02 ENCOUNTER — Ambulatory Visit
Admission: RE | Admit: 2010-08-02 | Discharge: 2010-08-02 | Disposition: A | Discharge status: Home / Self Care | Payer: 59 | Source: Ambulatory Visit | Attending: Family Medicine | Admitting: Family Medicine

## 2010-08-02 DIAGNOSIS — M542 Cervicalgia: Secondary | ICD-10-CM

## 2010-08-07 ENCOUNTER — Ambulatory Visit (INDEPENDENT_AMBULATORY_CARE_PROVIDER_SITE_OTHER): Payer: 59 | Admitting: Psychiatry

## 2010-08-07 DIAGNOSIS — F209 Schizophrenia, unspecified: Secondary | ICD-10-CM

## 2010-08-15 ENCOUNTER — Ambulatory Visit: Payer: 59 | Admitting: Family Medicine

## 2010-08-28 ENCOUNTER — Ambulatory Visit (INDEPENDENT_AMBULATORY_CARE_PROVIDER_SITE_OTHER): Payer: Medicare Other | Admitting: Psychiatry

## 2010-08-28 DIAGNOSIS — F209 Schizophrenia, unspecified: Secondary | ICD-10-CM

## 2010-08-29 ENCOUNTER — Other Ambulatory Visit: Payer: Self-pay | Admitting: Family Medicine

## 2010-08-29 ENCOUNTER — Ambulatory Visit: Payer: 59 | Admitting: Family Medicine

## 2010-08-29 MED ORDER — LORATADINE 10 MG PO TABS: 10.0000 mg | ORAL_TABLET | Freq: Every day | ORAL | Status: DC

## 2010-09-04 ENCOUNTER — Encounter: Payer: Self-pay | Admitting: Family Medicine

## 2010-09-04 ENCOUNTER — Ambulatory Visit (INDEPENDENT_AMBULATORY_CARE_PROVIDER_SITE_OTHER): Payer: 59 | Admitting: Psychiatry

## 2010-09-04 ENCOUNTER — Ambulatory Visit (INDEPENDENT_AMBULATORY_CARE_PROVIDER_SITE_OTHER): Payer: Medicare Other | Admitting: Family Medicine

## 2010-09-04 VITALS — BP 145/84 | HR 97 | Temp 97.6°F | Wt 335.0 lb

## 2010-09-04 DIAGNOSIS — R229 Localized swelling, mass and lump, unspecified: Secondary | ICD-10-CM

## 2010-09-04 DIAGNOSIS — F209 Schizophrenia, unspecified: Secondary | ICD-10-CM

## 2010-09-04 DIAGNOSIS — R2230 Localized swelling, mass and lump, unspecified upper limb: Secondary | ICD-10-CM | POA: Insufficient documentation

## 2010-09-04 NOTE — Assessment & Plan Note (Signed)
Bilateral, small, no red flags, not growing.  ? Beginning of hyradenitits vs folliculitis vs lymph nodes.  Will have pt watch and let us know if changes.  Gave red flags for RTC.  Will see back in 3 months and recheck if pt does not present before that

## 2010-09-04 NOTE — Patient Instructions (Signed)
Everything looked good on your xrays from before  For the lumps: try not to bother them much, but keep an eye on them. IF they are growing or draining or getting more tender, call us and come in IF you have night sweats or weight loss (when you aren't watching your diet) let us know   Come back and see me in 3-4 months and I will check on them

## 2010-09-04 NOTE — Progress Notes (Signed)
  Subjective:    Patient ID: Lance Luna, male    DOB: March 14, 1970, 40 y.o.   MRN: [id number]  HPI Pt presents for eval of lumps under B arms.  He found them 1 week ago when he was applying deodorant.  They are TTP but do not hurt when not touching them.  No drainage or redness.  He is worried that these might be something bad.  He denies HA, night sweats, fevers, weight loss or fatigue.  He has been well except for some psoriasis that he sees a dermatologist for.      Review of Systems    Denies CP, SOB, HA, N/V/D, fever  Objective:   Physical Exam Vital signs reviewed General appearance - alert, well appearing, and in no distress and oriented to person, place, and time Heart - normal rate, regular rhythm, normal S1, S2, no murmurs, rubs, clicks or gallops Chest - clear to auscultation, no wheezes, rales or rhonchi, symmetric air entry, no tachypnea, retractions or cyanosis Abdomen - soft, nontender, nondistended, no masses or organomegaly Skin- acne lesions on back and arms, few scattered seb K, several skin tags under arms, on right there are 2 1cm lesions just under the skin that are TTP, no redness, no drainage, not connected to a hair follicle on left there is one smaller 5mm lesion with similar characteristics.        Assessment & Plan:

## 2010-09-11 ENCOUNTER — Ambulatory Visit (INDEPENDENT_AMBULATORY_CARE_PROVIDER_SITE_OTHER): Payer: 59 | Admitting: Psychiatry

## 2010-09-11 DIAGNOSIS — F209 Schizophrenia, unspecified: Secondary | ICD-10-CM

## 2010-09-18 ENCOUNTER — Encounter: Payer: Self-pay | Admitting: Family Medicine

## 2010-09-18 ENCOUNTER — Ambulatory Visit (INDEPENDENT_AMBULATORY_CARE_PROVIDER_SITE_OTHER): Payer: 59 | Admitting: Psychiatry

## 2010-09-18 ENCOUNTER — Ambulatory Visit (INDEPENDENT_AMBULATORY_CARE_PROVIDER_SITE_OTHER): Payer: Medicare Other | Admitting: Family Medicine

## 2010-09-18 DIAGNOSIS — L0390 Cellulitis, unspecified: Secondary | ICD-10-CM

## 2010-09-18 DIAGNOSIS — R229 Localized swelling, mass and lump, unspecified: Secondary | ICD-10-CM

## 2010-09-18 DIAGNOSIS — F209 Schizophrenia, unspecified: Secondary | ICD-10-CM

## 2010-09-18 DIAGNOSIS — R0789 Other chest pain: Secondary | ICD-10-CM

## 2010-09-18 DIAGNOSIS — L0291 Cutaneous abscess, unspecified: Secondary | ICD-10-CM

## 2010-09-18 DIAGNOSIS — R2230 Localized swelling, mass and lump, unspecified upper limb: Secondary | ICD-10-CM

## 2010-09-18 DIAGNOSIS — I10 Essential (primary) hypertension: Secondary | ICD-10-CM

## 2010-09-18 MED ORDER — MUPIROCIN 2 % EX OINT: TOPICAL_OINTMENT | CUTANEOUS | Status: DC

## 2010-09-18 MED ORDER — CYCLOBENZAPRINE HCL 5 MG PO TABS: 10.0000 mg | ORAL_TABLET | Freq: Three times a day (TID) | ORAL | Status: DC | PRN

## 2010-09-18 NOTE — Assessment & Plan Note (Signed)
Small abscess on leg that self drained.  Still some surrounding erythema.  Gave bactroban, rec warm compresses, see back on Friday for recheck if worsening.  May give abx at that time or I and D.  Nothing to I and D today

## 2010-09-18 NOTE — Assessment & Plan Note (Signed)
Improved.  Will follow at next visit.

## 2010-09-18 NOTE — Progress Notes (Signed)
  Subjective:    Patient ID: Lance Luna, male    DOB: 1970/02/26, 40 y.o.   MRN: [id number]  HPI  Pt presents for evaluation of abscess on leg and f/u of HTN, lymph node enlargement  abcess- x3 days, no fevers, a small amount of pus was expressed.  Getting better but he is picking at it.  No itch.  Started as a pimple.    HTN- does not take at home.  Taking meds as rx.  No HA, CP, SOB.  Starting to walk more with no pain.  Walking daily for 10 min.  Lymph nodes-  Denies fevers, night sweats, pain in underarms or any discharge from lesions.    Review of Systems Denies CP, SOB, HA, N/V/D, fever     Objective:   Physical Exam  Vital signs reviewed General appearance - alert, well appearing, and in no distress and oriented to person, place, and time Heart - normal rate, regular rhythm, normal S1, S2, no murmurs, rubs, clicks or gallops Chest - clear to auscultation, no wheezes, rales or rhonchi, symmetric air entry, no tachypnea, retractions or cyanosis SKin- lymph nodes under arms improved, swelling down to 0.5 cm on right in two places and smaller in left in one place.  No redness or heat Left leg below knee with pustular lesion, scabbed, with surrounding erythema of <4cm.  No edema or heat, no confluence.      Assessment & Plan:

## 2010-09-18 NOTE — Patient Instructions (Signed)
Make an appt for Friday PM.  I have one appt spot left  Use warm compresses and bactroban on it  Call and cancel if it is better  If it is worse we will start abx  If you get fevers or you are worried, call us sooner

## 2010-09-18 NOTE — Assessment & Plan Note (Signed)
BP Readings from Last 3 Encounters:  09/18/10 147/81  09/04/10 145/84  07/26/10 133/83   BP unchanged.  Encouraged to continue activity, check Bp at home.  Will increase meds if remains consistently above 140/90

## 2010-09-24 ENCOUNTER — Encounter: Payer: Self-pay | Admitting: Family Medicine

## 2010-09-24 ENCOUNTER — Ambulatory Visit (INDEPENDENT_AMBULATORY_CARE_PROVIDER_SITE_OTHER): Payer: 59 | Admitting: Family Medicine

## 2010-09-24 VITALS — BP 133/83 | HR 73 | Temp 97.9°F | Wt 335.0 lb

## 2010-09-24 DIAGNOSIS — L0390 Cellulitis, unspecified: Secondary | ICD-10-CM

## 2010-09-24 DIAGNOSIS — L0291 Cutaneous abscess, unspecified: Secondary | ICD-10-CM

## 2010-09-24 MED ORDER — DOXYCYCLINE HYCLATE 100 MG PO TABS: 100.0000 mg | ORAL_TABLET | Freq: Two times a day (BID) | ORAL | Status: AC

## 2010-09-24 NOTE — Progress Notes (Signed)
  Subjective:    Patient ID: Lance Luna, male    DOB: Jan 06, 1971, 40 y.o.   MRN: [id number]  HPI F/u of skin abcess.  Now draining adn hot.  No fevers feels ok.  Has been putting bacitracin and peroxide on it.     Review of Systems Denies CP, SOB, HA, N/V/D, fever      Objective:   Physical Exam  Vital signs reviewed General appearance - alert, well appearing, and in no distress and oriented to person, place, and time Skin- left leg with abcess with 1 cm area of flucuance and drainage and surrounding erythema.    Informed consent obtain and time out done. Area cleaned with betadine.   5 cc of lidocaine with epi injected.  11 blade used to make incision and pus drained.  Culture sent.  Bacitracin applied and bandage applied.      Assessment & Plan:

## 2010-09-24 NOTE — Patient Instructions (Signed)
I sent an antibiotic to your pharmacy Take that twice a day  Come back and see me on Friday to recheck

## 2010-09-24 NOTE — Assessment & Plan Note (Signed)
I and D today.  See back on Friday.  Doxycycline today.

## 2010-09-24 NOTE — Progress Notes (Signed)
Addended by: Reginold Agent on: 09/24/2010 03:37 PM   Modules accepted: Orders

## 2010-09-25 ENCOUNTER — Ambulatory Visit (INDEPENDENT_AMBULATORY_CARE_PROVIDER_SITE_OTHER): Payer: 59 | Admitting: Psychiatry

## 2010-09-25 DIAGNOSIS — F209 Schizophrenia, unspecified: Secondary | ICD-10-CM

## 2010-09-27 ENCOUNTER — Encounter: Payer: Self-pay | Admitting: Family Medicine

## 2010-09-27 ENCOUNTER — Ambulatory Visit (INDEPENDENT_AMBULATORY_CARE_PROVIDER_SITE_OTHER): Payer: Medicare Other | Admitting: Family Medicine

## 2010-09-27 DIAGNOSIS — L0390 Cellulitis, unspecified: Secondary | ICD-10-CM

## 2010-09-27 DIAGNOSIS — L0291 Cutaneous abscess, unspecified: Secondary | ICD-10-CM

## 2010-09-27 LAB — WOUND CULTURE: Gram Stain: NONE SEEN

## 2010-09-27 NOTE — Progress Notes (Signed)
  Subjective:    Patient ID: Lance Luna, male    DOB: [birth year], 41 y.o.   MRN: [id number]  HPI  F/u of leg abcess.  About the same, not much improvement.  Started abx on Wednesday night.  Has been taking it.  No fevers, only little drainage.    Review of Systems Denies CP, SOB, HA, N/V/D, fever     Objective:   Physical Exam Vital signs reviewed General appearance - alert, well appearing, and in no distress and oriented to person, place, and time Skin- left leg with area of redness and induration 2 in diameter.  Small draining incision.  2 small pustules below the incision.         Assessment & Plan:

## 2010-09-27 NOTE — Assessment & Plan Note (Signed)
About the same.  Only 1 day of abx so far.  Continue current tx, warm compresses, abx and see back by Wednesday.

## 2010-10-01 ENCOUNTER — Encounter: Payer: Self-pay | Admitting: Family Medicine

## 2010-10-01 ENCOUNTER — Ambulatory Visit (INDEPENDENT_AMBULATORY_CARE_PROVIDER_SITE_OTHER): Payer: Medicare Other | Admitting: Family Medicine

## 2010-10-01 DIAGNOSIS — L0291 Cutaneous abscess, unspecified: Secondary | ICD-10-CM

## 2010-10-01 DIAGNOSIS — L0390 Cellulitis, unspecified: Secondary | ICD-10-CM

## 2010-10-01 NOTE — Assessment & Plan Note (Signed)
Improved. Continue abx.  No further f/u unless worse

## 2010-10-01 NOTE — Progress Notes (Signed)
  Subjective:    Patient ID: Lance Luna, male    DOB: 10-Jun-1970, 40 y.o.   MRN: [id number]  HPI  Pt with improving skin lesion.  Still with some mild erythema.  Washing with soap and water.  Taking abx as written.  No issues.    Review of Systems    Denies CP, SOB, HA, N/V/D, fever  Objective:   Physical Exam Skin- lesion with improved redness, mild induration, small open lesion without drainage.       Assessment & Plan:

## 2010-10-02 ENCOUNTER — Ambulatory Visit (INDEPENDENT_AMBULATORY_CARE_PROVIDER_SITE_OTHER): Payer: 59 | Admitting: Psychiatry

## 2010-10-02 DIAGNOSIS — F209 Schizophrenia, unspecified: Secondary | ICD-10-CM

## 2010-10-09 ENCOUNTER — Ambulatory Visit (INDEPENDENT_AMBULATORY_CARE_PROVIDER_SITE_OTHER): Payer: 59 | Admitting: Psychiatry

## 2010-10-09 DIAGNOSIS — F209 Schizophrenia, unspecified: Secondary | ICD-10-CM

## 2010-10-16 ENCOUNTER — Ambulatory Visit (INDEPENDENT_AMBULATORY_CARE_PROVIDER_SITE_OTHER): Payer: 59 | Admitting: Psychiatry

## 2010-10-16 DIAGNOSIS — F209 Schizophrenia, unspecified: Secondary | ICD-10-CM

## 2010-10-23 ENCOUNTER — Ambulatory Visit (INDEPENDENT_AMBULATORY_CARE_PROVIDER_SITE_OTHER): Payer: 59 | Admitting: Psychiatry

## 2010-10-23 DIAGNOSIS — F209 Schizophrenia, unspecified: Secondary | ICD-10-CM

## 2010-10-30 ENCOUNTER — Ambulatory Visit (INDEPENDENT_AMBULATORY_CARE_PROVIDER_SITE_OTHER): Payer: Medicare Other | Admitting: Psychiatry

## 2010-10-30 DIAGNOSIS — F331 Major depressive disorder, recurrent, moderate: Secondary | ICD-10-CM

## 2010-10-30 DIAGNOSIS — F209 Schizophrenia, unspecified: Secondary | ICD-10-CM

## 2010-11-01 ENCOUNTER — Ambulatory Visit (INDEPENDENT_AMBULATORY_CARE_PROVIDER_SITE_OTHER): Payer: Medicare Other | Admitting: Family Medicine

## 2010-11-01 ENCOUNTER — Encounter: Payer: Self-pay | Admitting: Family Medicine

## 2010-11-01 ENCOUNTER — Other Ambulatory Visit: Payer: Self-pay | Admitting: *Deleted

## 2010-11-01 VITALS — BP 139/89 | HR 92 | Wt 344.0 lb

## 2010-11-01 DIAGNOSIS — R350 Frequency of micturition: Secondary | ICD-10-CM | POA: Insufficient documentation

## 2010-11-01 DIAGNOSIS — G43909 Migraine, unspecified, not intractable, without status migrainosus: Secondary | ICD-10-CM

## 2010-11-01 DIAGNOSIS — R300 Dysuria: Secondary | ICD-10-CM

## 2010-11-01 DIAGNOSIS — Z79899 Other long term (current) drug therapy: Secondary | ICD-10-CM

## 2010-11-01 DIAGNOSIS — L408 Other psoriasis: Secondary | ICD-10-CM

## 2010-11-01 DIAGNOSIS — R7401 Elevation of levels of liver transaminase levels: Secondary | ICD-10-CM

## 2010-11-01 DIAGNOSIS — I10 Essential (primary) hypertension: Secondary | ICD-10-CM

## 2010-11-01 DIAGNOSIS — E669 Obesity, unspecified: Secondary | ICD-10-CM

## 2010-11-01 LAB — COMPREHENSIVE METABOLIC PANEL
ALT: 52 U/L (ref 0–53)
CO2: 29 mEq/L (ref 19–32)
Calcium: 10.1 mg/dL (ref 8.4–10.5)
Chloride: 100 mEq/L (ref 96–112)
Sodium: 139 mEq/L (ref 135–145)
Total Bilirubin: 0.5 mg/dL (ref 0.3–1.2)
Total Protein: 7.7 g/dL (ref 6.0–8.3)

## 2010-11-01 LAB — POCT GLYCOSYLATED HEMOGLOBIN (HGB A1C): Hemoglobin A1C: 5.3

## 2010-11-01 MED ORDER — LORATADINE 10 MG PO TABS: 10.0000 mg | ORAL_TABLET | Freq: Every day | ORAL | Status: DC

## 2010-11-01 NOTE — Assessment & Plan Note (Signed)
Having a lot of nighttime urination. He is going to see urologist.  Will check A1C today.

## 2010-11-01 NOTE — Progress Notes (Signed)
  Subjective:    Patient ID: Lance Luna, male    DOB: 1970-04-03, 40 y.o.   MRN: [id number]  HPI  HA-  Has the same HA that he usually gets with neck pain shooting up to his occiput. On Sunday when it started he had nausea.  He is taking flexiril for this.  This is usually treated by Dr. Murray Hodgkins but his insurance will not pay for those visits.  Dr. Murray Hodgkins was doing steroid injections.  No vision changes, no weakness no sensory changes.  HTN- pt concerned about BP because will have to get teeth pulled at dentist and BP usually up when he sees them.  Taking meds.  No CP, SOB.    Dysuria/urinary frequency- pt with freuent nighttime urination x several months.  Also increased thirst.  Last glucose was 120 but previously under 100.  no pain with urination no discharge.  Review of Systems See above    Objective:   Physical Exam  Vital signs reviewed General appearance - alert, well appearing, and in no distress and oriented to person, place, and time Heart - normal rate, regular rhythm, normal S1, S2, no murmurs, rubs, clicks or gallops Chest - clear to auscultation, no wheezes, rales or rhonchi, symmetric air entry, no tachypnea, retractions or cyanosis Neck- stiffness and ROM consistent with previous exams, no change       Assessment & Plan:  Increased urinary frequency Having a lot of nighttime urination. He is going to see urologist.  Will check A1C today.  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  Will have pt take flexiril TId for 3 days and use heating pad.  No red flags.  [facility]

## 2010-11-01 NOTE — Assessment & Plan Note (Signed)
Recurrent.  Will have pt take flexiril TId for 3 days and use heating pad.  No red flags.  [facility].

## 2010-11-01 NOTE — Assessment & Plan Note (Signed)
BP normal today but usually elevated at dentist due to ?nerves.  Will send letter to dentist to release for tooth extraction.

## 2010-11-02 LAB — GC/CHLAMYDIA PROBE AMP, URINE: GC Probe Amp, Urine: NEGATIVE

## 2010-11-05 ENCOUNTER — Telehealth: Payer: Self-pay | Admitting: Family Medicine

## 2010-11-05 NOTE — Telephone Encounter (Signed)
Mr. Lance Luna is asking for lab resuts and a hard copy of the same

## 2010-11-06 ENCOUNTER — Ambulatory Visit (INDEPENDENT_AMBULATORY_CARE_PROVIDER_SITE_OTHER): Payer: 59 | Admitting: Psychiatry

## 2010-11-06 DIAGNOSIS — F209 Schizophrenia, unspecified: Secondary | ICD-10-CM

## 2010-11-06 LAB — I-STAT 8, (EC8 V) (CONVERTED LAB)
Acid-Base Excess: 6 — ABNORMAL HIGH
Chloride: 106
Glucose, Bld: 97
Hemoglobin: 15.3
Potassium: 5.4 — ABNORMAL HIGH
pH, Ven: 7.452 — ABNORMAL HIGH

## 2010-11-06 LAB — COMPREHENSIVE METABOLIC PANEL
AST: 32
AST: 54 — ABNORMAL HIGH
Albumin: 3.5
CO2: 26
Calcium: 9.1
Chloride: 103
Creatinine, Ser: 0.84
Creatinine, Ser: 0.99
GFR calc Af Amer: >60
GFR calc Af Amer: >60
GFR calc non Af Amer: >60
Glucose, Bld: 91
Total Bilirubin: 0.6

## 2010-11-06 LAB — CBC
HCT: 41.9
HCT: 42.8
Hemoglobin: 14.3
Hemoglobin: 14.7
MCHC: 34.5
MCV: 92.6
Platelets: 200
Platelets: 232
RBC: 4.62
RDW: 13.9
WBC: 10.5
WBC: 9.6

## 2010-11-06 LAB — COMPREHENSIVE METABOLIC PANEL WITH GFR
ALT: 71 — ABNORMAL HIGH
Albumin: 3.9
Alkaline Phosphatase: 67
BUN: 6
Calcium: 9.2
Potassium: 4
Sodium: 138
Total Protein: 7.3

## 2010-11-06 LAB — DIFFERENTIAL
Basophils Absolute: 0
Basophils Relative: 0
Eosinophils Absolute: 0.2
Eosinophils Relative: 1
Eosinophils Relative: 2
Lymphocytes Relative: 19
Lymphocytes Relative: 19
Lymphs Abs: 1.8
Lymphs Abs: 2
Monocytes Absolute: 0.6
Monocytes Relative: 6
Neutro Abs: 7
Neutro Abs: 7.6
Neutrophils Relative %: 73

## 2010-11-06 LAB — BASIC METABOLIC PANEL
BUN: 8
Calcium: 9.1
GFR calc non Af Amer: >60
Glucose, Bld: 98
Sodium: 140

## 2010-11-06 LAB — POCT CARDIAC MARKERS
CKMB, poc: 1.1
Myoglobin, poc: 75.8
Operator id: 294501
Troponin i, poc: <0.05

## 2010-11-06 LAB — CK TOTAL AND CKMB (NOT AT ARMC)
CK, MB: 1.8
Relative Index: 1.4
Total CK: 127

## 2010-11-06 LAB — POCT I-STAT CREATININE: Operator id: 294501

## 2010-11-06 LAB — CARDIAC PANEL(CRET KIN+CKTOT+MB+TROPI)
CK, MB: 1.3
Relative Index: 0.8
Total CK: 165
Troponin I: 0.02

## 2010-11-06 LAB — TROPONIN I: Troponin I: 0.03

## 2010-11-06 LAB — TSH: TSH: 1.673

## 2010-11-06 LAB — HEPATITIS PANEL, ACUTE: HCV Ab: NEGATIVE

## 2010-11-06 NOTE — Telephone Encounter (Signed)
Letter sent. Labs normal.

## 2010-11-12 ENCOUNTER — Ambulatory Visit: Payer: 59 | Admitting: Psychiatry

## 2010-11-13 ENCOUNTER — Ambulatory Visit: Payer: 59 | Admitting: Psychiatry

## 2010-11-20 ENCOUNTER — Ambulatory Visit (INDEPENDENT_AMBULATORY_CARE_PROVIDER_SITE_OTHER): Payer: 59 | Admitting: Psychiatry

## 2010-11-20 DIAGNOSIS — F209 Schizophrenia, unspecified: Secondary | ICD-10-CM

## 2010-11-22 ENCOUNTER — Ambulatory Visit (INDEPENDENT_AMBULATORY_CARE_PROVIDER_SITE_OTHER): Payer: 59 | Admitting: Family Medicine

## 2010-11-22 ENCOUNTER — Encounter: Payer: Self-pay | Admitting: Family Medicine

## 2010-11-22 VITALS — BP 133/76 | HR 83 | Temp 98.0°F | Wt 339.7 lb

## 2010-11-22 DIAGNOSIS — M7661 Achilles tendinitis, right leg: Secondary | ICD-10-CM | POA: Insufficient documentation

## 2010-11-22 DIAGNOSIS — M549 Dorsalgia, unspecified: Secondary | ICD-10-CM

## 2010-11-22 DIAGNOSIS — T148XXA Other injury of unspecified body region, initial encounter: Secondary | ICD-10-CM

## 2010-11-22 DIAGNOSIS — Z23 Encounter for immunization: Secondary | ICD-10-CM

## 2010-11-22 LAB — POCT URINALYSIS DIPSTICK
Blood, UA: NEGATIVE
Ketones, UA: NEGATIVE
Protein, UA: NEGATIVE
Spec Grav, UA: 1.025
Urobilinogen, UA: 1
pH, UA: 7

## 2010-11-22 MED ORDER — LIDOCAINE 5 % EX PTCH: 1.0000 | MEDICATED_PATCH | Freq: Two times a day (BID) | CUTANEOUS | Status: DC

## 2010-11-22 NOTE — Patient Instructions (Signed)
It was nice to see you today. I am not sure what your side pain is, however I don't think it's anything serious right now. If this changes, or your symptoms get worse, please come back and see me. I am giving you Lidoderm patches or your pain. Please put it on the spot that hurts for 12 hours, and then leave it off for 12 hours. You can put this on as needed for your pain.

## 2010-11-22 NOTE — Assessment & Plan Note (Signed)
Patient with side pain of undetermined etiology. The differential includes shingles, kidney stone, or pulled muscle. Kidney stone unlikely due to no blood on the UA and tenderness to palpation. Patient is guarding on several muscle relaxants and pain medications I did not add anything today. I will treat with Lidoderm patch today for topical analgesia. This may turn out to be shingles, and I told the patient not to use the patch and to return if he gets any rash. I gave him red flags for return.Marland Kitchen

## 2010-11-22 NOTE — Progress Notes (Signed)
Addended by: Ellery Plunk L on: 11/22/2010 03:44 PM   Modules accepted: Level of Service

## 2010-11-22 NOTE — Progress Notes (Signed)
  Subjective:    Patient ID: Lance Luna, male    DOB: [birth year], 40 y.o.   MRN: [id number]  HPI  Patient presents today for evaluation of side pain. This pain has been present for about one week. He states it is a 10 out of 10 when it is his worst and 5 at a time when it's better. He states it is hard to get comfortable but he can't find a comfortable position to sleep. He denies any injury. He describes the pain as sharp. He denies any changes in his urination, changes in bowel movement, shortness of breath or chest pain. He denies any rash in that area. He has never had shingles. He has had no fevers or chills.  Review of Systems Denies CP, SOB, HA, N/V/D, fever     Objective:   Physical Exam Vital signs reviewed General appearance - alert, well appearing, and in no distress and oriented to person, place, and time abd-patient with minimal pain to palpation on his left side just under his ribs. This pain starts at the spine and progresses on the dermatome to the midclavicular line. It is mildly tender to palpation. He has normal bowel sounds and is nontender in all other areas of his abdomen       Assessment & Plan:

## 2010-11-27 ENCOUNTER — Ambulatory Visit (INDEPENDENT_AMBULATORY_CARE_PROVIDER_SITE_OTHER): Payer: 59 | Admitting: Psychiatry

## 2010-11-27 DIAGNOSIS — F209 Schizophrenia, unspecified: Secondary | ICD-10-CM

## 2010-12-04 ENCOUNTER — Ambulatory Visit: Payer: 59 | Admitting: Psychiatry

## 2010-12-06 ENCOUNTER — Ambulatory Visit: Payer: 59 | Admitting: Family Medicine

## 2010-12-25 ENCOUNTER — Ambulatory Visit (INDEPENDENT_AMBULATORY_CARE_PROVIDER_SITE_OTHER): Payer: 59 | Admitting: Psychiatry

## 2010-12-25 DIAGNOSIS — F209 Schizophrenia, unspecified: Secondary | ICD-10-CM

## 2010-12-31 ENCOUNTER — Ambulatory Visit (INDEPENDENT_AMBULATORY_CARE_PROVIDER_SITE_OTHER): Payer: 59 | Admitting: Psychiatry

## 2010-12-31 DIAGNOSIS — F209 Schizophrenia, unspecified: Secondary | ICD-10-CM

## 2011-01-01 ENCOUNTER — Ambulatory Visit: Payer: 59 | Admitting: Psychiatry

## 2011-01-07 ENCOUNTER — Telehealth: Payer: Self-pay | Admitting: Family Medicine

## 2011-01-07 NOTE — Telephone Encounter (Signed)
Patient called b/c his hands began shaking today.  Described as tremor.  Also fine tremor in legs.  Resolves when he lays down.  Recurs when stands.  History of vertigo, denies any dizziness, falls, lightheadedness, LOC.  Would like to know if something is wrong.  Told him I'm not sure over the phone, but doesn't sound like an emergency.  He is to call clinic in AM for appt.   If unable to be seen, may need to present to Urgent Care.  Patient agreed with plan.

## 2011-01-08 ENCOUNTER — Ambulatory Visit (INDEPENDENT_AMBULATORY_CARE_PROVIDER_SITE_OTHER): Payer: 59 | Admitting: Psychiatry

## 2011-01-08 ENCOUNTER — Telehealth: Payer: Self-pay | Admitting: Family Medicine

## 2011-01-08 ENCOUNTER — Ambulatory Visit (INDEPENDENT_AMBULATORY_CARE_PROVIDER_SITE_OTHER): Payer: Medicare Other | Admitting: *Deleted

## 2011-01-08 VITALS — BP 130/90 | HR 84

## 2011-01-08 DIAGNOSIS — F209 Schizophrenia, unspecified: Secondary | ICD-10-CM

## 2011-01-08 DIAGNOSIS — R259 Unspecified abnormal involuntary movements: Secondary | ICD-10-CM

## 2011-01-08 DIAGNOSIS — R251 Tremor, unspecified: Secondary | ICD-10-CM

## 2011-01-08 NOTE — Telephone Encounter (Signed)
When patient stands up, he starts shaking.  Wants to know if he should come in.

## 2011-01-08 NOTE — Telephone Encounter (Signed)
Called patient.  C/o 2 episodes of shaking all over when standing on Monday, 1 episode on Tuesday night, and again this morning.  No lightheadedness or headache.  No shaking when he sits down.  Describes shaking as tremor and denies any jerking.  Told patient that I would consult with MD and call him back with recommendations.

## 2011-01-08 NOTE — Telephone Encounter (Signed)
Spoke with Dr. Leveda Anna.  Recommends have patient come in for nurse visit for orthostatic BP check and CBG.  Left message for patient to call us back for appt.

## 2011-01-08 NOTE — Telephone Encounter (Signed)
Patient called back and was offered a nurse appointment for BP check/CBG.  Unable to come in today due to an appt at [facility] hospital.  Pt will call back as needed if symptoms worsen or don't resolve.

## 2011-01-08 NOTE — Progress Notes (Signed)
Patient in office for orthostatic BP check and CBG. Complains with feeling shaky when he stands. This started 2 days ago.  When standing during orthostatic check states he feels shaky.  Not visibly shaking. CBG 83 . Last food 2.5 hours ago. BP lying 124/80 pulse 80 BP sitting 130/88 pulse 84 BP standing 130/90 pulse 84. Dr. Leveda Anna notified and he advises patient needs follow up with PCP . Appointment scheduled.

## 2011-01-15 ENCOUNTER — Ambulatory Visit (INDEPENDENT_AMBULATORY_CARE_PROVIDER_SITE_OTHER): Payer: 59 | Admitting: Psychiatry

## 2011-01-15 DIAGNOSIS — F209 Schizophrenia, unspecified: Secondary | ICD-10-CM

## 2011-01-16 ENCOUNTER — Encounter: Payer: Self-pay | Admitting: Family Medicine

## 2011-01-18 NOTE — Telephone Encounter (Signed)
This encounter was created in error - please disregard.

## 2011-01-22 ENCOUNTER — Ambulatory Visit (INDEPENDENT_AMBULATORY_CARE_PROVIDER_SITE_OTHER): Payer: 59 | Admitting: Psychiatry

## 2011-01-22 DIAGNOSIS — F209 Schizophrenia, unspecified: Secondary | ICD-10-CM

## 2011-01-23 ENCOUNTER — Ambulatory Visit (INDEPENDENT_AMBULATORY_CARE_PROVIDER_SITE_OTHER): Payer: 59 | Admitting: Family Medicine

## 2011-01-23 ENCOUNTER — Encounter: Payer: Self-pay | Admitting: Family Medicine

## 2011-01-23 VITALS — BP 132/82 | HR 86 | Temp 98.0°F | Ht 72.0 in | Wt 343.0 lb

## 2011-01-23 DIAGNOSIS — G43909 Migraine, unspecified, not intractable, without status migrainosus: Secondary | ICD-10-CM

## 2011-01-23 DIAGNOSIS — R42 Dizziness and giddiness: Secondary | ICD-10-CM

## 2011-01-23 MED ORDER — KETOROLAC TROMETHAMINE 10 MG PO TABS: 10.0000 mg | ORAL_TABLET | Freq: Four times a day (QID) | ORAL | Status: AC | PRN

## 2011-01-23 NOTE — Patient Instructions (Signed)
Please come back to see me in one month  Bring all of your medicines with you

## 2011-01-27 ENCOUNTER — Encounter: Payer: Self-pay | Admitting: Family Medicine

## 2011-01-27 DIAGNOSIS — R42 Dizziness and giddiness: Secondary | ICD-10-CM | POA: Insufficient documentation

## 2011-01-27 NOTE — Assessment & Plan Note (Signed)
Self-limited problem. Unable to elicit symptoms at all on exam today. Absolutely normal neuro exam 4 patient's baseline. Discussed symptoms with patient. He agrees to be seen again if the symptoms come back. He was given red flags for evaluation in the ED.

## 2011-01-27 NOTE — Progress Notes (Signed)
  Subjective:    Patient ID: Lance Luna, male    DOB: 01-18-1971, 40 y.o.   MRN: [id number]  HPI  Patient presents today for evaluation of a shaking feeling that he gets occasionally. He states he has had this problem twice. He says that he was walking up the steps coming home when he felt like his whole body was shaking. This sensation lasted about 15 minutes. He felt like the room was spinning to the right. He thinks this may be a relapse of his previous vertigo. He states his vertigo is not made any better with the meclizine we put him on last time. There is also no better with physical therapy. He is only had 2 episodes of the sensation in the last 2 weeks. The last time that was one week ago. He denies any chest pain, shortness of breath, nausea vomiting diarrhea, headache, vision change. He denies any weakness.  Review of Systems See above     Objective:   Physical Exam  Vital signs reviewed General appearance - alert, well appearing, and in no distress and oriented to person, place, and time Heart - normal rate, regular rhythm, normal S1, S2, no murmurs, rubs, clicks or gallops Chest - clear to auscultation, no wheezes, rales or rhonchi, symmetric air entry, no tachypnea, retractions or cyanosis Neuro-Dix-Hallpike negative bilaterally. Cranial nerves II through XII intact. Strength normal in all 4 extremities.      Assessment & Plan:

## 2011-01-29 ENCOUNTER — Ambulatory Visit: Payer: 59 | Admitting: Psychiatry

## 2011-02-01 ENCOUNTER — Other Ambulatory Visit: Payer: Self-pay | Admitting: Family Medicine

## 2011-02-03 ENCOUNTER — Other Ambulatory Visit: Payer: Self-pay | Admitting: Family Medicine

## 2011-02-03 MED ORDER — OMEPRAZOLE 40 MG PO CPDR: 40.0000 mg | DELAYED_RELEASE_CAPSULE | Freq: Every day | ORAL | Status: DC

## 2011-02-03 NOTE — Telephone Encounter (Signed)
Refill request

## 2011-02-05 ENCOUNTER — Ambulatory Visit (INDEPENDENT_AMBULATORY_CARE_PROVIDER_SITE_OTHER): Payer: 59 | Admitting: Psychiatry

## 2011-02-05 DIAGNOSIS — F209 Schizophrenia, unspecified: Secondary | ICD-10-CM

## 2011-02-19 ENCOUNTER — Ambulatory Visit (INDEPENDENT_AMBULATORY_CARE_PROVIDER_SITE_OTHER): Payer: 59 | Admitting: Psychiatry

## 2011-02-19 DIAGNOSIS — F209 Schizophrenia, unspecified: Secondary | ICD-10-CM

## 2011-02-21 ENCOUNTER — Ambulatory Visit (INDEPENDENT_AMBULATORY_CARE_PROVIDER_SITE_OTHER): Payer: Medicare Other | Admitting: Family Medicine

## 2011-02-21 ENCOUNTER — Encounter: Payer: Self-pay | Admitting: Family Medicine

## 2011-02-21 VITALS — BP 137/84 | HR 80 | Ht 72.0 in | Wt 344.0 lb

## 2011-02-21 DIAGNOSIS — Z5181 Encounter for therapeutic drug level monitoring: Secondary | ICD-10-CM | POA: Insufficient documentation

## 2011-02-21 NOTE — Progress Notes (Signed)
  Subjective:    Patient ID: Lance Luna, male    DOB: 06-Apr-1970, 41 y.o.   MRN: [id number]  HPI Patient here for medication review. Did not bring medications so we'll no charges visit.   Review of Systems     Objective:   Physical Exam        Assessment & Plan:

## 2011-02-24 ENCOUNTER — Ambulatory Visit (INDEPENDENT_AMBULATORY_CARE_PROVIDER_SITE_OTHER): Payer: Medicare Other | Admitting: Family Medicine

## 2011-02-24 ENCOUNTER — Encounter: Payer: Self-pay | Admitting: Family Medicine

## 2011-02-24 DIAGNOSIS — T148XXA Other injury of unspecified body region, initial encounter: Secondary | ICD-10-CM

## 2011-02-24 DIAGNOSIS — Z5181 Encounter for therapeutic drug level monitoring: Secondary | ICD-10-CM

## 2011-02-24 DIAGNOSIS — I10 Essential (primary) hypertension: Secondary | ICD-10-CM

## 2011-02-24 DIAGNOSIS — M542 Cervicalgia: Secondary | ICD-10-CM

## 2011-02-24 MED ORDER — HYDROCHLOROTHIAZIDE 25 MG PO TABS: 25.0000 mg | ORAL_TABLET | Freq: Every day | ORAL | Status: DC

## 2011-02-24 MED ORDER — ARIPIPRAZOLE 10 MG PO TABS: 10.0000 mg | ORAL_TABLET | Freq: Every day | ORAL | Status: DC

## 2011-02-24 NOTE — Patient Instructions (Signed)
Increase her HCTZ to 25 mg, or 2 tablets per day. I will send a new prescription.  RANGE OF MOTION (ROM) AND STRETCHING EXERCISES - Achilles Tendinitis  These exercises may help you when beginning to rehabilitate your injury. Your symptoms may resolve with or without further involvement from your physician, physical therapist or athletic trainer. While completing these exercises, remember:  Restoring tissue flexibility helps normal motion to return to the joints. This allows healthier, less painful movement and activity.  An effective stretch should be held for at least 30 seconds.  A stretch should never be painful. You should only feel a gentle lengthening or release in the stretched tissue.  STRETCH - Gastroc, Standing  Place hands on wall.  Extend right / left leg, keeping the front knee somewhat bent.  Slightly point your toes inward on your back foot.  Keeping your right / left heel on the floor and your knee straight, shift your weight toward the wall, not allowing your back to arch.  You should feel a gentle stretch in the right / left calf. Hold this position for __________ seconds.  Repeat __________ times. Complete this stretch __________ times per day.  STRETCH - Soleus, Standing  Place hands on wall.  Extend right / left leg, keeping the other knee somewhat bent.  Slightly point your toes inward on your back foot.  Keep your right / left heel on the floor, bend your back knee, and slightly shift your weight over the back leg so that you feel a gentle stretch deep in your back calf.  Hold this position for __________ seconds.  Repeat __________ times. Complete this stretch __________ times per day.  STRETCH - Gastrocsoleus, Standing  Note: This exercise can place a lot of stress on your foot and ankle. Please complete this exercise only if specifically instructed by your caregiver.  Place the ball of your right / left foot on a step, keeping your other foot firmly on the same  step.  Hold on to the wall or a rail for balance.  Slowly lift your other foot, allowing your body weight to press your heel down over the edge of the step.  You should feel a stretch in your right / left calf.  Hold this position for __________ seconds.  Repeat this exercise with a slight bend in your knee.  Repeat __________ times. Complete this stretch __________ times per day.  STRENGTHENING EXERCISES - Achilles Tendinitis  These exercises may help you when beginning to rehabilitate your injury. They may resolve your symptoms with or without further involvement from your physician, physical therapist or athletic trainer. While completing these exercises, remember:  Muscles can gain both the endurance and the strength needed for everyday activities through controlled exercises.  Complete these exercises as instructed by your physician, physical therapist or athletic trainer. Progress the resistance and repetitions only as guided.  You may experience muscle soreness or fatigue, but the pain or discomfort you are trying to eliminate should never worsen during these exercises. If this pain does worsen, stop and make certain you are following the directions exactly. If the pain is still present after adjustments, discontinue the exercise until you can discuss the trouble with your clinician.  STRENGTH - Plantar-flexors  Sit with your right / left leg extended. Holding onto both ends of a rubber exercise band/tubing, loop it around the ball of your foot. Keep a slight tension in the band.  Slowly push your toes away from you, pointing them  downward.  Hold this position for __________ seconds. Return slowly, controlling the tension in the band/tubing.  Repeat __________ times. Complete this exercise __________ times per day.  STRENGTH - Plantar-flexors  Stand with your feet shoulder width apart. Steady yourself with a wall or table using as little support as needed.  Keeping your weight evenly spread  over the width of your feet, rise up on your toes.*  Hold this position for __________ seconds.  Repeat __________ times. Complete this exercise __________ times per day.  *If this is too easy, shift your weight toward your right / left leg until you feel challenged. Ultimately, you may be asked to do this exercise with your right / left foot only.  STRENGTH - Plantar-flexors, Eccentric  Note: This exercise can place a lot of stress on your foot and ankle. Please complete this exercise only if specifically instructed by your caregiver.  Place the balls of your feet on a step. With your hands, use only enough support from a wall or rail to keep your balance.  Keep your knees straight and rise up on your toes.  Slowly shift your weight entirely to your right / left toes and pick up your opposite foot. Gently and with controlled movement, lower your weight through your right / left foot so that your heel drops below the level of the step. You will feel a slight stretch in the back of your calf at the end position.  Use the healthy leg to help rise up onto the balls of both feet, then lower weight only on the right / left leg again. Build up to 15 repetitions. Then progress to 3 consecutive sets of 15 repetitions.*  After completing the above exercise, complete the same exercise with a slight knee bend (about 30 degrees). Again, build up to 15 repetitions. Then progress to 3 consecutive sets of 15 repetitions.*  Perform this exercise __________ times per day.  *When you easily complete 3 sets of 15, your physician, physical therapist or athletic trainer may advise you to add resistance by wearing a backpack filled with additional weight.  STRENGTH - Plantar Flexors, Seated  Sit on a chair that allows your feet to rest flat on the ground. If necessary, sit at the edge of the chair.  Keeping your toes firmly on the ground, lift your right / left heel as far as you can without increasing any discomfort in  your ankle.  Repeat __________ times. Complete this exercise __________ times a day.  *If instructed by your physician, physical therapist or athletic trainer, you may add ____________________ of resistance by placing a weighted object on your right / left knee.  Document Released: 09/04/2004 Document Revised: 10/16/2010 Document Reviewed: 05/18/2008  [facility] Patient Information 2012 [place], [place].

## 2011-02-24 NOTE — Assessment & Plan Note (Signed)
Soreness of the Achilles tendon. Gave stretches and strengthening exercises. Gave heel cup for shoe.

## 2011-02-24 NOTE — Progress Notes (Signed)
  Subjective:    Patient ID: Lance Luna, male    DOB: 10-Oct-1970, 41 y.o.   MRN: [id number]  HPI  Patient here to discuss heel pain and review medications. Heel pain-patient has pain in the posterior part of the pill at the Achilles tendon is worse in the morning when she walks and does get better through the day. He is not doing any stretches. He has not tried anything for this pain. He is able to walk and continues to walk for exercise.  Hypertension-patient is taking his HCTZ every day. This morning he drank coffee before coming in. He denies any shortness of breath or chest pain.  Headaches-patient has Toradol at home to use for headaches.  Is not having headaches recently.`  Review of Systems    Denies CP, SOB, HA, N/V/D, fever  Objective:   Physical Exam Vital signs reviewed General appearance - alert, well appearing, and in no distress and oriented to person, place, and time Heart - normal rate, regular rhythm, normal S1, S2, no murmurs, rubs, clicks or gallops Chest - clear to auscultation, no wheezes, rales or rhonchi, symmetric air entry, no tachypnea, retractions or cyanosis Extremities-left foot with pain at insertion of Achilles tendon. No nodule. No swelling.       Assessment & Plan:

## 2011-02-24 NOTE — Assessment & Plan Note (Signed)
Reviewed medications today. Updated on list.

## 2011-02-24 NOTE — Assessment & Plan Note (Signed)
Increase HCTZ to 25 mg by mouth daily. See back in one month for followup.

## 2011-02-26 ENCOUNTER — Ambulatory Visit: Payer: Medicare Other | Admitting: Psychiatry

## 2011-03-05 ENCOUNTER — Ambulatory Visit: Payer: Medicare Other | Admitting: Psychiatry

## 2011-03-06 ENCOUNTER — Ambulatory Visit (INDEPENDENT_AMBULATORY_CARE_PROVIDER_SITE_OTHER): Payer: 59 | Admitting: Psychiatry

## 2011-03-06 DIAGNOSIS — F209 Schizophrenia, unspecified: Secondary | ICD-10-CM

## 2011-03-12 ENCOUNTER — Ambulatory Visit (INDEPENDENT_AMBULATORY_CARE_PROVIDER_SITE_OTHER): Payer: 59 | Admitting: Psychiatry

## 2011-03-12 DIAGNOSIS — F209 Schizophrenia, unspecified: Secondary | ICD-10-CM

## 2011-03-19 ENCOUNTER — Ambulatory Visit (INDEPENDENT_AMBULATORY_CARE_PROVIDER_SITE_OTHER): Payer: 59 | Admitting: Psychiatry

## 2011-03-19 DIAGNOSIS — F209 Schizophrenia, unspecified: Secondary | ICD-10-CM

## 2011-03-25 ENCOUNTER — Ambulatory Visit (INDEPENDENT_AMBULATORY_CARE_PROVIDER_SITE_OTHER): Payer: Medicare Other | Admitting: Family Medicine

## 2011-03-25 ENCOUNTER — Encounter: Payer: Self-pay | Admitting: Family Medicine

## 2011-03-25 VITALS — BP 130/86 | HR 60 | Temp 98.5°F | Ht 72.0 in | Wt 338.9 lb

## 2011-03-25 DIAGNOSIS — K921 Melena: Secondary | ICD-10-CM | POA: Insufficient documentation

## 2011-03-25 DIAGNOSIS — T148XXA Other injury of unspecified body region, initial encounter: Secondary | ICD-10-CM

## 2011-03-25 DIAGNOSIS — I10 Essential (primary) hypertension: Secondary | ICD-10-CM

## 2011-03-25 DIAGNOSIS — R195 Other fecal abnormalities: Secondary | ICD-10-CM

## 2011-03-25 NOTE — Progress Notes (Signed)
  Subjective:    Patient ID: Lance Luna, male    DOB: [birth year], 41 y.o.   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 He states he is performing stretches that he was given and last encounter. He has not gotten a heel lift. He does not feel any better. He states it still hurts him to walk especially when he is getting up from sitting. No recent antibiotics, no injury. He denies swelling in the area.  Heartburn-patient notes that he is continuing to have heartburn symptoms at least 2-3 times a week even though he is taking his Prilosec. He also notes that occasionally he has dark black stools. He does not have black stools now. He states that he has had foul-smelling stools throughout his life. His stools have not changed in caliber or frequency. He denies constipation. He denies unintentional weight loss  Hypertension-patient denies headaches, shortness breath, chest pain, dizziness. He is taking his medication as prescribed. He's not 6 blood pressure at home. He is trying to exercise and eat less processed foods.  Patient is a nonsmoker   Review of Systems See above    Objective:   Physical Exam Vital signs reviewed General appearance - alert, well appearing, and in no distress and oriented to person, place, and time Heart - normal rate, regular rhythm, normal S1, S2, no murmurs, rubs, clicks or gallops Chest - clear to auscultation, no wheezes, rales or rhonchi, symmetric air entry, no tachypnea, retractions or cyanosis Abdomen - soft, nontender, nondistended, no masses or organomegaly EXT-right foot and leg examined. No swelling or prominence on the Achilles. Full range of motion.       Assessment & Plan:

## 2011-03-25 NOTE — Assessment & Plan Note (Signed)
Patient complains of intermittent dark, black stool. No medications that would cause this including Pepto-Bismol. In addition patient still having heartburn symptoms even on PPI. Will send to GI for evaluation. Patient not on long-term NSAID or other medication that could cause stomach ulcer.

## 2011-03-25 NOTE — Assessment & Plan Note (Signed)
Achilles tendinitis not better with conservative measures including stretching. Patient has not tried a heel lift which I emphasized again today he should try. Patient has tried lidocaine patch which did not help. I will send him for evaluation by physical therapy to see if other modalities may help. Consider sports medicine referral for possible orthotics.

## 2011-03-25 NOTE — Patient Instructions (Signed)
Your blood pressure is better today. We will call you with an appointment for physical therapy. We will call you with your appointment for GI. Please try to get a heel lift for your shoe to see if that helps. Continue with the stretches.

## 2011-03-25 NOTE — Assessment & Plan Note (Signed)
BP Readings from Last 3 Encounters:  03/25/11 130/86  02/24/11 159/83  02/21/11 137/84   Blood pressure improved today. Continue current management.

## 2011-03-26 ENCOUNTER — Ambulatory Visit: Payer: 59 | Admitting: Psychiatry

## 2011-03-27 ENCOUNTER — Ambulatory Visit: Payer: 59 | Admitting: Psychiatry

## 2011-03-31 ENCOUNTER — Ambulatory Visit: Payer: 59

## 2011-04-02 ENCOUNTER — Ambulatory Visit (INDEPENDENT_AMBULATORY_CARE_PROVIDER_SITE_OTHER): Payer: 59 | Admitting: Psychiatry

## 2011-04-02 DIAGNOSIS — F209 Schizophrenia, unspecified: Secondary | ICD-10-CM

## 2011-04-04 ENCOUNTER — Telehealth: Payer: Self-pay | Admitting: Family Medicine

## 2011-04-04 NOTE — Telephone Encounter (Signed)
Returned call to patient.  Wants to know what he can take for cough until he can be seen on Monday.  Informed patient he can try OTC Delsym or Robitussin, drink plenty of fluids, and can use hard candy.  Gaylene Brooks, RN

## 2011-04-04 NOTE — Telephone Encounter (Signed)
Patient is calling to make an appt for Monday to be seen for a cough.  He would like some direction as to how to treat it for the weekend.

## 2011-04-07 ENCOUNTER — Ambulatory Visit (INDEPENDENT_AMBULATORY_CARE_PROVIDER_SITE_OTHER): Payer: 59 | Admitting: Family Medicine

## 2011-04-07 ENCOUNTER — Encounter: Payer: Self-pay | Admitting: Family Medicine

## 2011-04-07 VITALS — BP 149/83 | HR 69 | Temp 97.9°F | Ht 72.0 in | Wt 332.9 lb

## 2011-04-07 DIAGNOSIS — R05 Cough: Secondary | ICD-10-CM

## 2011-04-07 DIAGNOSIS — R059 Cough, unspecified: Secondary | ICD-10-CM

## 2011-04-07 MED ORDER — BENZONATATE 100 MG PO CAPS: 100.0000 mg | ORAL_CAPSULE | Freq: Four times a day (QID) | ORAL | Status: DC | PRN

## 2011-04-07 NOTE — Assessment & Plan Note (Signed)
Viral cough, minimally productive, with some MSK pain with cough. Will treat with Tessalon Perles and symptomatic treatment. See back in 2 weeks if not better, earlier if worse

## 2011-04-07 NOTE — Progress Notes (Signed)
  Subjective:     Lance Luna is a 41 y.o. male who presents for evaluation of nonproductive cough. Symptoms began 5 days ago. Symptoms have been unchanged since that time. Past history is significant for asthma. He states he has some chest wall pain when he coughs. He does not have any chest pain or shortness of breath between coughs. Denies fevers. Reports some mild congestion.  The following portions of the patient's history were reviewed and updated as appropriate: allergies, current medications and problem list.  Review of Systems Constitutional: negative for chills, fatigue, fevers and malaise Respiratory: negative except for cough Cardiovascular: negative    Objective:    BP 149/83  Pulse 69  Temp(Src) 97.9 F (36.6 C) (Oral)  Ht 6' (1.829 m)  Wt 332 lb 13.9 oz (150.989 kg)  BMI 45.15 kg/m2  SpO2 98% General appearance: alert and cooperative Lungs: clear to auscultation bilaterally Chest wall: left sided chest wall tenderness    Assessment:    Cough    Plan:    Worsening signs and symptoms discussed. Rest, fluids, acetaminophen, and humidification. Follow up as needed for persistent, worsening cough, or appearance of new symptoms.

## 2011-04-07 NOTE — Patient Instructions (Signed)
Cough, Adult  A cough is a reflex. It helps you clear your throat and airways. A cough can help heal your body. A cough can last 2 or 3 weeks (acute) or may last more than 8 weeks (chronic). Some common causes of a cough can include an infection, allergy, or a cold. HOME CARE  Only take medicine as told by your doctor.     If given, take your medicines (antibiotics) as told. Finish them even if you start to feel better.     Use a cold steam vaporizer or humidier in your home. This can help loosen thick spit (secretions).     Sleep so you are almost sitting up (semi-upright). Use pillows to do this. This helps reduce coughing.     Rest as needed.     Stop smoking if you smoke.  GET HELP RIGHT AWAY IF:  You have yellowish-white fluid (pus) in your thick spit.     Your cough gets worse.     Your medicine does not reduce coughing, and you are losing sleep.     You cough up blood.     You have trouble breathing.     Your pain gets worse and medicine does not help.     You have a fever.  MAKE SURE YOU:    Understand these instructions.     Will watch your condition.     Will get help right away if you are not doing well or get worse.  Document Released: 10/17/2010 Document Reviewed: 08/12/2010 [employer] Patient Information 2012 [employer]. 

## 2011-04-09 ENCOUNTER — Ambulatory Visit: Payer: 59 | Admitting: Psychiatry

## 2011-04-14 ENCOUNTER — Telehealth: Payer: Self-pay | Admitting: Family Medicine

## 2011-04-14 NOTE — Telephone Encounter (Signed)
Patient called to find out why his doctor has not called back.  Is still having problems with cough that is non-productive but very irritating.  He is not having any fevers/chill or other systemic symptoms.  Suggested benadryl for possible post-nasal drip contribution plus a workin appointment in the AM.

## 2011-04-14 NOTE — Telephone Encounter (Signed)
Pt states that the tessalon pearls are not helping and has taken musinex and tussin.  Nothing is helping.

## 2011-04-14 NOTE — Telephone Encounter (Signed)
Patient states none of the medications are helping. He feels  congestion is in his lungs. Nonproductive. No fever. Continuous cough.  Will forward message to MD.

## 2011-04-14 NOTE — Telephone Encounter (Signed)
Patient has called back checking on this.

## 2011-04-14 NOTE — Telephone Encounter (Signed)
Patient is calling back again to check on this same problem.

## 2011-04-14 NOTE — Telephone Encounter (Signed)
Patient has called back again

## 2011-04-15 ENCOUNTER — Ambulatory Visit (INDEPENDENT_AMBULATORY_CARE_PROVIDER_SITE_OTHER): Payer: 59 | Admitting: Family Medicine

## 2011-04-15 ENCOUNTER — Encounter: Payer: Self-pay | Admitting: Family Medicine

## 2011-04-15 VITALS — BP 138/92 | HR 74 | Temp 98.0°F | Ht 72.0 in | Wt 331.0 lb

## 2011-04-15 DIAGNOSIS — R059 Cough, unspecified: Secondary | ICD-10-CM

## 2011-04-15 DIAGNOSIS — R05 Cough: Secondary | ICD-10-CM

## 2011-04-15 MED ORDER — GUAIFENESIN-CODEINE 100-10 MG/5ML PO SYRP: 5.0000 mL | ORAL_SOLUTION | Freq: Three times a day (TID) | ORAL | Status: DC | PRN

## 2011-04-15 MED ORDER — HYDROCODONE-HOMATROPINE 5-1.5 MG/5ML PO SYRP: 5.0000 mL | ORAL_SOLUTION | Freq: Four times a day (QID) | ORAL | Status: AC | PRN

## 2011-04-15 MED ORDER — AZITHROMYCIN 500 MG PO TABS: 500.0000 mg | ORAL_TABLET | Freq: Every day | ORAL | Status: AC

## 2011-04-15 MED ORDER — PREDNISONE 50 MG PO TABS: ORAL_TABLET | ORAL | Status: AC

## 2011-04-15 NOTE — Assessment & Plan Note (Addendum)
Likely post-viral bronchitis versus asthma exacerbation versus possible lower respiratory tract infection due to "second sickening." Encouraged to use albuterol daily for next 5 days or so. Provided Azithromycin x 3 days for bacterial infection. Also Prednisone due to wheezing on exam today and worsening of asthma without any inhaler use.   FU in 1 week if no improvement, sooner if worsening.   ** Patient called back to [facility] too expensive, filled Guaif-Codeine instead,handed printed prescription to [facility]

## 2011-04-15 NOTE — Telephone Encounter (Signed)
Called pt.  Told him to make appt.  He will call front office

## 2011-04-15 NOTE — Telephone Encounter (Signed)
Pt called again asking to speak with Dr Hulen Luster

## 2011-04-15 NOTE — Progress Notes (Signed)
  Subjective:    Patient ID: Lance Luna, male    DOB: [birth year], 41 y.o.   MRN: [id number]  HPI  1.  Persistent cough: Diagnosed with URI 2 weeks ago, treated symptomatically at that time and [employer]. States these did not help at all. He does have diagnosis of asthma but states he has not been using his albuterol inhaler on the past 2 weeks.  Patient felt he improved 1-2 days after being seen but then acutely worsened.  Feels each day he is a little worse than day before.  Persistent cough that is generally nonproductive but worse at night. No fevers or chills.  Review of Systems See HPI above for review of systems.       Objective:   Physical Exam Gen:  Alert, cooperative patient who appears stated age in no acute distress.  Patient does look like he feels ill BP 138/92  Pulse 74  Temp(Src) 98 F (36.7 C) (Oral)  Ht 6' (1.829 m)  Wt 331 lb (150.141 kg)  BMI 44.89 kg/m2  Head: Normocephalic atraumatic Eyes: EOMI, PERRL, sclera and conjunctiva non-erythematous Nose:  Nasal turbinates grossly enlarged bilaterally. Some exudates noted. Tender to palpation of maxillary sinus  Mouth: Mucosa membranes moist. Tonsils +2, nonenlarged, non-erythematous. Neck: No cervical lymphadenopathy noted Heart:  RRR, no murmurs auscultated. Pulm:  Normal work of breathing and good air movement. Some mild wheezing noted throughout.          Assessment & Plan:

## 2011-04-15 NOTE — Telephone Encounter (Signed)
Dr. Hulen Luster  contacted patient this AM and advised him to schedule appointment today patient states . Appointment scheduled

## 2011-04-15 NOTE — Patient Instructions (Signed)
Take the cough syrup every 6 hours if you need to.  It can make you sleepy. Take the Prednisone steroid for 5 days, 1 pill a day. Take antibiotic (Azithryomycin) 1 pill a day for 5 days. Use your inhaler every 6 hours if you need it but especially before bed this week.  If you're not feeling better by next week come back and see me.

## 2011-04-15 NOTE — Progress Notes (Signed)
Addended by: Rosman Cellar on: 04/15/2011 11:45 AM   Modules accepted: Orders

## 2011-04-16 ENCOUNTER — Ambulatory Visit: Payer: 59 | Admitting: Psychiatry

## 2011-04-18 ENCOUNTER — Telehealth: Payer: Self-pay | Admitting: Family Medicine

## 2011-04-18 MED ORDER — GUAIFENESIN-CODEINE 100-10 MG/5ML PO SYRP: 5.0000 mL | ORAL_SOLUTION | Freq: Three times a day (TID) | ORAL | Status: AC | PRN

## 2011-04-18 NOTE — Telephone Encounter (Signed)
Subjective: Patient seen in clinic 02/26 for cough and given cough medication with codeine. He has taken all of it and is requesting a refill due to persistent cough that makes it difficult for him to sleep.  He has finished his course of azithromycin and prednisone for asthma exacerbation in setting of URI and requesting more of these medications as well.  He is using his albuterol inhaler once a day for wheezing.   A/P:  Encouraged him to use albuterol more often (up to every 4 hours for wheezing). No more azithromycin/prednisone at this time. Return to clinic Monday if symptoms not improved.  Will send in more cough medication, although he should not have finished the cough medication at this time if he was taking it appropriately. (5mL a day, given 120 mL). Told him to only take as directed. Will refill since it provides significant relief of cough.

## 2011-04-23 ENCOUNTER — Ambulatory Visit (INDEPENDENT_AMBULATORY_CARE_PROVIDER_SITE_OTHER): Payer: 59 | Admitting: Psychiatry

## 2011-04-23 DIAGNOSIS — F209 Schizophrenia, unspecified: Secondary | ICD-10-CM

## 2011-04-30 ENCOUNTER — Other Ambulatory Visit: Payer: Self-pay | Admitting: Family Medicine

## 2011-04-30 ENCOUNTER — Ambulatory Visit (INDEPENDENT_AMBULATORY_CARE_PROVIDER_SITE_OTHER): Payer: 59 | Admitting: Psychiatry

## 2011-04-30 DIAGNOSIS — F209 Schizophrenia, unspecified: Secondary | ICD-10-CM

## 2011-04-30 NOTE — Telephone Encounter (Signed)
Patient is calling because he was told on 3/1 by Dr. Madolyn Frieze that there would be a refill of his cough medicine sent in to his pharmacy, but they haven't gotten it yet.

## 2011-04-30 NOTE — Telephone Encounter (Signed)
Spoke with patient, informed him that cough med was ready to be picked up. Patient upset because he wants refills of azithromycin and prednisone and I informed him that he would have to come in for an appointment for these. Patient upset and demands MD call him. Appointment made for 3/14 with PCP.

## 2011-04-30 NOTE — Telephone Encounter (Signed)
Called pt.  Cough is the same.  Since February.  Feels like there is a lot of mucuous in lungs.  Felt better on prednisone and abx.  Discussed need to come in.  Discussed CANNOT do refills on emergency line.

## 2011-05-01 ENCOUNTER — Ambulatory Visit (INDEPENDENT_AMBULATORY_CARE_PROVIDER_SITE_OTHER): Payer: 59 | Admitting: Family Medicine

## 2011-05-01 ENCOUNTER — Encounter: Payer: Self-pay | Admitting: Family Medicine

## 2011-05-01 VITALS — BP 138/83 | HR 82 | Temp 98.0°F | Ht 72.0 in | Wt 342.0 lb

## 2011-05-01 DIAGNOSIS — R05 Cough: Secondary | ICD-10-CM

## 2011-05-01 DIAGNOSIS — R059 Cough, unspecified: Secondary | ICD-10-CM

## 2011-05-01 DIAGNOSIS — J309 Allergic rhinitis, unspecified: Secondary | ICD-10-CM

## 2011-05-01 MED ORDER — FLUTICASONE PROPIONATE 50 MCG/ACT NA SUSP: 2.0000 | Freq: Every day | NASAL | Status: DC

## 2011-05-01 NOTE — Progress Notes (Signed)
  Subjective:    Patient ID: Lance Luna, male    DOB: 1970/05/26, 41 y.o.   MRN: [id number]  HPI  Patient here for cough. The cough has been lingering for 2-3 weeks. Initially it started with head congestion and productive cough. Now he just has the dry cough. He is however continuing to sneeze. He does not have any mucus discharge from his nose. He denies any fevers. He has seasonal allergies in the past. He has been on Claritin.  Review of Systems Denies nausea, vomiting, diarrhea    Objective:   Physical Exam Vital signs reviewed General appearance - alert, well appearing, and in no distress and oriented to person, place, and time Heart - normal rate, regular rhythm, normal S1, S2, no murmurs, rubs, clicks or gallops Chest - clear to auscultation, no wheezes, rales or rhonchi, symmetric air entry, no tachypnea, retractions or cyanosis HEENT-moist mucous membranes, bilateral TMs blocked by wax, nares with swoellen turbinates, oropharynx clear, pink       Assessment & Plan:

## 2011-05-01 NOTE — Assessment & Plan Note (Signed)
Lingering cough. Likely post viral versus allergic with postnasal drip. No wheezing, rhonchi, crackles. No fevers. I do not think that there is a pneumonia or other infectious origin of the cough. He is to return if his cough gets worse, he gets fevers.

## 2011-05-01 NOTE — Patient Instructions (Signed)
Think that you're having some trouble with allergies as well as a lingering cough Please continue the cough medicine as well as get some over-the-counter cough drops Start the Flonase for your nose for your allergies 2 sprays each nostril each day  Please come back if your cough gets worse, you get fever or you have other concerns  Please do not call after hours for refills as we cannot feel them then I will try to call you as soon as I can after you place a call to the office

## 2011-05-02 ENCOUNTER — Telehealth: Payer: Self-pay | Admitting: Family Medicine

## 2011-05-02 NOTE — Telephone Encounter (Signed)
Pt is still having cough and wants to know what he should do.  He was just seen the other day.

## 2011-05-02 NOTE — Telephone Encounter (Signed)
Spoke with patient and he states he has started he Flonase. Explained that it will take  several days for this to get in his system and start working.   He wants prednisone refill and another bottle of medicine that Dr. Gwendolyn Grant gave  him . States the problem is in his lungs. Tried to explain that on listening to chest and lungs Dr. Hulen Luster had noted that it was clear. Will forward message to Dr. Hulen Luster to please call patient .

## 2011-05-07 ENCOUNTER — Ambulatory Visit (INDEPENDENT_AMBULATORY_CARE_PROVIDER_SITE_OTHER): Payer: 59 | Admitting: Psychiatry

## 2011-05-07 DIAGNOSIS — F209 Schizophrenia, unspecified: Secondary | ICD-10-CM

## 2011-05-07 NOTE — Telephone Encounter (Signed)
I have spoken with this patient about this several times. I would be happy to see him again in the office.

## 2011-05-09 ENCOUNTER — Ambulatory Visit
Admission: RE | Admit: 2011-05-09 | Discharge: 2011-05-09 | Disposition: A | Discharge status: Home / Self Care | Payer: Medicare Other | Source: Ambulatory Visit | Attending: Family Medicine | Admitting: Family Medicine

## 2011-05-09 ENCOUNTER — Ambulatory Visit (INDEPENDENT_AMBULATORY_CARE_PROVIDER_SITE_OTHER): Payer: Medicare Other | Admitting: Family Medicine

## 2011-05-09 VITALS — BP 144/84 | HR 78 | Temp 97.8°F | Ht 72.0 in | Wt 348.0 lb

## 2011-05-09 DIAGNOSIS — R05 Cough: Secondary | ICD-10-CM

## 2011-05-09 DIAGNOSIS — R059 Cough, unspecified: Secondary | ICD-10-CM

## 2011-05-09 NOTE — Patient Instructions (Signed)
I am sorry you have been coughing so much Please go and get a chest x-ray I will see you next week and we will talk about the results Continue the Flonase Try over-the-counter cough drops

## 2011-05-09 NOTE — Progress Notes (Signed)
  Subjective:    Patient ID: Lance Luna, male    DOB: 20-Dec-1970, 41 y.o.   MRN: [id number]  HPI  Patient presents for reevaluation of cough. His cough has been going on for 4 weeks. He occasionally has some sweaty spells that he denies fevers. He is not short of breath. He is taking his Flonase x1 week but is not helping. He is taking cough medicine which is also not helping much. He occasionally coughs so much he feels like he is going to throw up but has not. He feels like he does not have postnasal drip.  Patient says that he has some chest pain with deep breath and cough. It also hurts when he puts any pressure on his ribs. He is a nonsmoker. He has not felt like he is wheezing.  Review of Systems Denies weight loss. Denies sick contacts. Denies dizziness.    Objective:   Physical Exam Vital signs reviewed General appearance - alert, well appearing, and in no distress Chest - clear to auscultation, no wheezes, rales or rhonchi, symmetric air entry, no tachypnea, retractions or cyanosis Heart - normal rate, regular rhythm, normal S1, S2, no murmurs, rubs, clicks or gallops  Ears - bilateral TM's and external ear canals normal, right ear normal, left ear normal Nose - normal and patent, no erythema, discharge or polyps        Assessment & Plan:

## 2011-05-11 ENCOUNTER — Encounter: Payer: Self-pay | Admitting: Family Medicine

## 2011-05-11 NOTE — Assessment & Plan Note (Signed)
Pt with 4 weeks of cough that is slowly improving.  No fevers, weight loss, travel, leg swelling, SOB, wheezing.  Will check cxr and continue symptomatic treatment.  Nonsmoker.  See back 1 week to ensure continued improvement.

## 2011-05-12 ENCOUNTER — Telehealth: Payer: Self-pay | Admitting: Family Medicine

## 2011-05-12 NOTE — Telephone Encounter (Signed)
error 

## 2011-05-14 ENCOUNTER — Ambulatory Visit: Payer: 59 | Admitting: Psychiatry

## 2011-05-15 ENCOUNTER — Ambulatory Visit (INDEPENDENT_AMBULATORY_CARE_PROVIDER_SITE_OTHER): Payer: Medicare Other | Admitting: Family Medicine

## 2011-05-15 ENCOUNTER — Encounter: Payer: Self-pay | Admitting: Family Medicine

## 2011-05-15 VITALS — BP 145/86 | HR 97 | Temp 98.2°F | Ht 72.0 in | Wt 343.6 lb

## 2011-05-15 DIAGNOSIS — R059 Cough, unspecified: Secondary | ICD-10-CM

## 2011-05-15 DIAGNOSIS — I10 Essential (primary) hypertension: Secondary | ICD-10-CM

## 2011-05-15 DIAGNOSIS — Z23 Encounter for immunization: Secondary | ICD-10-CM

## 2011-05-15 DIAGNOSIS — E785 Hyperlipidemia, unspecified: Secondary | ICD-10-CM

## 2011-05-15 DIAGNOSIS — R05 Cough: Secondary | ICD-10-CM

## 2011-05-15 MED ORDER — BENZONATATE 100 MG PO CAPS: 100.0000 mg | ORAL_CAPSULE | Freq: Four times a day (QID) | ORAL | Status: DC | PRN

## 2011-05-15 NOTE — Assessment & Plan Note (Signed)
Patient with continued cough. Cough somewhat improved. Negative chest x-ray. Plan to represcribe Tessalon Perles. Likely a long-lasting viral cough.

## 2011-05-15 NOTE — Assessment & Plan Note (Signed)
BP Readings from Last 3 Encounters:  05/15/11 145/86  05/09/11 144/84  05/01/11 138/83   Blood pressure above goal today. I think this is due to his cough medicine use. I would like him to return when he is feeling better to have his blood pressure rechecked. If it is still elevated we'll start a new medication

## 2011-05-15 NOTE — Progress Notes (Signed)
  Subjective:    Patient ID: Lance Luna, male    DOB: 1970-05-07, 41 y.o.   MRN: [id number]  HPI  Patient presents today for reevaluation of cough. He thinks his cough has gotten slightly better. He does not think that the cough syrup is helping very much. He continues to take over-the-counter cough medicines. He thinks that the [employer] helped a little bit. He denies fevers, shortness of breath. He has not had to use his albuterol recently. he does not have chest pain.  Review of Systems    Denies CP, SOB, HA, N/V/D, fever  Objective:   Physical Exam Vital signs reviewed General appearance - alert, well appearing, and in no distress Heart - normal rate, regular rhythm, normal S1, S2, no murmurs, rubs, clicks or gallops Chest - clear to auscultation, no wheezes, rales or rhonchi, symmetric air entry, no tachypnea, retractions or cyanosis Abdomen - soft, nontender, nondistended, no masses or organomegaly        Assessment & Plan:

## 2011-05-15 NOTE — Progress Notes (Signed)
Addended by: Damita Lack on: 05/15/2011 05:02 PM   Modules accepted: Orders

## 2011-05-15 NOTE — Patient Instructions (Signed)
I have sent in some Tessalon Perles for your cough. I am glad you're getting a little bit better.  I will send you a letter with your lab results. Please come back in 4 months to meet your new doctor.

## 2011-05-16 LAB — COMPREHENSIVE METABOLIC PANEL
ALT: 72 U/L — ABNORMAL HIGH (ref 0–53)
Albumin: 4.5 g/dL (ref 3.5–5.2)
CO2: 27 mEq/L (ref 19–32)
Calcium: 9.6 mg/dL (ref 8.4–10.5)
Chloride: 105 mEq/L (ref 96–112)
Creat: 0.83 mg/dL (ref 0.50–1.35)
Potassium: 4.1 mEq/L (ref 3.5–5.3)

## 2011-05-16 LAB — LIPID PANEL
Cholesterol: 171 mg/dL (ref 0–200)
LDL Cholesterol: 96 mg/dL (ref 0–99)
Triglycerides: 188 mg/dL — ABNORMAL HIGH (ref <150)

## 2011-05-19 ENCOUNTER — Telehealth: Payer: Self-pay | Admitting: Family Medicine

## 2011-05-19 NOTE — Telephone Encounter (Signed)
Pt has gone to bathroom with watery stools 6 times today.  Need a call back from Triage nurse.

## 2011-05-19 NOTE — Telephone Encounter (Signed)
Patient states he developed diarrhea  at 1:00 AM today.  Has had diarrhea, watery stools x 6 and feels urge to go again now.  Denies vomiting. Advised no greasy or fried foods. Leave off milk for now. Advised to push fluids. Call back tomorrow if continues. Consulted with Dr. [place] also.

## 2011-05-21 ENCOUNTER — Ambulatory Visit (INDEPENDENT_AMBULATORY_CARE_PROVIDER_SITE_OTHER): Payer: 59 | Admitting: Psychiatry

## 2011-05-21 DIAGNOSIS — F209 Schizophrenia, unspecified: Secondary | ICD-10-CM

## 2011-05-22 ENCOUNTER — Telehealth: Payer: Self-pay | Admitting: Family Medicine

## 2011-05-22 NOTE — Telephone Encounter (Signed)
Spoke with patient and informed him of stable labs. He expressed understanding

## 2011-05-22 NOTE — Telephone Encounter (Signed)
Would like to know results of labs from last week.

## 2011-05-28 ENCOUNTER — Ambulatory Visit (INDEPENDENT_AMBULATORY_CARE_PROVIDER_SITE_OTHER): Payer: 59 | Admitting: Psychiatry

## 2011-05-28 DIAGNOSIS — F209 Schizophrenia, unspecified: Secondary | ICD-10-CM

## 2011-06-04 ENCOUNTER — Ambulatory Visit (INDEPENDENT_AMBULATORY_CARE_PROVIDER_SITE_OTHER): Payer: 59 | Admitting: Psychiatry

## 2011-06-04 DIAGNOSIS — F209 Schizophrenia, unspecified: Secondary | ICD-10-CM

## 2011-06-11 ENCOUNTER — Ambulatory Visit: Payer: 59 | Admitting: Psychiatry

## 2011-06-18 ENCOUNTER — Ambulatory Visit (INDEPENDENT_AMBULATORY_CARE_PROVIDER_SITE_OTHER): Payer: 59 | Admitting: Psychiatry

## 2011-06-18 DIAGNOSIS — F209 Schizophrenia, unspecified: Secondary | ICD-10-CM

## 2011-06-25 ENCOUNTER — Ambulatory Visit: Payer: 59 | Admitting: Psychiatry

## 2011-06-27 ENCOUNTER — Other Ambulatory Visit: Payer: Self-pay | Admitting: Family Medicine

## 2011-06-27 MED ORDER — GABAPENTIN 600 MG PO TABS: 1200.0000 mg | ORAL_TABLET | Freq: Three times a day (TID) | ORAL | Status: DC

## 2011-06-30 ENCOUNTER — Encounter: Payer: Self-pay | Admitting: Emergency Medicine

## 2011-06-30 ENCOUNTER — Encounter: Payer: Self-pay | Admitting: Internal Medicine

## 2011-06-30 ENCOUNTER — Ambulatory Visit (INDEPENDENT_AMBULATORY_CARE_PROVIDER_SITE_OTHER): Payer: Medicare Other | Admitting: Emergency Medicine

## 2011-06-30 ENCOUNTER — Telehealth: Payer: Self-pay | Admitting: *Deleted

## 2011-06-30 VITALS — BP 128/90 | HR 71 | Temp 98.1°F | Ht 72.0 in | Wt 346.8 lb

## 2011-06-30 DIAGNOSIS — R059 Cough, unspecified: Secondary | ICD-10-CM

## 2011-06-30 DIAGNOSIS — R05 Cough: Secondary | ICD-10-CM

## 2011-06-30 NOTE — Assessment & Plan Note (Signed)
This was his CC today, but actually seems to be getting better.  - could add fluticasone 2 sprays bid to his loratadine at least thru the allergy season, continue the omeprazole - not clear whether he needs the albuterol, he thinks he has dx asthma, may have had PFT at [facility], will confirm. If none then will order

## 2011-06-30 NOTE — Telephone Encounter (Signed)
Pt is scheduled to see RB on June 21 - lmomtcb to schedule PFT prior to OV.  Order has been placed for this.

## 2011-06-30 NOTE — Progress Notes (Signed)
Subjective:    Patient ID: Lance Luna, male    DOB: [birth year], 41 y.o.   MRN: [id number]  HPI 41 yo man, hx of HTN, OSA on CPAP, allergic rhinitis, mild MR.  He was well until Feb '13 when he sounds like he caught URI. Most sx resolved but he has been left w persistent dry cough. Has been rx with cough suppression, tessalon pearls. Also was on omeprazole and loratadine already. Fluticasone nasal spray added in Feb'13, has been using prn. Having some occasional L chest wall pain.  He believes he has had spirometry before (? At [facility]), ? Full PFT.   CXR 05/09/11 -- clear    Review of Systems  Constitutional: Negative for fever and unexpected weight change.  HENT: Positive for congestion, rhinorrhea, sneezing and postnasal drip. Negative for ear pain, nosebleeds, sore throat, trouble swallowing, dental problem and sinus pressure.   Eyes: Negative for redness and itching.  Respiratory: Positive for cough, chest tightness, shortness of breath and wheezing.   Cardiovascular: Negative for palpitations and leg swelling.  Gastrointestinal: Negative for nausea and vomiting.  Genitourinary: Negative for dysuria.  Musculoskeletal: Negative for joint swelling.  Skin: Positive for rash.  Neurological: Positive for headaches.  Hematological: Does not bruise/bleed easily.  Psychiatric/Behavioral: Positive for dysphoric mood. The patient is nervous/anxious.     Past Medical History  Diagnosis Date  . H. pylori infection 10/2001  . Carpal tunnel syndrome of left wrist   . Muscle strain     Multiple  . Mental retardation   . Hypertension   . Chronic headache   . Obstructive sleep apnea      Family History  Problem Relation Age of Onset  . Migraines Mother      History   Social History  . Marital Status: Single    Spouse Name: N/A    Number of Children: N/A  . Years of Education: N/A   Occupational History  . Not on file.   Social History Main Topics  . Smoking status: Never Smoker    . Smokeless tobacco: Never Used  . Alcohol Use: No  . Drug Use: No  . Sexually Active: Not Currently   Other Topics Concern  . Not on file   Social History Narrative   frequent visits to ED for various sx`s, Pain Management - Dr. Laneta Simmers - prescribes Neurontin.Dr. Cardell Peach at [facility] - she prescribes Prozac and Trazadone.Neurologist - Dr. Wynetta Emery on disability for ruptured disk in back; no etoh; non smoker; no drug use; one cup of coffee daily;; NCFunctional Assessment - Moderate; Lives in [place] (?) apartments per [employer] (org that helps people w/learning disabilities)Wears diaper because sometimes has accidents. Lives alone. Family lives in [place] and parents won't talk to him. Spends time with the Jones family - his godparents - POA. [phone]. 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     No Known Allergies   Outpatient Prescriptions Prior to Visit  Medication Sig Dispense Refill  . albuterol (PROAIR HFA) 108 (90 BASE) MCG/ACT inhaler Inhale 1 puff into the lungs every 4 (four) hours as needed.        . ARIPiprazole (ABILIFY) 10 MG tablet Take 1 tablet (10 mg total) by mouth daily.  30 tablet  11  . calcipotriene (DOVONOX) 0.005 % ointment Apply topically 2 (two) times daily. Avoid face.       . cyclobenzaprine (FLEXERIL) 5 MG  tablet Take 2 tablets (10 mg total) by mouth 3 (three) times daily as needed for muscle spasms.  30 tablet  1  . FLUoxetine (PROZAC) 40 MG capsule Take 40 mg by mouth daily.       Marland Kitchen gabapentin (NEURONTIN) 600 MG tablet Take 2 tablets (1,200 mg total) by mouth 3 (three) times daily.  180 tablet  11  . hydrochlorothiazide (HYDRODIURIL) 25 MG tablet Take 1 tablet (25 mg total) by mouth daily.  30 tablet  11  . loratadine (CLARITIN) 10 MG tablet Take 1 tablet (10 mg total) by mouth daily.  30 tablet  11  . omeprazole (PRILOSEC) 40 MG capsule Take 1 capsule (40 mg total) by  mouth daily.  30 capsule  11  . traZODone (DESYREL) 100 MG tablet Take 200 mg by mouth at bedtime. prescribed by Brock Bad, psych       . fluticasone (FLONASE) 50 MCG/ACT nasal spray Place 2 sprays into the nose daily.  16 g  6  . lidocaine (LIDODERM) 5 % Place 1 patch onto the skin every 12 (twelve) hours. Remove & Discard patch within 12 hours or as directed by MD  30 patch  2  . benzonatate (TESSALON PERLES) 100 MG capsule Take 1 capsule (100 mg total) by mouth every 6 (six) hours as needed for cough.  45 capsule  1  . Elastic Bandages & Supports (ACE WRIST BRACE) MISC Please provide left and right wrist brace for bilateral hand numbness that occurs every night. Has hx of carpal tunnel.            Objective:   Physical Exam Filed Vitals:   06/30/11 1522  BP: 128/90  Pulse: 71  Temp: 98.1 F (36.7 C)   Gen: Pleasant, obese, in no distress,  normal affect, some simple responses but well-oriented  ENT: No lesions,  mouth clear,  oropharynx clear, no postnasal drip  Neck: No JVD, no TMG, no carotid bruits  Lungs: No use of accessory muscles, no dullness to percussion, clear without rales or rhonchi  Cardiovascular: RRR, heart sounds normal, no murmur or gallops, no peripheral edema  Musculoskeletal: No deformities, no cyanosis or clubbing  Neuro: alert, non focal  Skin: Warm, no lesions or rashes     Assessment & Plan:

## 2011-06-30 NOTE — Patient Instructions (Signed)
Continue your loratadine and omeprazole every day Start using your fluticasone nasal spray, 2 sprays each nostril twice a day.  We will review your breathing tests Follow with Dr Delton Coombes in 1 month

## 2011-07-01 NOTE — Telephone Encounter (Signed)
Called, spoke with pt.  Informed him [facility] does not have records of him having PFTs done there in the past.  Advised RB would like him to have PFT and OV same day in 1 month.  We have scheduled this for June 21 - PFT at 3pm and OV at 4:15 pm -- pt aware.

## 2011-07-02 ENCOUNTER — Ambulatory Visit (INDEPENDENT_AMBULATORY_CARE_PROVIDER_SITE_OTHER): Payer: 59 | Admitting: Psychiatry

## 2011-07-02 DIAGNOSIS — F209 Schizophrenia, unspecified: Secondary | ICD-10-CM

## 2011-07-09 ENCOUNTER — Ambulatory Visit (INDEPENDENT_AMBULATORY_CARE_PROVIDER_SITE_OTHER): Payer: 59 | Admitting: Psychiatry

## 2011-07-09 DIAGNOSIS — F209 Schizophrenia, unspecified: Secondary | ICD-10-CM

## 2011-07-15 ENCOUNTER — Ambulatory Visit (INDEPENDENT_AMBULATORY_CARE_PROVIDER_SITE_OTHER): Payer: Medicare Other | Admitting: Family Medicine

## 2011-07-15 ENCOUNTER — Encounter: Payer: Self-pay | Admitting: Family Medicine

## 2011-07-15 VITALS — BP 133/82 | HR 80 | Temp 99.0°F | Ht 72.0 in | Wt 338.0 lb

## 2011-07-15 DIAGNOSIS — L748 Other eccrine sweat disorders: Secondary | ICD-10-CM | POA: Insufficient documentation

## 2011-07-15 NOTE — Assessment & Plan Note (Signed)
Will have patient seen by surgery for further evaluation. I think this is likely to be a blocked gland rather than a lymph node or a abscess. He has had a long course without fevers are otherwise systemic illness. I doubt this to be infectious

## 2011-07-15 NOTE — Progress Notes (Signed)
  Subjective:    Patient ID: Lance Luna, male    DOB: 1970/08/18, 41 y.o.   MRN: [id number]  HPI Paperwork-patient needs to be recertified for scat bus. Paperwork filled out today.  Fullness in right axilla-patient with tenderness and growth of induration in right axilla. No fevers, sweats, chills,  weight loss. this has been present over several months and is growing. No other masses, no other skin lesions that are new.  Review of Systems No N/V/D    Objective:   Physical Exam Vital signs reviewed General appearance - alert, well appearing, and in no distress Axilla-there is a 3 cm x 1.5 cm area of induration and tenderness. There is no overlying redness. There is no area of fluctuance.       Assessment & Plan:

## 2011-07-15 NOTE — Patient Instructions (Signed)
Today I sent in a referral for surgery for you We will call you with that appointment information

## 2011-07-16 ENCOUNTER — Ambulatory Visit (INDEPENDENT_AMBULATORY_CARE_PROVIDER_SITE_OTHER): Payer: 59 | Admitting: Psychiatry

## 2011-07-16 DIAGNOSIS — F209 Schizophrenia, unspecified: Secondary | ICD-10-CM

## 2011-07-23 ENCOUNTER — Ambulatory Visit (INDEPENDENT_AMBULATORY_CARE_PROVIDER_SITE_OTHER): Payer: 59 | Admitting: Psychiatry

## 2011-07-23 DIAGNOSIS — F209 Schizophrenia, unspecified: Secondary | ICD-10-CM

## 2011-07-30 ENCOUNTER — Ambulatory Visit (INDEPENDENT_AMBULATORY_CARE_PROVIDER_SITE_OTHER): Payer: 59 | Admitting: Psychiatry

## 2011-07-30 DIAGNOSIS — F209 Schizophrenia, unspecified: Secondary | ICD-10-CM

## 2011-08-05 ENCOUNTER — Encounter (INDEPENDENT_AMBULATORY_CARE_PROVIDER_SITE_OTHER): Payer: Self-pay | Admitting: General Surgery

## 2011-08-05 ENCOUNTER — Ambulatory Visit (INDEPENDENT_AMBULATORY_CARE_PROVIDER_SITE_OTHER): Payer: Medicare Other | Admitting: General Surgery

## 2011-08-05 VITALS — BP 150/96 | HR 80 | Temp 97.4°F | Resp 16 | Ht 72.0 in | Wt 336.4 lb

## 2011-08-05 DIAGNOSIS — R229 Localized swelling, mass and lump, unspecified: Secondary | ICD-10-CM

## 2011-08-05 DIAGNOSIS — R2230 Localized swelling, mass and lump, unspecified upper limb: Secondary | ICD-10-CM

## 2011-08-05 NOTE — Patient Instructions (Signed)
Please get ultrasound and mammogram.  Will determine follow up after imaging.

## 2011-08-05 NOTE — Assessment & Plan Note (Signed)
It is unclear what the origin of this mass is in the axilla. It is unlikely to be malignant based on the fact that it has been there for over 2-3 years. It is symptomatic in that it is slightly tender. A low artery and right mammogram and ultrasound to better evaluate the mass. I cannot tell it is definitely intradermal. However, it does not quite feel deep enough to be a lymph node. We will determine followup based on the results of imaging.

## 2011-08-05 NOTE — Progress Notes (Signed)
Chief Complaint  Patient presents with  . New Evaluation    New Pt. 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 Deirdre Priest to evaluate a right axillary mass. He does not recall and has been there but as it has been there for several years. It is slightly smaller than a few weeks ago but is larger than when he first noticed it. He does not recall any drainage from this region. He has not particularly noticed that it was red or acutely painful. It is somewhat sore when it gets bumped and does hurt on occasion at rest.  He denies fevers and chills. He denies trauma to the right arm. He has not ever noticed a mass in the right breast. He has no family history of breast cancer.  Past Medical History  Diagnosis Date  . H. pylori infection 10/2001  . Carpal tunnel syndrome of left wrist   . Muscle strain     Multiple  . Mental retardation   . Hypertension   . Chronic headache   . Obstructive sleep apnea     Past Surgical History  Procedure Date  . Cervical disc surgery 2003  . Esophagogastroduodenoscopy 2003    No ulcers  . Appendectomy 2002  . Lumbar laminectomy 12/2002    L3-4, L4-5  . Carpal tunnel release     Current Outpatient Prescriptions  Medication Sig Dispense Refill  . albuterol (PROAIR HFA) 108 (90 BASE) MCG/ACT inhaler Inhale 1 puff into the lungs every 4 (four) hours as needed.        . ARIPiprazole (ABILIFY) 10 MG tablet Take 1 tablet (10 mg total) by mouth daily.  30 tablet  11  . calcipotriene (DOVONOX) 0.005 % ointment Apply topically 2 (two) times daily. Avoid face.       . cyclobenzaprine (FLEXERIL) 5 MG tablet Take 2 tablets (10 mg total) by mouth 3 (three) times daily as needed for muscle spasms.  30 tablet  1  . FLUoxetine (PROZAC) 40 MG capsule Take 40 mg by mouth daily.       . fluticasone (FLONASE) 50 MCG/ACT nasal spray Place 2 sprays into the nose daily as needed.      . gabapentin (NEURONTIN) 600 MG tablet  Take 2 tablets (1,200 mg total) by mouth 3 (three) times daily.  180 tablet  11  . hydrochlorothiazide (HYDRODIURIL) 25 MG tablet Take 1 tablet (25 mg total) by mouth daily.  30 tablet  11  . ketorolac (TORADOL) 10 MG tablet Take 10 mg by mouth every 6 (six) hours as needed. For bad headaches      . lidocaine (LIDODERM) 5 % Place 1 patch onto the skin every 12 (twelve) hours. Remove & Discard patch within 12 hours or as directed by MD  30 patch  2  . loratadine (CLARITIN) 10 MG tablet Take 1 tablet (10 mg total) by mouth daily.  30 tablet  11  . omeprazole (PRILOSEC) 40 MG capsule Take 1 capsule (40 mg total) by mouth daily.  30 capsule  11  . traZODone (DESYREL) 100 MG tablet Take 200 mg by mouth at bedtime. prescribed by Brock Bad, psych       . DISCONTD: FLUoxetine (PROZAC) 40 MG capsule Take 1 capsule (40 mg total) by mouth every other day. One cap every other day for a week, then stop this medication.  30 capsule  0     No Known Allergies  Family History  Problem Relation Age of Onset  . Migraines Mother      History   Social History  . Marital Status: Single    Spouse Name: N/A    Number of Children: N/A  . Years of Education: N/A   Social History Main Topics  . Smoking status: Never Smoker   . Smokeless tobacco: Never Used  . Alcohol Use: No  . Drug Use: No  . Sexually Active: Not Currently   Other Topics Concern  . None   Social History Narrative   frequent visits to ED for various sx`s, Pain Management - Dr. Laneta Simmers - prescribes Neurontin.Dr. Cardell Peach at [facility] - she prescribes Prozac and Trazadone.Neurologist - Dr. Wynetta Emery on disability for ruptured disk in back; no etoh; non smoker; no drug use; one cup of coffee daily;; NCFunctional Assessment - Moderate; Lives in [place] (?) apartments per [employer] (org that helps people w/learning disabilities)Wears diaper because sometimes has accidents. Lives alone. Family lives in  [place] and parents won't talk to him. Spends time with the Jones family - his godparents - POA. [phone]. 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     REVIEW OF SYSTEMS - PERTINENT POSITIVES ONLY: 12 point review of systems negative other than HPI and PMH  EXAM: Filed Vitals:   08/05/11 1340  BP: 150/96  Pulse: 80  Temp: 97.4 F (36.3 C)  Resp: 16    Gen:  No acute distress.  Well nourished and well groomed.  Numerous pimples and acne over face and arms.  Neurological: Alert and oriented to person, place, and time. Coordination normal.  Head: Normocephalic and atraumatic.  Eyes: Conjunctivae are normal. Pupils are equal, round, and reactive to light. No scleral icterus.  Neck: Normal range of motion. Neck supple. No tracheal deviation or thyromegaly present.  Cardiovascular: Normal rate, regular rhythm, normal heart sounds and intact distal pulses.  Exam reveals no gallop and no friction rub.  No murmur heard. Respiratory: Effort normal.  No respiratory distress. No chest wall tenderness. Breath sounds normal.  No wheezes, rales or rhonchi.  GI: Soft. Bowel sounds are normal. The abdomen is soft and nontender.  There is no rebound and no guarding.  Musculoskeletal: Normal range of motion. Extremities are nontender.  Lymphadenopathy: No cervical, preauricular, postauricular adenopathy is present.  There is a 3-4 cm mass in the right axilla.   Skin: Skin is warm and dry. Diffuse acne noted.   No diaphoresis. No erythema. No pallor. No clubbing, cyanosis, or edema.   Psychiatric: Normal mood and affect. Behavior is normal. Judgment and thought content normal.    ASSESSMENT AND PLAN: Mass of axilla It is unclear what the origin of this mass is in the axilla. It is unlikely to be malignant based on the fact that it has been there for over 2-3 years. It is symptomatic in that it is slightly tender. A low artery and right  mammogram and ultrasound to better evaluate the mass. I cannot tell it is definitely intradermal. However, it does not quite feel deep enough to be a lymph node. We will determine followup based on the results of imaging.     Maudry Diego MD Surgical Oncology, General and Endocrine Surgery [facility]      Visit Diagnoses: 1. Axillary mass   2. Mass of axilla     Primary Care Physician: Ellery Plunk, MD

## 2011-08-06 ENCOUNTER — Ambulatory Visit (INDEPENDENT_AMBULATORY_CARE_PROVIDER_SITE_OTHER): Payer: 59 | Admitting: Psychiatry

## 2011-08-06 DIAGNOSIS — F209 Schizophrenia, unspecified: Secondary | ICD-10-CM

## 2011-08-08 ENCOUNTER — Encounter: Payer: Self-pay | Admitting: Emergency Medicine

## 2011-08-08 ENCOUNTER — Ambulatory Visit (INDEPENDENT_AMBULATORY_CARE_PROVIDER_SITE_OTHER): Payer: Medicare Other | Admitting: Emergency Medicine

## 2011-08-08 VITALS — BP 126/94 | HR 82 | Temp 98.1°F | Ht 72.0 in | Wt 334.0 lb

## 2011-08-08 DIAGNOSIS — R05 Cough: Secondary | ICD-10-CM

## 2011-08-08 DIAGNOSIS — R059 Cough, unspecified: Secondary | ICD-10-CM

## 2011-08-08 LAB — PULMONARY FUNCTION TEST

## 2011-08-08 NOTE — Assessment & Plan Note (Signed)
Your breathing tests do not show any definite evidence for asthma. We will not add any new breathing medications at this time.  Continue to have your albuterol available to use as needed.  Increase your omeprazole to 40mg  twice a day for the next 4 days, then go back to your usual dose of 40mg  daily Follow with gastroenterology as planned.  Follow with Dr Delton Coombes in 1 year

## 2011-08-08 NOTE — Progress Notes (Signed)
PFT done today. Lance Luna,CMA  

## 2011-08-08 NOTE — Patient Instructions (Addendum)
Your breathing tests do not show any definite evidence for asthma. We will not add any new breathing medications at this time.  Continue to have your albuterol available to use as needed.  Increase your omeprazole to 40mg twice a day for the next 4 days, then go back to your usual dose of 40mg daily Follow with gastroenterology as planned.  Follow with Dr Juanmanuel Marohl in 1 year 

## 2011-08-08 NOTE — Progress Notes (Signed)
  Subjective:    Patient ID: Lance Luna, male    DOB: 02-10-1971, 41 y.o.   MRN: [id number] HPI 41 yo man, hx of HTN, OSA on CPAP, allergic rhinitis, mild MR.  He was well until Feb '13 when he sounds like he caught URI. Most sx resolved but he has been left w persistent dry cough. Has been rx with cough suppression, tessalon pearls. Also was on omeprazole and loratadine already. Fluticasone nasal spray added in Feb'13, has been using prn. Having some occasional L chest wall pain.  He believes he has had spirometry before (? At [facility]), ? Full PFT.   CXR 05/09/11 -- clear   ROV 08/08/11 -- HTN, OSA on CPAP, allergic rhinitis, mild MR.  Reports that his cough is better but that he is having more L sided CP, can happen any time. Dr Sharyn Lull has evaluated > reassuring.  He has been using albuterol prn, about every few days.      Objective:   Physical Exam Filed Vitals:   08/08/11 1706  BP: 126/94  Pulse: 82  Temp: 98.1 F (36.7 C)   Gen: Pleasant, obese, in no distress,  normal affect, some simple responses but well-oriented  ENT: No lesions,  mouth clear,  oropharynx clear, no postnasal drip  Neck: No JVD, no TMG, no carotid bruits  Lungs: No use of accessory muscles, no dullness to percussion, clear without rales or rhonchi  Cardiovascular: RRR, heart sounds normal, no murmur or gallops, no peripheral edema  Musculoskeletal: No deformities, no cyanosis or clubbing  Neuro: alert, non focal  Skin: Warm, no lesions or rashes     Assessment & Plan:  Cough Your breathing tests do not show any definite evidence for asthma. We will not add any new breathing medications at this time.  Continue to have your albuterol available to use as needed.  Increase your omeprazole to 40mg  twice a day for the next 4 days, then go back to your usual dose of 40mg  daily Follow with gastroenterology as planned.  Follow with Dr Delton Coombes in 1 year

## 2011-08-12 ENCOUNTER — Ambulatory Visit
Admission: RE | Admit: 2011-08-12 | Discharge: 2011-08-12 | Disposition: A | Discharge status: Home / Self Care | Payer: Medicare Other | Source: Ambulatory Visit | Attending: General Surgery | Admitting: General Surgery

## 2011-08-12 ENCOUNTER — Ambulatory Visit
Admission: RE | Admit: 2011-08-12 | Discharge: 2011-08-12 | Disposition: A | Discharge status: Home / Self Care | Payer: 59 | Source: Ambulatory Visit | Attending: General Surgery | Admitting: General Surgery

## 2011-08-12 DIAGNOSIS — R2230 Localized swelling, mass and lump, unspecified upper limb: Secondary | ICD-10-CM

## 2011-08-13 ENCOUNTER — Ambulatory Visit (INDEPENDENT_AMBULATORY_CARE_PROVIDER_SITE_OTHER): Payer: 59 | Admitting: Psychiatry

## 2011-08-13 DIAGNOSIS — F209 Schizophrenia, unspecified: Secondary | ICD-10-CM

## 2011-08-18 ENCOUNTER — Telehealth: Payer: Self-pay | Admitting: Family Medicine

## 2011-08-18 DIAGNOSIS — R0789 Other chest pain: Secondary | ICD-10-CM

## 2011-08-18 MED ORDER — CYCLOBENZAPRINE HCL 5 MG PO TABS: 10.0000 mg | ORAL_TABLET | Freq: Three times a day (TID) | ORAL | Status: DC | PRN

## 2011-08-18 NOTE — Telephone Encounter (Signed)
Returned called to patient. Started having lower back muscle spasms on Friday night. Has had cortisone shots in the past, but this is a new type of pain. Moved some furniture about 4 days before, otherwise no injury.  Patient has an appointment to see me in 2 days. Will recommend he use heat, NSAIDs and Flexeril as needed for pain. If he has any changes including fevers, numbness/tingling in his legs, or worsening of pain, he should be seen before that. Will refill Flexeril for him.  Connie Hilgert M. Lamone Ferrelli, M.D.

## 2011-08-18 NOTE — Telephone Encounter (Signed)
Patient is calling to check on the time for his appt on Wednesday and to ask who was On Call because he had the On Call doctor paged, but they never called back.  He asked for his doctor to call him back because he had some severe muscle spasms over the weekend and he doesn't know what to do for them.

## 2011-08-20 ENCOUNTER — Ambulatory Visit (INDEPENDENT_AMBULATORY_CARE_PROVIDER_SITE_OTHER): Payer: Medicare Other | Admitting: Family Medicine

## 2011-08-20 ENCOUNTER — Encounter: Payer: Self-pay | Admitting: Family Medicine

## 2011-08-20 VITALS — BP 129/93 | HR 92 | Ht 72.0 in | Wt 330.0 lb

## 2011-08-20 DIAGNOSIS — Z5181 Encounter for therapeutic drug level monitoring: Secondary | ICD-10-CM

## 2011-08-20 DIAGNOSIS — S39012A Strain of muscle, fascia and tendon of lower back, initial encounter: Secondary | ICD-10-CM | POA: Insufficient documentation

## 2011-08-20 DIAGNOSIS — S335XXA Sprain of ligaments of lumbar spine, initial encounter: Secondary | ICD-10-CM

## 2011-08-20 DIAGNOSIS — L408 Other psoriasis: Secondary | ICD-10-CM

## 2011-08-20 DIAGNOSIS — E669 Obesity, unspecified: Secondary | ICD-10-CM

## 2011-08-20 NOTE — Assessment & Plan Note (Signed)
Patient not always using creams, therefore has increased excoriations. Encouraged him to use the creams and use lotion as needed. Right calf lesion will need to be followed. If he has increased redness or bleeding, he will return to be seen.

## 2011-08-20 NOTE — Patient Instructions (Signed)
It was nice to meet you today.  I am sorry your back is hurting so badly. Continue to take the Flexeril as needed for the pain. You can use a heating pad as needed as well. Sometimes muscle rubs really help (like BenGay or [place]), but please do not use the rub at the same time as the heat since it will burn your skin.  If you need anything, please let me know. I will see you back in October or November.   Alyjah Lovingood M. Saahas Hidrogo, M.D.

## 2011-08-20 NOTE — Progress Notes (Signed)
Subjective:     Patient ID: Lance Luna, male   DOB: February 23, 1970, 41 y.o.   MRN: [id number]  HPI Patient is here for a follow up today. He is concerned about his back pain  1. Back pain- Patient has a history of muscle strain. He also recently helped move furniture then had acute onset lower back pain a few days later. No fevers, no loss of bowel or bladder, no numbness of leg. He thinks it is a muscle strain. He has had back surgery before, but this pain does not feel like that. He has been talking Flexeril which helps. Also, lying down improves the pain. Walking/moving makes the pain worse.   2. Obesity- Patient is concerned about his weight. He would like a diet plan. Encourage him to exercise and keep up with what he eats and try to be mindful of his food decisions. We will talk more about weight loss at his next visit and set more specific goals  3. Medication review- Patient would like to discuss all of his medications and the providers that prescribe his medications since this is my first time meeting him as a new PCP. He is followed by many specialists including GI (next appt July 24), [facility] (recently seen), neurology (no appt scheduled), Mental Health Dr. Cardell Peach (next appt July 29), Opthalmology (July 5)  and a therapist every Wednesday. His Prozac, Trazadone and Abilify are prescribed by mental health. Otherwise, everything else is prescribed here. His only recent change is pulm took him off Albuterol. He overall feels his medications are appropriate.  4. Psoriasis- Patient with known history of psoriasis, which is getting a little worse. He has been scratching more than usual. No bleeding, no oozing of his patches. He does not use his creams consistently. He does not seem concerned about the changes.   History reviewed: Non-smoker.  Review of Systems Please see HPI above    Objective:   Physical Exam  Constitutional: He appears well-developed and well-nourished. No distress.   HENT:  Head: Normocephalic and atraumatic.  Neck: Normal range of motion.       Excessive fat on back of neck  Cardiovascular: Normal rate, regular rhythm and normal heart sounds.   No murmur heard. Pulmonary/Chest: Effort normal. No respiratory distress.  Abdominal: Soft. There is no tenderness.       Obese  Musculoskeletal: He exhibits no edema.       Tenderness of paraspinal lumbar muscles. No bony tenderness. ROM limited by pain and body habitus. Able to lift legs. No numbness.  Neurological: He is alert.  Skin: Skin is warm.       Multiple dry scaly patches of arms and legs. Right lateral thigh with 4cm patch with multiple excoriations and erythema. Not actively bleeding and no signs of infection.      Assessment:     41 yo M presenting for follow up appointment and muscle strain    Plan:

## 2011-08-20 NOTE — Assessment & Plan Note (Signed)
Patient interested in losing weight. Encouraged him to keep track of his food and be mindful. Will explore weight loss options with him at next visit. Will closely monitor weight.

## 2011-08-20 NOTE — Assessment & Plan Note (Signed)
Patient taking Flexeril, which helps. Encourage him to continue to use this. He will need to rest to allow his back to feel better. He can use heat as needed, or muscle rub, although not at the same time. If patient has fevers, worsening pain, difficulty moving, numbness or loss of bowel/bladder, he will return to be seen. Patient able to repeat instructions back to me verbally.

## 2011-08-20 NOTE — Assessment & Plan Note (Addendum)
Patient on multiple medications and followed by many providers. Mental health refilling psych medications, and he will follow up with them soon. Other medications stable. Continue all medications at this time. Will refill as needed. Labs are UTD. Will need repeat labs at next visit.

## 2011-08-25 ENCOUNTER — Encounter: Payer: Self-pay | Admitting: *Deleted

## 2011-08-27 ENCOUNTER — Ambulatory Visit (INDEPENDENT_AMBULATORY_CARE_PROVIDER_SITE_OTHER): Payer: 59 | Admitting: Psychiatry

## 2011-08-27 DIAGNOSIS — F209 Schizophrenia, unspecified: Secondary | ICD-10-CM

## 2011-09-08 ENCOUNTER — Encounter (INDEPENDENT_AMBULATORY_CARE_PROVIDER_SITE_OTHER): Payer: Medicare Other | Admitting: General Surgery

## 2011-09-09 ENCOUNTER — Encounter (INDEPENDENT_AMBULATORY_CARE_PROVIDER_SITE_OTHER): Payer: Self-pay | Admitting: General Surgery

## 2011-09-10 ENCOUNTER — Ambulatory Visit: Payer: Medicare Other | Admitting: Internal Medicine

## 2011-09-10 ENCOUNTER — Ambulatory Visit: Payer: 59 | Admitting: Psychiatry

## 2011-09-24 ENCOUNTER — Ambulatory Visit (INDEPENDENT_AMBULATORY_CARE_PROVIDER_SITE_OTHER): Payer: 59 | Admitting: Psychiatry

## 2011-09-24 DIAGNOSIS — F209 Schizophrenia, unspecified: Secondary | ICD-10-CM

## 2011-10-01 ENCOUNTER — Ambulatory Visit: Payer: Medicare Other | Admitting: Psychiatry

## 2011-10-01 ENCOUNTER — Ambulatory Visit (INDEPENDENT_AMBULATORY_CARE_PROVIDER_SITE_OTHER): Payer: Medicare Other | Admitting: Family Medicine

## 2011-10-01 ENCOUNTER — Encounter: Payer: Self-pay | Admitting: Family Medicine

## 2011-10-01 VITALS — BP 141/91 | HR 74 | Temp 98.3°F | Ht 72.0 in | Wt 327.0 lb

## 2011-10-01 DIAGNOSIS — R05 Cough: Secondary | ICD-10-CM

## 2011-10-01 DIAGNOSIS — R059 Cough, unspecified: Secondary | ICD-10-CM

## 2011-10-01 MED ORDER — LORATADINE 10 MG PO TABS: 10.0000 mg | ORAL_TABLET | Freq: Every day | ORAL | Status: DC

## 2011-10-01 NOTE — Assessment & Plan Note (Signed)
A: new cough x 5 days. Most likely allergic rhinitis and viral URI with Post Nasal Drip    P:  Worsening signs and symptoms discussed. Restart Claritin. Use robitussin prn for up to 5 days.  Follow up as needed for persistent, worsening cough, or appearance of new symptoms

## 2011-10-01 NOTE — Progress Notes (Signed)
Patient ID: Lance Luna, male   DOB: 1971-01-30, 41 y.o.   MRN: [id number] Subjective:     Lance Luna is a 41 y.o. male  hx of HTN, OSA on CPAP, allergic rhinitis, mild cognitive impairment. Who presents with 5 days of non productive cough. He states that the  cough strated on Saturday morning while at rest. He denies unusual  exposure of dust or pollen. The cough is associated with mild rhinorrhea. He denies fever, CP, SOB and reflux. He has taking anything OTC for the cough. He is prescribed Claritin for allergic rhinitis but has ran out. He was taking albuterol for a prolonged cough a few months ago. This was discontinued by Dr. Delton Coombes (pulmonology) following normal PFTs.   Review of Systems Pertinent items are noted in HPI.    Objective:    BP 141/91  Pulse 74  Temp 98.3 F (36.8 C) (Oral)  Ht 6' (1.829 m)  Wt 327 lb (148.326 kg)  BMI 44.35 kg/m2 General appearance: alert, cooperative and no distress Head: Normocephalic, without obvious abnormality, atraumatic Eyes: conjunctivae/corneas clear. PERRL, EOM's intact.  Ears: normal TM's and external ear canals both ears Nose: Nares normal. Septum midline. Mucosa normal. Mild drainage. No sinus tenderness. Throat: lips, mucosa, and tongue normal; teeth and gums normal Neck: no adenopathy, no carotid bruit, no JVD, supple, symmetrical, trachea midline and thyroid not enlarged, symmetric, no tenderness/mass/nodules Lungs: clear to auscultation bilaterally Heart: regular rate and rhythm, S1, S2 normal, no murmur, click, rub or gallop    Assessment:    Allergic Rhinitis and viral URI with Post Nasal Drip    Plan:    Worsening signs and symptoms discussed. Restart Claritin. Use robitussin prn for up to 5 days.  Follow up as needed for persistent, worsening cough, or appearance of new symptoms.

## 2011-10-01 NOTE — Patient Instructions (Addendum)
Lance Luna,  Thank you for coming in today. Your cough appears to be due to a mild virus as well as allergies.  1. Restart Claritin daily (refill sent to pharmacy).  2. Buy Robitussin P if cough continues after you have restarted Claritin. You can use this for up to 5 days.   Please call and come back if you develop fever, chest pain or shortness of breath.   Dr. Armen Pickup

## 2011-10-07 ENCOUNTER — Ambulatory Visit (INDEPENDENT_AMBULATORY_CARE_PROVIDER_SITE_OTHER): Payer: Medicare Other | Admitting: Family Medicine

## 2011-10-07 ENCOUNTER — Encounter: Payer: Self-pay | Admitting: Family Medicine

## 2011-10-07 VITALS — BP 132/88 | HR 97 | Temp 98.3°F | Ht 72.0 in | Wt 323.0 lb

## 2011-10-07 DIAGNOSIS — R05 Cough: Secondary | ICD-10-CM

## 2011-10-07 DIAGNOSIS — R059 Cough, unspecified: Secondary | ICD-10-CM

## 2011-10-07 DIAGNOSIS — L408 Other psoriasis: Secondary | ICD-10-CM

## 2011-10-07 MED ORDER — BENZONATATE 200 MG PO CAPS: 200.0000 mg | ORAL_CAPSULE | Freq: Three times a day (TID) | ORAL | Status: AC | PRN

## 2011-10-07 MED ORDER — HYDROXYZINE HCL 10 MG PO TABS: 10.0000 mg | ORAL_TABLET | Freq: Every evening | ORAL | Status: AC | PRN

## 2011-10-07 NOTE — Assessment & Plan Note (Signed)
Itching is his biggest complaint and worst at night. Will prescribe Atarax for him to take at night as needed for itching.

## 2011-10-07 NOTE — Patient Instructions (Signed)
I am sorry you are still coughing. For now, use your Albuterol inhaler every 4 hours as needed. I have also sent in a prescription for Tessalon Perles to use as needed for cough.  Your sore throat is likely from the same virus that caused your cough.  If you need a refill on your inhaler, please let me know.  Allysen Lazo M. Angeliah Wisdom, M.D.

## 2011-10-07 NOTE — Assessment & Plan Note (Signed)
Dry cough with no wheezing on exam. Will ask patient to restart his albuterol inhaler in the short term that he has the cough. Also, prescribed Tessalon perles as this helped in the past for his cough. Sore throat likely from viral infection or irritation from cough. Told to use drops or something for symptomatic relief. If he develops fevers, chills, productive cough or dyspnea he should return to clinic for evaluation. Can also consider cough syrup with codeine if it continues.

## 2011-10-07 NOTE — Progress Notes (Signed)
Subjective:     Patient ID: Lance Luna, male   DOB: [birth year], 41 y.o.   MRN: [id number]  HPI Pt here for follow up for cough. Started on Robitussin and Claritin last week. Cough is the same. Now has sore throat and nasal congestion, which is not new. Has a history of allergies. No fevers or chills. No other constitutional symptoms. Coughs up white sputum. Nothing makes cough better or worse. +sick contacts with URI and laryngitis recently. On review of chart, patient had a similar presentation after URI in February of this year and was seen by pulm. Eventually that cough resolved, but seems to be bothering him currently  Also, patient is requesting something for itching for his chronic psoriasis. He states when he is asleep or tired he scratches his hands and inner thigh without realizing it. He denies any signs of infection or new rash.  Smoking history reviewed: nonsmoker  Review of Systems Negative except as noted above    Objective:   Physical Exam  Constitutional: He appears well-developed and well-nourished. No distress.  Cardiovascular: Normal rate and regular rhythm.   No murmur heard. Pulmonary/Chest: Effort normal and breath sounds normal. He has no wheezes. He has no rales. He exhibits no tenderness.  Abdominal: Soft. There is no tenderness.       Obese  Lymphadenopathy:    He has no cervical adenopathy.  Skin:       Dry scaly patches on hands and thumb with excoriations      Assessment:     41 yo M follow up for cough    Plan:     See problem list

## 2011-10-08 ENCOUNTER — Ambulatory Visit (INDEPENDENT_AMBULATORY_CARE_PROVIDER_SITE_OTHER): Payer: 59 | Admitting: Psychiatry

## 2011-10-08 DIAGNOSIS — F209 Schizophrenia, unspecified: Secondary | ICD-10-CM

## 2011-10-13 ENCOUNTER — Other Ambulatory Visit: Payer: Self-pay | Admitting: *Deleted

## 2011-10-13 MED ORDER — KETOROLAC TROMETHAMINE 10 MG PO TABS: 10.0000 mg | ORAL_TABLET | Freq: Four times a day (QID) | ORAL | Status: DC | PRN

## 2011-10-15 ENCOUNTER — Ambulatory Visit (INDEPENDENT_AMBULATORY_CARE_PROVIDER_SITE_OTHER): Payer: 59 | Admitting: Psychiatry

## 2011-10-15 DIAGNOSIS — F209 Schizophrenia, unspecified: Secondary | ICD-10-CM

## 2011-10-22 ENCOUNTER — Ambulatory Visit (INDEPENDENT_AMBULATORY_CARE_PROVIDER_SITE_OTHER): Payer: 59 | Admitting: Psychiatry

## 2011-10-22 DIAGNOSIS — F209 Schizophrenia, unspecified: Secondary | ICD-10-CM

## 2011-10-23 ENCOUNTER — Telehealth: Payer: Self-pay | Admitting: Internal Medicine

## 2011-10-23 NOTE — Telephone Encounter (Signed)
Message copied by Arna Snipe on Thu Oct 23, 2011  8:32 AM ------      Message from: Richardson Chiquito      Created: Wed Sep 10, 2011  8:47 AM       Per Dr Juanda Chance, do NOT charge late cancellation fee for patient cancellation 09/10/11.

## 2011-10-27 ENCOUNTER — Ambulatory Visit (INDEPENDENT_AMBULATORY_CARE_PROVIDER_SITE_OTHER): Payer: Medicare Other | Admitting: Family Medicine

## 2011-10-27 ENCOUNTER — Encounter: Payer: Self-pay | Admitting: Family Medicine

## 2011-10-27 VITALS — BP 136/80 | HR 71 | Temp 97.9°F | Ht 72.0 in | Wt 324.0 lb

## 2011-10-27 DIAGNOSIS — Z711 Person with feared health complaint in whom no diagnosis is made: Secondary | ICD-10-CM

## 2011-10-27 DIAGNOSIS — E669 Obesity, unspecified: Secondary | ICD-10-CM

## 2011-10-27 NOTE — Assessment & Plan Note (Signed)
Patient has had a 20lb weight loss since January, so he is heading in the right direction. Will refer to Dr. Gerilyn Pilgrim for nutrition counseling. I feel like he his motivated but may require a lot of encouragement. Pt is also on Abilify which could be contributing to his weight gain. I will keep this in mind. For now, [facility] and being any type of exercise. I have also given him a handout on portion sizes and smart eating habits. RTC in 1 month for follow up.

## 2011-10-27 NOTE — Assessment & Plan Note (Signed)
Lance Luna is concerned about abnormal semen. I did not go into further detail about his concern today. I do not know if this would be beneficial to further explore, but if he brings it up again, I will ask for a chaperone in the room and/or referring to a male provider for work up. If a semen analysis is warranted after further history is taken, a referral to Urology may be beneficial.

## 2011-10-27 NOTE — Progress Notes (Signed)
Subjective:     Patient ID: Lance Luna, male   DOB: Jun 12, 1970, 41 y.o.   MRN: [id number]  HPI Patient is a 41 yo M presenting to clinic to discuss weight management. He would also like to talk about a semen test.  1. Weight- Patient's concern is he "weighs too much." Trying to lose it for a long time. Goes walking but states he is unsure if he is eating the right foods. On a very brief review of his meals in the last 24 hours: Breakfast- Bowl of fruity peebles with skim milk. Lunch- Boneless honey BBQ chicken from home Dinner- 2 pork chops on the [employer] He has not had any fruits or vegetables. I did not ask about what he had to drink (I simply overlooked this detail) but I do think this is important. As far as exercise, he states he goes up and down steps 2 flights x3 day. Walks a few hours once per week with his mentor at [employer]. Has a free [employer] membership. Has a way to get to the [facility], but he does not go. Patient is very interested in talking to a nutritionist about how to eat the proper foods. He seems to have a good attitude. Denies abdominal pain, difficulty voiding or stooling. Denies any unintentional weight loss or gain.  2. Patient also requested a semen test. He states he would like to know if "everything works." He states he was told in the past that he was not eligible for this test. I did not go too deep into why he was requesting this today, but did state this would most likely be done at a different office rather than here. States he is not trying to have children. He also does not "have anything wrong."  History reviewed: Nonsmoker. Does not want flu shot today  Review of Systems See HPI above    Objective:   Physical Exam General: NAD, pleasant HEENT: AT, [place] Heart: RRR Lungs: CTAB Abd: Obese, soft, +BS. No masses or tenderness Ext: Moves all extremities     Assessment:     41 yo M presenting for weight loss    Plan:     See problem list

## 2011-10-27 NOTE — Patient Instructions (Signed)
It was good to see you today. I am very happy you are determined to lose weight! I know you can do this! I am here to support you along the way. I am going to ask you to call Dr. Gerilyn Pilgrim our dietitian to arrange an appointment with her.  I am including some diet information for you as well.  Please come back towards the end of October for a check up and to get your flu shot. Let me know if you need anything else!  Verlena Marlette M. Jossilyn Benda, M.D.  Serving Sizes What we call a serving size today is larger than it was in the past. A 1950s fast-food burger contained little more than 1 oz of meat, and a soft drink was 8 oz (1 cup). Today, a "quarter pounder" burger is at least 4 times that amount, and a 32 or 64 oz drink is not uncommon. A possible guide for eating when trying to lose weight is to eat about half as much as you normally do. Some estimates of serving sizes are:  1 Dairy serving:Individual container of yogurt (8 oz) or piece of cheese the size of your thumb (1 oz).   1 Grain serving: 1 slice of bread or  cup pasta.   1 Meat serving: The size of a deck of cards (3 oz).   1 Fruit serving: cup canned fruit or 1 medium fruit.   1 Vegetable serving:  cup of cooked or canned vegetables.   1 Fat serving:The size of 4 stacked dimes.  Experts suggest spending 1 or 2 days measuring food portions you commonly eat. This will give you better practice at estimating serving sizes, and will also show whether you are eating an appropriate amount of food to meet your weight goals. If you find that you are eating more than you thought, try measuring your food for a few days so you can "reprogram" yourself to learn what makes a healthy portion for you. SUGGESTIONS FOR CONTROL  In restaurants, share entrees, or ask the waiter to put half the entre in a box or bag before you even touch it.   Order lunch-sized portions. Many restaurants serve 4 to 6 oz of meat at lunch, compared with 8 to 10 oz at dinner.     Split dessert or skip it all together. Have a piece of fruit when you get home.   At home, use smaller plates and bowls. It will look as if you are eating more.   Plate your food in the kitchen rather than serving it "family style" at the table.   Wait 20 to 30 minutes before taking seconds. This is how long it takes your brain to recognize that you are full.   Check food labels for serving sizes. Eat 1 serving only.   Use measuring cups and spoons to see proper serving sizes.   Buy smaller packages of candy, popcorn, and snacks.   Avoid eating directly out of the bag or carton.   While eating half as much, exercise twice as much. Park further away from the mall, take the stairs instead of the escalator, and walk around your block.  Losing weight is a slow, difficult process. It takes long-lasting lifestyle changes. You can make gradual changes over time so they become habits. Look to friends and family to support the healthy changes you are making. Avoid fad diets since they are often only temporary weight loss solutions. Document Released: 11/02/2002 Document Revised: 01/23/2011 Document Reviewed: 12/12/2008 [employer]  Patient Information 2012 [employer]. 

## 2011-10-29 ENCOUNTER — Ambulatory Visit (INDEPENDENT_AMBULATORY_CARE_PROVIDER_SITE_OTHER): Payer: 59 | Admitting: Psychiatry

## 2011-10-29 DIAGNOSIS — F209 Schizophrenia, unspecified: Secondary | ICD-10-CM

## 2011-11-12 ENCOUNTER — Ambulatory Visit (INDEPENDENT_AMBULATORY_CARE_PROVIDER_SITE_OTHER): Payer: 59 | Admitting: Psychiatry

## 2011-11-12 DIAGNOSIS — F209 Schizophrenia, unspecified: Secondary | ICD-10-CM

## 2011-11-21 ENCOUNTER — Telehealth: Payer: Self-pay | Admitting: Family Medicine

## 2011-11-21 NOTE — Telephone Encounter (Signed)
Has taken Advil today without relief.  Wants to know if we can call in meds to help with pinched nerve.  Informed patient he will need office visit for eval.  Patient has an appt for 11/24/11 with Dr. Mikel Cella.  No appts available this afternoon.  Informed patient he may go to urgent care or ED to be evaluated.  Patient verbalized understanding and will go to urgent care.  Gaylene Brooks, RN

## 2011-11-21 NOTE — Telephone Encounter (Signed)
Has a pinched nerve and is hurting in his chest - wants to know what to do

## 2011-11-24 ENCOUNTER — Ambulatory Visit (HOSPITAL_COMMUNITY)
Admission: RE | Admit: 2011-11-24 | Discharge: 2011-11-24 | Disposition: A | Discharge status: Home / Self Care | Payer: Medicare Other | Source: Ambulatory Visit | Attending: Family Medicine | Admitting: Family Medicine

## 2011-11-24 ENCOUNTER — Encounter: Payer: Self-pay | Admitting: Family Medicine

## 2011-11-24 ENCOUNTER — Ambulatory Visit (INDEPENDENT_AMBULATORY_CARE_PROVIDER_SITE_OTHER): Payer: Medicare Other | Admitting: Family Medicine

## 2011-11-24 VITALS — BP 145/88 | HR 69 | Temp 98.1°F | Ht 72.0 in | Wt 329.0 lb

## 2011-11-24 DIAGNOSIS — R0789 Other chest pain: Secondary | ICD-10-CM | POA: Insufficient documentation

## 2011-11-24 MED ORDER — TRAMADOL HCL 50 MG PO TABS: 50.0000 mg | ORAL_TABLET | Freq: Three times a day (TID) | ORAL | Status: DC | PRN

## 2011-11-24 NOTE — Patient Instructions (Signed)
It was good to see you today. Your EKG looked good. I think this is likely a muscle strain. I have given you a new prescription for Tramadol to take up to three times per day for pain.   IF you have shortness of breath, worsening pain, your pain radiates, you have nausea or overall feel worse, please go to the emergency department for evaluation.  Please come back to see me in 2 weeks, or sooner if needed.   Adrinne Sze M. Nahdia Doucet, M.D.

## 2011-11-24 NOTE — Assessment & Plan Note (Signed)
Does not appear to be cardiac. EKG performed in office today is normal. Will treat as MSK pain with Tramadol prn. Patient encouraged to seek immediate attention if his pain worsens, radiates, has N/V, diaphoresis or dyspnea. He agrees.

## 2011-11-24 NOTE — Progress Notes (Signed)
Patient ID: Lance Luna, male   DOB: Mar 15, 1970, 41 y.o.   MRN: [id number] [name] Amber M. Hairford, MD Phone: [phone]   Subjective: HPI: Patient is a 42 y.o. male presenting to clinic today for evaluation of "pinched nerve" in his chest.  Patient states this has happened before, many years ago and was told it was a nerve. Started 4 days ago, has not changed since then. Taken Advil and muscle relaxer, didn't ease it off at all. Runs "up and down" left side of sternum. Nothing makes it better, worse with movement. No nausea, vomiting, no sweating, no syncope. Hurts more with palpation. No changes with eating. Has not been evaluated currently, but in the past when he had this same pain he reports he had a normal cardiac work up including heart cath.   History Reviewed: Non smoker. Health Maintenance: Needs flu shot  ROS: Please see HPI above.  Objective: Office vital signs reviewed.  Physical Examination:  General: Lying in bed. Awake, alert. Appears uncomfortable HEENT: Atraumatic, normocephalic Neck: No masses palpated. No LAD. No bruits Pulm: CTAB, no wheezes Cardio: RRR, no murmurs appreciated. TTP 3-5 intercostal space in mid-clavicular line Abdomen:+BS, soft, nontender, nondistended Extremities: No edema Neuro: Grossly intact  Assessment: 41 yo M with msk chest pain  Plan: See Problem List and After Visit Summary

## 2011-11-27 ENCOUNTER — Ambulatory Visit (INDEPENDENT_AMBULATORY_CARE_PROVIDER_SITE_OTHER): Payer: 59 | Admitting: Psychiatry

## 2011-11-27 DIAGNOSIS — F209 Schizophrenia, unspecified: Secondary | ICD-10-CM

## 2011-12-02 ENCOUNTER — Ambulatory Visit (INDEPENDENT_AMBULATORY_CARE_PROVIDER_SITE_OTHER): Payer: Medicare Other | Admitting: Family Medicine

## 2011-12-02 VITALS — Ht 72.0 in | Wt 327.6 lb

## 2011-12-02 DIAGNOSIS — E785 Hyperlipidemia, unspecified: Secondary | ICD-10-CM

## 2011-12-02 DIAGNOSIS — I10 Essential (primary) hypertension: Secondary | ICD-10-CM

## 2011-12-02 DIAGNOSIS — E669 Obesity, unspecified: Secondary | ICD-10-CM

## 2011-12-02 NOTE — Progress Notes (Signed)
Medical Nutrition Therapy:  Appt start time: 1000 end time:  1100.  Assessment:  Primary concerns today: Weight management and metabolic syndrome.  Lance Luna's usual eating pattern includes 3 meals and 3-4 snacks per day.  He usually gets up in the middle of the night for a snack such as Cheerios or peanut butter.   Usual physical activity includes walking 5-10 min around the [facility] track or at [facility] 1 X wk, and he takes his dog out for 5-10 min 3 X day.  Everyday foods include tea w/ 9-12 c Sweet 'n Low, 24 oz Diet Coke, 6 c coffee w/ milk & Sweet 'n Low.  Avoided foods include watermelon, pickles, onions, squash (disliked).  Lance Luna eats breakfast out about once a week, and he eats other meals out a couple times a week, including the buffet at [employer], and all-the-pasta-you-want at [employer].  Usually eats fruit & veg's each about 1 X wk.    Nutrition knowledge is very limited, evident when we discussed what lunch he could design using template of veg, pro, starch, using foods he has at home now.  Lance Luna shops for food once a month.  Financial constraints often dictate what he can buy.    (Up at ~6 AM)  24-hr recall:  B (9 AM)-   [name] gravy biscuit, 3-4 c coffee w/ milk & Sweet 'n Low (4 pks per cup) Snk (11 AM)-   3 small pkgs milkduds L ( PM)-  none Snk (4 PM)-  [employer] box mix (350 kcal), 2 c tea (25 Sweet 'n Low pkts per gallon) D (9 PM)-  1 baked chx breast, 1 fried chx breast, strawberry shortcake w/ whipped cream, Diet Coke Snk ( PM)-  none Yesterday was atypical in that he did not eat lunch, and dinner was at church.    Progress Towards Goal(s):  In progress.   Nutritional Diagnosis:  NB-1.7 Undesireable food choices As related to imbalanced diet.  As evidenced by usual intake of fruits and veg's only once a week. NB-2.1 Physical inactivity As related to intake.  As evidenced by usual physical activity limited to walking 5-10 min a couple times a week.   Intervention:  Nutrition education.  Monitoring/Evaluation:  Dietary intake, exercise, and body weight in 3 weeks.

## 2011-12-02 NOTE — Patient Instructions (Addendum)
-   Include at least one serving of vegetables and one serving of fruit each day.    - Even better would be vegetables at BOTH lunch and dinner.   - LUNCH and SUPPER:  - Include at least these three components:  - Vegetables (salad, raw carrots, tomatoes, cooked vegetables)   - Fresh or Frozen are best.  (Canned veg's have a lot of salt.)   - Use your microwave for cooking veg's for convenience.     - Protein (meat, fish, poultry, peanut butter, eggs, dairy foods, dried beans such as pintos)  - Starch (bread, rice, potatoes, corn, pasta, crackers, chips, muffins, all baked products, cereal)  - For example a lunch that includes each of the above might be:   - Green beans, fish sticks, and crackers.   - THINK AHEAD, AND PLAN WHAT YOU WILL HAVE FOR MEALS.    - Write down your meal plan for the week each Thursday when you see your case worker.    - Toward the end of the month, make a shopping list that includes some frozen vegetables and raw carrots.   - At restaurants, choose wisely:    1. Include a vegetable whenever possible.    2. Limit fried foods and fatty foods.  This means go easy on gravy and other condiments like dressing, mayonnaise, and butter.    3. Limit all-you-can-eat meals to once a month (or less often).   - If possible, go with your friend to his nutrition classes at the [place] in [place].   - Exercise goal:  Walk at least 30 min 5 times a week.    - Write down on your calendar how many minutes you walk each time.  Write the total amount for the week on each Saturday, and BRING YOUR WEEKLY TOTALS to your next appt.

## 2011-12-04 ENCOUNTER — Ambulatory Visit (INDEPENDENT_AMBULATORY_CARE_PROVIDER_SITE_OTHER): Payer: 59 | Admitting: Psychiatry

## 2011-12-04 DIAGNOSIS — F209 Schizophrenia, unspecified: Secondary | ICD-10-CM

## 2011-12-10 ENCOUNTER — Ambulatory Visit (INDEPENDENT_AMBULATORY_CARE_PROVIDER_SITE_OTHER): Payer: Medicare Other | Admitting: Family Medicine

## 2011-12-10 VITALS — BP 144/92 | HR 67 | Temp 98.0°F | Ht 72.0 in | Wt 325.0 lb

## 2011-12-10 DIAGNOSIS — I10 Essential (primary) hypertension: Secondary | ICD-10-CM

## 2011-12-10 DIAGNOSIS — G43909 Migraine, unspecified, not intractable, without status migrainosus: Secondary | ICD-10-CM

## 2011-12-10 DIAGNOSIS — R0789 Other chest pain: Secondary | ICD-10-CM

## 2011-12-10 DIAGNOSIS — Z23 Encounter for immunization: Secondary | ICD-10-CM

## 2011-12-10 MED ORDER — KETOROLAC TROMETHAMINE 10 MG PO TABS: 10.0000 mg | ORAL_TABLET | Freq: Four times a day (QID) | ORAL | Status: DC | PRN

## 2011-12-10 MED ORDER — CYCLOBENZAPRINE HCL 5 MG PO TABS: 10.0000 mg | ORAL_TABLET | Freq: Three times a day (TID) | ORAL | Status: DC | PRN

## 2011-12-10 MED ORDER — GABAPENTIN 600 MG PO TABS: 1200.0000 mg | ORAL_TABLET | Freq: Three times a day (TID) | ORAL | Status: DC

## 2011-12-10 MED ORDER — LISINOPRIL-HYDROCHLOROTHIAZIDE 20-25 MG PO TABS: 1.0000 | ORAL_TABLET | Freq: Every day | ORAL | Status: DC

## 2011-12-10 NOTE — Assessment & Plan Note (Signed)
BP elevated for last 2 visit. No signs of end organ damage. Takes HCTZ daily. Will add Lisinopril 20mg  to regimen. RTC in 4-6 weeks for blood work and follow up of BP.

## 2011-12-10 NOTE — Patient Instructions (Signed)
It was good to see you today.  Continue taking the Tramadol and Flexeril for your chest pain. You can also use a heating pad if you need to.  I have sent in a new blood pressure medication. Please come back in 6 weeks for a follow up.  Take care! Kennley Schwandt M. Vilas Edgerly, M.D.

## 2011-12-10 NOTE — Assessment & Plan Note (Signed)
Appt scheduled with neurology. Patient states he has been to ED for HA before and will return if needed.

## 2011-12-10 NOTE — Progress Notes (Signed)
Patient ID: Lance Luna, male   DOB: 08/26/1970, 41 y.o.   MRN: [id number] [facility] Arian Mcquitty M. Emiliana Blaize, MD Phone: [phone]  Subjective: HPI: Patient is a 41 y.o. male presenting to clinic today for follow up appointment. Concerns today include chest pain, hypertension, headache  1. Chest pain- Not much better. Taking Tramadol, which helps some. Does not take Flexeril or Toradol because he is out of it. He states the pain has not changed. Does not get worse with movement. Present all the time. Left sided, mid-clavicular line. No times when it is gone completely   2. HTN- Does not check BP while at home. Takes HCTZ for BP, does not miss any doses. No blurred vision, only CP as describe above but no pain with exertion, no leg swelling. Has had some elevated BP in the past.   3. Headaches- Pt states he has a steel plate in his neck that causes left sided headaches. Last HA was 5 days ago. He has an appointment in December with Neurosurgeon. HA controlled with muscle relaxer and Tylenol. Some nausea. Comes and goes, and not currently having a HA   History Reviewed: Non smoker. Health Maintenance: Needs flu shot today  ROS: Please see HPI above.  Objective: Office vital signs reviewed.  Physical Examination:  General: Awake, alert. NAD. Lying flat on table. Morbidly obese male HEENT: Atraumatic, normocephalic Pulm: CTAB, no wheezes Cardio: RRR, no murmurs appreciated Abdomen:+BS, soft, nontender, nondistended Extremities: No edema Neuro: Grossly intact  Assessment: 41 yo M follow up appointment  Plan: See Problem List and After Visit Summary

## 2011-12-10 NOTE — Assessment & Plan Note (Signed)
Patient with persistent MSK chest pain. Will continue Tramadol. Refill Flexeril and Toradol as well. Continue to use heat. Does not appear cardiac, but red flag symptoms given.

## 2011-12-11 ENCOUNTER — Ambulatory Visit: Payer: Self-pay | Admitting: Psychiatry

## 2011-12-18 ENCOUNTER — Ambulatory Visit (INDEPENDENT_AMBULATORY_CARE_PROVIDER_SITE_OTHER): Payer: 59 | Admitting: Psychiatry

## 2011-12-18 DIAGNOSIS — F209 Schizophrenia, unspecified: Secondary | ICD-10-CM

## 2011-12-22 ENCOUNTER — Ambulatory Visit (INDEPENDENT_AMBULATORY_CARE_PROVIDER_SITE_OTHER): Payer: Medicare Other | Admitting: Family Medicine

## 2011-12-22 VITALS — Ht 72.0 in | Wt 324.6 lb

## 2011-12-22 DIAGNOSIS — E785 Hyperlipidemia, unspecified: Secondary | ICD-10-CM

## 2011-12-22 DIAGNOSIS — E669 Obesity, unspecified: Secondary | ICD-10-CM

## 2011-12-22 DIAGNOSIS — I10 Essential (primary) hypertension: Secondary | ICD-10-CM

## 2011-12-22 NOTE — Patient Instructions (Addendum)
Yesterday's intake was almost 6,000 calories & >7,000 mg sodium: B (6:30 AM)-  4 pkts inst grits, 1 tbsp butter, 12 oz Equate shake (250 kcal), 12 c coffee w/ 4 pks Sweet 'n Low & 2 oz fat-free milk (consumed in 1 hr) Snk ( AM)-  none L ( PM)-  2 pkgs [employer] Garlic chicken (1604 kcal, >4,000 mg Na, 54 g fat), diet sweet tea Snk ( PM)-  1 pkt hot chocolate D ( PM)-  Restaurant: prime rib w/ asparagus & pasta (1470 kcal, 2490 mg Na, 97 g fat),    salad w/ 6 tbsp ranch, 5-6 rolls w/ butter 1 tsp butter on each, 24 oz sweet tea,    1 slice pecan pie, 1/2 peach cobbler  - The Equate drink you bought will not help you lose weight, but if you want a sweet treat, have one of these.  Remember, sweets like this will NOT help you lose weight despite their advertising.   - Eating out AND using foods that are already prepared (like the Birds Eye meals) will almost always give you a LOT more calories, fat, & sodium than foods you make at home.   - To use up your Birds Eye meals, try this:  Use ONE at a time only, and add more frozen PLAIN, vegetables.   - Include some fresh fruit every day if you can.   - If you eat in a restaurant or at home:  - Always include vegetables  - Limit the amount of fat you add to the food, such as salad dressing and butter  - Drink:  Stick to water or diet drinks.    - Meat:  Limit your portion to 6 ounces.  If it's bigger than that, take part of it home for a later meal.    - Overall portion size:  Be mindful of NOT eating too much.  You should NOT feel uncomfortably full after eating.    - Call [employer] about the BELT program; ask if you are eligible:  [phone].   - A good weight goal for you by Dec 31 (8 weeks) will be:  8-16 pounds lost = 308-316 pounds.

## 2011-12-22 NOTE — Progress Notes (Signed)
Medical Nutrition Therapy:  Appt start time: 1100 end time:  1200.  Assessment:  Primary concerns today: Weight management and metabolic syndrome.  Abrahim has been buying frozen vegetables, but in the form of prepared frozen meals, which are loaded with fat, Na, and kcal.   We discussed this, and I pointed out to him that he consumed yesterday enough food for more than 2 days.  Tyreque was very receptive, saying he does want to learn.  He plans to bring today's AVS with him to see psychologist Enzo Bi, w/ whom he meets each Thursday, & they will review all we went over today.  He had added the Equate drink to his breakfast to help him lose weight, as advertising implies.    24-hr recall suggests intake of >5,950 kcal, >270 g fat, & >7,000 mg Na:  (Up at 6 :15 AM) B (6:30 AM)-  4 pkts inst grits (400 kcal), 1 tbsp butter (130 kcal), 12 oz Equate shake (250 kcal),    12 c coffee w/ Sweet 'n Low & 2 oz fat-free milk/large mug (consumed in 1 hr (860 total kcal)) Snk ( AM)-  none L ( PM)-  2 pkgs Birds Eye Garlic chicken (1604 kcal, >4,000 mg Na, 54 g fat), diet sweet tea Snk ( PM)-  1 pkt hot chocolate D ( PM)-  [employer]: prime rib w/ asparagus & pasta (1470 kcal, 2490 mg Na, 97 g fat),    salad w/ 6 tbsp ranch, 5-6 rolls w/ butter 1 tsp butter on each, 24 oz sweet tea,    1 slice pecan pie, 1/2 peach cobbler Snk ( PM)-  none  Progress Towards Goal(s):  In progress.   Nutritional Diagnosis:  NB-1.7 Undesireable food choices As related to imbalanced diet.  As evidenced by usual intake of fruits and veg's only once a week. NB-2.1 Physical inactivity As related to intake.  As evidenced by usual physical activity limited to walking 5-10 min a couple times a week.    Intervention:  Nutrition education.  Monitoring/Evaluation:  Dietary intake, exercise, and body weight in 3 weeks.

## 2011-12-25 ENCOUNTER — Ambulatory Visit (INDEPENDENT_AMBULATORY_CARE_PROVIDER_SITE_OTHER): Payer: 59 | Admitting: Psychiatry

## 2011-12-25 DIAGNOSIS — F209 Schizophrenia, unspecified: Secondary | ICD-10-CM

## 2012-01-01 ENCOUNTER — Ambulatory Visit: Payer: Self-pay | Admitting: Psychiatry

## 2012-01-03 ENCOUNTER — Emergency Department (HOSPITAL_COMMUNITY)
Admission: EM | Admit: 2012-01-03 | Discharge: 2012-01-03 | Disposition: A | Discharge status: Home / Self Care | Payer: Medicare Other | Attending: Emergency Medicine | Admitting: Emergency Medicine

## 2012-01-03 ENCOUNTER — Encounter (HOSPITAL_COMMUNITY): Payer: Self-pay | Admitting: *Deleted

## 2012-01-03 DIAGNOSIS — G4733 Obstructive sleep apnea (adult) (pediatric): Secondary | ICD-10-CM | POA: Insufficient documentation

## 2012-01-03 DIAGNOSIS — R209 Unspecified disturbances of skin sensation: Secondary | ICD-10-CM | POA: Insufficient documentation

## 2012-01-03 DIAGNOSIS — I10 Essential (primary) hypertension: Secondary | ICD-10-CM | POA: Insufficient documentation

## 2012-01-03 DIAGNOSIS — Z8669 Personal history of other diseases of the nervous system and sense organs: Secondary | ICD-10-CM | POA: Insufficient documentation

## 2012-01-03 DIAGNOSIS — F79 Unspecified intellectual disabilities: Secondary | ICD-10-CM | POA: Insufficient documentation

## 2012-01-03 DIAGNOSIS — M549 Dorsalgia, unspecified: Secondary | ICD-10-CM

## 2012-01-03 DIAGNOSIS — M545 Low back pain, unspecified: Secondary | ICD-10-CM | POA: Insufficient documentation

## 2012-01-03 DIAGNOSIS — Z79899 Other long term (current) drug therapy: Secondary | ICD-10-CM | POA: Insufficient documentation

## 2012-01-03 DIAGNOSIS — R51 Headache: Secondary | ICD-10-CM | POA: Insufficient documentation

## 2012-01-03 DIAGNOSIS — Z8619 Personal history of other infectious and parasitic diseases: Secondary | ICD-10-CM | POA: Insufficient documentation

## 2012-01-03 MED ORDER — DIAZEPAM 5 MG PO TABS: 5.0000 mg | ORAL_TABLET | Freq: Three times a day (TID) | ORAL | Status: DC | PRN

## 2012-01-03 MED ORDER — NAPROXEN 500 MG PO TABS: 500.0000 mg | ORAL_TABLET | Freq: Two times a day (BID) | ORAL | Status: DC | PRN

## 2012-01-03 MED ORDER — IBUPROFEN 400 MG PO TABS
600.0000 mg | ORAL_TABLET | Freq: Once | ORAL | Status: AC
  Administered 2012-01-03: 600 mg via ORAL
  Filled 2012-01-03: qty 1

## 2012-01-03 MED ORDER — OXYCODONE-ACETAMINOPHEN 5-325 MG PO TABS: 1.0000 | ORAL_TABLET | ORAL | Status: DC | PRN

## 2012-01-03 NOTE — ED Notes (Signed)
PT reports he has rods and plates in his back from a previous surgery. Pt 's  Pain is located mostly on Lt side of back near plate per Pt 's info.

## 2012-01-03 NOTE — ED Notes (Signed)
Patient with c/o back pain for about a month.  States that the pain is worse on the left side.  It hurts when he lays in bed

## 2012-01-03 NOTE — ED Provider Notes (Signed)
History  This chart was scribed for Raeford Razor, MD by Ladona Ridgel Day, ED scribe. This patient was seen in room TR06C/TR06C and the patient's care was started at 1057.   CSN: [id number]  Arrival date & time 01/03/12  1057   None     Chief Complaint  Patient presents with  . Back Pain   Patient is a 41 y.o. male presenting with back pain. The history is provided by the patient. No language interpreter was used.  Back Pain  This is a new problem. The current episode started more than 1 week ago. The problem has been gradually worsening. The pain is associated with no known injury. The pain is present in the lumbar spine (left lower back). The quality of the pain is described as shooting. The pain does not radiate. The pain is moderate. The symptoms are aggravated by certain positions. The pain is the same all the time. Associated symptoms include numbness. Pertinent negatives include no fever, no abdominal pain, no dysuria and no weakness. Treatments tried: flexeril without relief, back surgery years ago for buldging disk. The treatment provided no relief.    Past Medical History  Diagnosis Date  . H. pylori infection 10/2001  . Carpal tunnel syndrome of left wrist   . Muscle strain     Multiple  . Mental retardation   . Hypertension   . Chronic headache   . Obstructive sleep apnea     Past Surgical History  Procedure Date  . Cervical disc surgery 2003  . Esophagogastroduodenoscopy 2003    No ulcers  . Appendectomy 2002  . Lumbar laminectomy 12/2002    L3-4, L4-5  . Carpal tunnel release     Family History  Problem Relation Age of Onset  . Migraines Mother   . Heart disease Paternal Grandfather   . Hypertension Paternal Grandmother   . Stroke Paternal Grandfather   . Lung cancer Paternal Grandmother     History  Substance Use Topics  . Smoking status: Never Smoker   . Smokeless tobacco: Never Used  . Alcohol Use: No      Review of Systems  Constitutional:  Negative for fever and chills.  HENT: Negative for congestion.   Respiratory: Negative for shortness of breath.   Gastrointestinal: Negative for nausea, vomiting and abdominal pain.  Genitourinary: Negative for dysuria, urgency, frequency and difficulty urinating.  Musculoskeletal: Positive for back pain (left lumbar back pain).  Neurological: Positive for numbness. Negative for weakness.  All other systems reviewed and are negative.    Allergies  Review of patient's allergies indicates no known allergies.  Home Medications   Current Outpatient Rx  Name  Route  Sig  Dispense  Refill  . ARIPIPRAZOLE 10 MG PO TABS   Oral   Take 10 mg by mouth daily.         Marland Kitchen CALCIPOTRIENE 0.005 % EX OINT   Topical   Apply 1 application topically 2 (two) times daily. Avoid face.         . CYCLOBENZAPRINE HCL 5 MG PO TABS   Oral   Take 10 mg by mouth 3 (three) times daily as needed. For muscle spasms         . FLUOXETINE HCL 40 MG PO CAPS   Oral   Take 40 mg by mouth daily.          Marland Kitchen GABAPENTIN 600 MG PO TABS   Oral   Take 1,200 mg by mouth 3 (three) times daily.         Marland Kitchen  HYDROCHLOROTHIAZIDE 25 MG PO TABS   Oral   Take 25 mg by mouth daily.         Marland Kitchen KETOROLAC TROMETHAMINE 10 MG PO TABS   Oral   Take 10 mg by mouth every 6 (six) hours as needed. For bad headaches         . LISINOPRIL-HYDROCHLOROTHIAZIDE 20-25 MG PO TABS   Oral   Take 1 tablet by mouth daily.         Marland Kitchen LORATADINE 10 MG PO TABS   Oral   Take 10 mg by mouth daily.         Marland Kitchen OMEPRAZOLE 40 MG PO CPDR   Oral   Take 40 mg by mouth daily.         . TRAMADOL HCL 50 MG PO TABS   Oral   Take 50 mg by mouth every 8 (eight) hours as needed. For pain         . TRAZODONE HCL 100 MG PO TABS   Oral   Take 200 mg by mouth at bedtime.          Marland Kitchen LIDOCAINE 5 % EX PTCH   Transdermal   Place 1 patch onto the skin every 12 (twelve) hours. Remove & Discard patch within 12 hours or as directed by MD    30 patch   2     Triage Vitals: BP 142/84  Pulse 78  Temp 98.2 F (36.8 C) (Oral)  Resp 20  SpO2 97%  Physical Exam  Nursing note and vitals reviewed. Constitutional: He appears well-developed and well-nourished. No distress.  HENT:  Head: Normocephalic and atraumatic.  Eyes: Conjunctivae normal are normal. Right eye exhibits no discharge. Left eye exhibits no discharge.  Neck: Neck supple.  Cardiovascular: Normal rate, regular rhythm and normal heart sounds.  Exam reveals no gallop and no friction rub.   No murmur heard. Pulmonary/Chest: Effort normal and breath sounds normal. No respiratory distress.  Abdominal: Soft. He exhibits no distension. There is no tenderness.  Musculoskeletal: He exhibits tenderness. He exhibits no edema.       Well healed incision of lumbar region. Left lumbar paraspinal tenderness. No overlying skin changes. NVI distally.  Neurological: He is alert.  Skin: Skin is warm and dry.  Psychiatric: He has a normal mood and affect. His behavior is normal. Thought content normal.    ED Course  Procedures (including critical care time) DIAGNOSTIC STUDIES: Oxygen Saturation is 97% on room air, normal by my interpretation.    COORDINATION OF CARE: At 1155 AM Discussed treatment plan with patient which includes pain medicine. Patient agrees.   Labs Reviewed - No data to display No results found.   1. Back pain       MDM  41yM with back pain. Atraumatic. Non focal neuro exam. No blood thinning medications. No bladder/bowel incontinence or retention. Denies hx of IV drug use. Doubt cauda equina, spinal epidural abscess, spinal epidural hematoma, fracture, vertebral osteomyelitis/discitis or other potential emergent etiology. No indication for emergent imaging.  Plan symptomatic tx. Return precautions discussed. Outpt FU otherwise.   I personally preformed the services scribed in my presence. The recorded information has been reviewed is accurate.  Raeford Razor, MD.          Raeford Razor, MD 01/03/12 1213

## 2012-01-05 ENCOUNTER — Encounter: Payer: Self-pay | Admitting: Family Medicine

## 2012-01-05 ENCOUNTER — Ambulatory Visit (INDEPENDENT_AMBULATORY_CARE_PROVIDER_SITE_OTHER): Payer: Medicare Other | Admitting: Family Medicine

## 2012-01-05 VITALS — BP 144/79 | HR 76 | Temp 98.6°F | Ht 72.0 in | Wt 324.0 lb

## 2012-01-05 DIAGNOSIS — T148XXA Other injury of unspecified body region, initial encounter: Secondary | ICD-10-CM

## 2012-01-05 DIAGNOSIS — M545 Low back pain, unspecified: Secondary | ICD-10-CM | POA: Insufficient documentation

## 2012-01-05 DIAGNOSIS — M549 Dorsalgia, unspecified: Secondary | ICD-10-CM

## 2012-01-05 LAB — POCT URINALYSIS DIPSTICK
Bilirubin, UA: NEGATIVE
Blood, UA: NEGATIVE
Glucose, UA: NEGATIVE
Ketones, UA: NEGATIVE
Spec Grav, UA: 1.02
Urobilinogen, UA: 0.2

## 2012-01-05 MED ORDER — PREDNISONE 10 MG PO TABS: 20.0000 mg | ORAL_TABLET | Freq: Every day | ORAL | Status: DC

## 2012-01-05 MED ORDER — LIDOCAINE 5 % EX PTCH: 1.0000 | MEDICATED_PATCH | Freq: Two times a day (BID) | CUTANEOUS | Status: DC

## 2012-01-05 NOTE — Progress Notes (Signed)
Patient ID: [name], male   DOB: [birth year], 41 y.o.   MRN: [id number] [name] Brennin Durfee M. Porfirio Bollier, MD Phone: [phone]   Subjective: HPI: Patient is a 41 y.o. male presenting to clinic today for follow up for back pain.  Patient seen in ED 2 days ago for left lower back pain. Patient states he has had back pain for 2 months then he was out of town last week and states it started hurting more when laying down and when walking. When he is sitting long periods of time it is difficult to walk when he stands up due to the pain. Some intermittent numbness down leg but not consistently and unsure if it is related to the back pain. No dysuria, urinary urgency, no fevers. He has tried tramadol and flexeril with no relief. No pain on right side. No loss of bowel or bladder. He has had some back pain in the past, but this is different. Denies any injury  History Reviewed: Non-smoker. Health Maintenance: UTD on flu shot  ROS: Please see HPI above.  Objective: Office vital signs reviewed.  Physical Examination:  General: Awake, alert. NAD HEENT: Atraumatic, normocephalic Pulm: CTAB, no wheezes Cardio: RRR, no murmurs appreciated Abdomen: obese, soft, nontender, nondistended. NO CVA tenderness Back: No bony tenderness. Mild tenderness to palpation left lumbar/paraspinal muscles. Extremities: Mild edema, no redness. Neuro: Grossly intact  Assessment: 41 yo M with musculoskeletal back pain, likely secondary to strain  Plan: See Problem List and After Visit Summary

## 2012-01-05 NOTE — Patient Instructions (Signed)
I think your back is a muscle strain from either your trip or resting uncomfortably. It is obvious you are having a lot of pain from that so I have prescribed a steroid for you to take for the next 5 days. That will hopefully calm down all of the inflammation. I have also sent in a Lidoderm patch for you to use daily, as needed for local pain control.  I hope you feel better! Come back to see me in one month for your regular follow up. Let me know if you need anything else!  Dagmar Adcox M. Oaklynn Stierwalt, M.D.

## 2012-01-05 NOTE — Assessment & Plan Note (Signed)
Most likely secondary to MSK. Will give Lidoderm patch to use as needed. Also, given Prednisone 20mg  x5 days to help with inflammation. Should continue flexeril and Tramadol as previously prescribed. RTC in one month, or sooner if needed

## 2012-01-08 ENCOUNTER — Ambulatory Visit (INDEPENDENT_AMBULATORY_CARE_PROVIDER_SITE_OTHER): Payer: 59 | Admitting: Psychiatry

## 2012-01-08 DIAGNOSIS — F209 Schizophrenia, unspecified: Secondary | ICD-10-CM

## 2012-01-12 ENCOUNTER — Ambulatory Visit (INDEPENDENT_AMBULATORY_CARE_PROVIDER_SITE_OTHER): Payer: Medicare Other | Admitting: Family Medicine

## 2012-01-12 VITALS — Ht 72.0 in | Wt 331.0 lb

## 2012-01-12 DIAGNOSIS — E785 Hyperlipidemia, unspecified: Secondary | ICD-10-CM

## 2012-01-12 DIAGNOSIS — E669 Obesity, unspecified: Secondary | ICD-10-CM

## 2012-01-12 DIAGNOSIS — I10 Essential (primary) hypertension: Secondary | ICD-10-CM

## 2012-01-12 NOTE — Patient Instructions (Addendum)
-   The [employer] [place] omelette you had yesterday: Calories 1120 Total Fat 82g (more than 1/3 was saturated) Cholesterol 910mg   (3 times as much as recommended limit) Sodium 2240mg  (1 & a half times as much as recommended limit) Carbohydrates 24g Dietary Fiber 3g 12% Sugars 10g Protein 71g  - And the 3 pancakes were more than 500 calories including the syrup.    EATING FOODS YOU PREPARE AT HOME INSTEAD OF TAKEOUT OR RESTAURANT FOOD USUALLY MEANS GETTING FEWER CALORIES, AND LESS FAT, SUGAR, & SODIUM.    - GOAL:  At least 18 home-prepared meals per week (restaurant meals only 3 times). - Breakfast:   2 boiled eggs with 2 slices whole-wheat toast, 1 cup of yogurt, 1 fresh fruit   (Or high-fiber cereal like Shredded Wheat 'n Bran or Raisin Bran with fat-free milk and fruit).  - Lunch:   Meat (roast beef, ham, or [place]) or peanut butter or tunafish sandwich    Fruit   Salad or vegetable soup (add frozen veg's to your canned soup)   1 cup of yogurt - Dinner:  Meat or fish   Some starch food   Vegetable and a fruit  - And when you go out,  Choose this    In place of that     Whole wheat toast  Biscuit     Fresh fruit   Sausage/bacon     Scrambled or      boiled eggs   Fried eggs  - And go light on butter, cheese, salt, and the sweets (like syrup).    - Remember to buy just plain frozen vegetables.    - Be sure to share this handout with Dr. Noe Gens and with Maralyn Sago.

## 2012-01-12 NOTE — Progress Notes (Signed)
Medical Nutrition Therapy:  Appt start time: 1100 end time:  1200.  Assessment:  Primary concerns today: Weight management and HTN, hyperlipidemia (metabolic syndrome).  Lance Luna took some pictures of some of the meals he had last week, i.e., Friday's breakfast was [place] eggs, sausage, grits, biscuit, tomato slices, diet soda.  Friday night was Lance Luna fried shrimp, catfish, hush puppies, and baked potato, coleslaw, and diet soda.  Sunday night's meal was [employer] cheese, ham, bacon omelette, 3 pancakes w/ syrup & butter, diet soda.  Lunch yesterday was all-meat pizza.  Lance Luna said he usually eats out only about 3 X wk, but a friend was in town last weekend, and took him out several times.  While his understanding of nutrition is limited, he knew that the fried seafood plate was not in his best interest for health or weight loss.   Lance Luna is unable to exercise much at this time b/c of back pain.    Progress Towards Goal(s):  In progress.   Nutritional Diagnosis:  NB-1.7 Undesireable food choices As related to imbalanced diet.  As evidenced by usual intake of fruits and veg's only once a week. NB-2.1 Physical inactivity As related to intake.  As evidenced by usual physical activity limited to walking 5-10 min a couple times a week.    Intervention:  Nutrition education.  Monitoring/Evaluation:  Dietary intake, exercise, and body weight in 3 weeks.

## 2012-01-19 ENCOUNTER — Ambulatory Visit (INDEPENDENT_AMBULATORY_CARE_PROVIDER_SITE_OTHER): Payer: Medicare Other | Admitting: Home Health Services

## 2012-01-19 ENCOUNTER — Encounter: Payer: Self-pay | Admitting: Home Health Services

## 2012-01-19 VITALS — BP 139/94 | HR 71 | Temp 97.0°F | Ht 72.0 in | Wt 332.4 lb

## 2012-01-19 DIAGNOSIS — Z0000 Encounter for general adult medical examination without abnormal findings: Secondary | ICD-10-CM

## 2012-01-19 NOTE — Progress Notes (Signed)
Patient here for annual wellness visit, patient reports: Risk Factors/Conditions needing evaluation or treatment: Pt does not have any new risk factors that need evaluation. Home Safety: Pt lives by self in an apartment.  Pt received support for [employer] [place]. Pt reports having smoke detectors and does not have adaptive equipment in bathroom. Other Information: Corrective lens: Pt wears daily corrective lens. Pt reports annual eye exams. Dentures: Pt has full dentures.  Memory: Pt reports some memory problems. Patient's Mini Mental Score (recorded in doc. flowsheet): 29- pt has low literacy score may not arcuately reflect cognitive ability.  Pt denies hearing problems nor ringing in ears.  Balance/Gait: Pt reports history of falls without injury.  Reports at times dizziness and weakness in legs.  Pt occasionally uses cane.    Annual Wellness Visit Requirements Recorded Today In  Medical, family, social history Past Medical, Family, Social History Section  Current providers Care team  Current medications Medications  Wt, BP, Ht, BMI Vital signs  Hearing assessment (welcome visit) Progress Notes  Tobacco, alcohol, illicit drug use History  ADL Nurse Assessment  Depression Screening Nurse Assessment  Cognitive impairment Nurse Assessment  Mini Mental Status Document Flowsheet  Fall Risk Nurse Assessment  Home Safety Progress Note  End of Life Planning (welcome visit) Social Documentation  Medicare preventative services Progress Note  Risk factors/conditions needing evaluation/treatment Progress Note  Personalized health advice Patient Instructions, goals, letter  Diet & Exercise Social Documentation  Emergency Contact Social Documentation  Seat Belts Social Documentation  Sun exposure/protection Social Documentation    Medicare Prevention Plan:   Recommended Medicare Prevention Screenings Men over 65 Test For Frequency Date of Last- BOLD if needed  Colorectal Cancer 1-10  yrs NI due to age  Prostate Cancer Never or yearly NI  Aortic Aneurysm Once if 65-75 with hx of smoking NI due to age  Cholesterol 5 yrs 3/12  Diabetes yearly 9/12  HIV yearly declined  Influenza Shot yearly 10/13  Pneumonia Shot once NI  Zostavax Shot once NI

## 2012-01-19 NOTE — Patient Instructions (Signed)
1. No soda 2. Eat less food 3. Eat more vegetables

## 2012-01-19 NOTE — Progress Notes (Signed)
I have reviewed this visit and discussed with Lance Luna and agree with her documentation. [name] [name], M.D. 01/19/2012 4:28 PM

## 2012-01-22 ENCOUNTER — Ambulatory Visit (INDEPENDENT_AMBULATORY_CARE_PROVIDER_SITE_OTHER): Payer: 59 | Admitting: Psychiatry

## 2012-01-22 DIAGNOSIS — F209 Schizophrenia, unspecified: Secondary | ICD-10-CM

## 2012-01-23 ENCOUNTER — Other Ambulatory Visit: Payer: Self-pay | Admitting: Family Medicine

## 2012-01-23 MED ORDER — TRAMADOL HCL 50 MG PO TABS: 50.0000 mg | ORAL_TABLET | Freq: Three times a day (TID) | ORAL | Status: DC | PRN

## 2012-01-23 NOTE — Telephone Encounter (Signed)
I will only refill Tramadol for now. He can get other Rx at his follow up appointment.   Thanks, Hospital doctor

## 2012-01-23 NOTE — Telephone Encounter (Signed)
Will probably need to wait till his appt on the 18, but will forward to MD for answer. Hervey Wedig, Maryjo Rochester

## 2012-01-23 NOTE — Telephone Encounter (Signed)
Patient is calling for refills on his Oxycodone and Tramadol.  He uses [facility] [address]

## 2012-01-23 NOTE — Telephone Encounter (Signed)
LM on identifiable VM that tramadol was called in but the other could not be called in until his appt. Fleeger, Maryjo Rochester

## 2012-01-29 ENCOUNTER — Other Ambulatory Visit: Payer: Self-pay | Admitting: Neurology

## 2012-01-29 ENCOUNTER — Ambulatory Visit (INDEPENDENT_AMBULATORY_CARE_PROVIDER_SITE_OTHER): Payer: 59 | Admitting: Psychiatry

## 2012-01-29 ENCOUNTER — Ambulatory Visit
Admission: RE | Admit: 2012-01-29 | Discharge: 2012-01-29 | Disposition: A | Discharge status: Home / Self Care | Payer: Medicare Other | Source: Ambulatory Visit | Attending: Neurology | Admitting: Neurology

## 2012-01-29 DIAGNOSIS — M542 Cervicalgia: Secondary | ICD-10-CM

## 2012-01-29 DIAGNOSIS — F209 Schizophrenia, unspecified: Secondary | ICD-10-CM

## 2012-02-02 ENCOUNTER — Ambulatory Visit: Payer: Self-pay | Admitting: Family Medicine

## 2012-02-04 ENCOUNTER — Ambulatory Visit (HOSPITAL_COMMUNITY)
Admission: RE | Admit: 2012-02-04 | Discharge: 2012-02-04 | Disposition: A | Discharge status: Home / Self Care | Payer: Medicare Other | Source: Ambulatory Visit | Attending: Family Medicine | Admitting: Family Medicine

## 2012-02-04 ENCOUNTER — Encounter: Payer: Self-pay | Admitting: Family Medicine

## 2012-02-04 ENCOUNTER — Ambulatory Visit (INDEPENDENT_AMBULATORY_CARE_PROVIDER_SITE_OTHER): Payer: Medicare Other | Admitting: Family Medicine

## 2012-02-04 VITALS — BP 149/90 | HR 88 | Ht 72.0 in | Wt 323.0 lb

## 2012-02-04 DIAGNOSIS — IMO0001 Reserved for inherently not codable concepts without codable children: Secondary | ICD-10-CM

## 2012-02-04 DIAGNOSIS — M545 Low back pain, unspecified: Secondary | ICD-10-CM | POA: Insufficient documentation

## 2012-02-04 DIAGNOSIS — I10 Essential (primary) hypertension: Secondary | ICD-10-CM

## 2012-02-04 DIAGNOSIS — F79 Unspecified intellectual disabilities: Secondary | ICD-10-CM

## 2012-02-04 DIAGNOSIS — Z79899 Other long term (current) drug therapy: Secondary | ICD-10-CM

## 2012-02-04 DIAGNOSIS — IMO0002 Reserved for concepts with insufficient information to code with codable children: Secondary | ICD-10-CM

## 2012-02-04 DIAGNOSIS — E7800 Pure hypercholesterolemia, unspecified: Secondary | ICD-10-CM

## 2012-02-04 LAB — CBC
HCT: 46.8 % (ref 39.0–52.0)
Hemoglobin: 16.5 g/dL (ref 13.0–17.0)
MCH: 31 pg (ref 26.0–34.0)
MCV: 87.8 fL (ref 78.0–100.0)
RBC: 5.33 MIL/uL (ref 4.22–5.81)
WBC: 11.2 10*3/uL — ABNORMAL HIGH (ref 4.0–10.5)

## 2012-02-04 LAB — POCT GLYCOSYLATED HEMOGLOBIN (HGB A1C): Hemoglobin A1C: 5.1

## 2012-02-04 MED ORDER — HYDROCODONE-ACETAMINOPHEN 5-500 MG PO TABS: 1.0000 | ORAL_TABLET | Freq: Three times a day (TID) | ORAL | Status: DC | PRN

## 2012-02-04 NOTE — Assessment & Plan Note (Signed)
BP remains elevated on Lisinopril-HCTZ. Will continue on current dose for now since it has been 6 weeks, then increase if it remains elevated once his pain improves.

## 2012-02-04 NOTE — Assessment & Plan Note (Signed)
Followed by mental health. Patient states he will bring his test scores for [place] to scan into the computer. Of note, mental health provider requested labs which were ordered today.

## 2012-02-04 NOTE — Assessment & Plan Note (Addendum)
Patient has had lower back fusion in the past, but now seems to have radiculopathy. Will send for plain films of his back. Continue Vicodin for pain. Consider referral to ortho vs. Neurosurgeon for further evaluation based on X-ray findings.   ADDENDUM: Reviewed back films, no acute abnormality. Given the extent of his pain and his past lumbar spine surgery, will refer back to neurosurgery for evaluation. Called patient and discussed plan. States he is still having pain and agrees with plan.

## 2012-02-04 NOTE — Progress Notes (Signed)
Patient ID: [name], male   DOB: 02-19-1970, 41 y.o.   MRN: [id number] [name] Audryanna Zurita M. Qais Jowers, MD Phone: [phone]   Subjective: HPI: Patient is a 41 y.o. male presenting to clinic today for follow up appointment. Concerns today include: Lower back pain  1. Lower back pain- States this pain is the worst pain he has ever had. He states it all started 5 months ago left sided, progressively worsening. Difficulty walking. Goes down legs into both feet. Numbness going to bottom of feet. Denies any weakness of legs. S/p surgery on lower back- Dr. Wynetta Emery with [facility]. Pain is worse with sitting and walking. Nothing makes the pain better. No loss of bowels or bladder.  2. Hypertension-  Blood pressure today: 149/90 Taking Meds: Yes, Lisinopril-HCTZ  Side effects: None  ROS: Denies headache visual changes nausea, vomiting, chest pain or abdominal pain or shortness of breath.  3. Mental health-  Stable. Followed by [facility], Dr. Noe Gens. Increased depression. Manufacturing engineer. Taking all medications as prescribed. No SI/HI. Going to [employer] for reading skills, two days per week starting in January.    History Reviewed: Non-smoker. Health Maintenance: Up to date. Needs basic labs.  ROS: Please see HPI above.  Objective: Office vital signs reviewed.  Physical Examination:  General: Awake, alert. NAD. Sitting comfortably but has discomfort with changing positions HEENT: Atraumatic, normocephalic. Pulm: CTAB, no wheezes Cardio: RRR, no murmurs appreciated Abdomen:Obese. +BS, soft, nontender, nondistended Back: TTP L3-S1. Extremely limited ROM. Unable to flex or extend (?secondary to past surgery or to pain). Able to bend side to side, but no rotation. Walks with slight limp but able to get on exam table without assistance. +straight leg raise bilaterally.  Extremities: No edema Neuro: Decreased patellar reflexes, but normal achilles.  (May be secondary to technical performance of exam.) Otherwise, grossly normal  Assessment: 41 yo M follow up appointment with acute on chronic back pain  Plan: See Problem List and After Visit Summary

## 2012-02-04 NOTE — Patient Instructions (Addendum)
I am sorry your back is still bothering you. Please stop by the lab to get your blood drawn, then go to the hospital to get your back X-rays.  I will call you tomorrow with the results and we will go from there. Please rest as much as you can.  Take care! Mahitha Hickling M. Copeland Lapier, M.D.

## 2012-02-05 ENCOUNTER — Ambulatory Visit: Payer: Self-pay | Admitting: Family Medicine

## 2012-02-05 ENCOUNTER — Telehealth: Payer: Self-pay | Admitting: Family Medicine

## 2012-02-05 ENCOUNTER — Ambulatory Visit: Payer: Self-pay | Admitting: Psychiatry

## 2012-02-05 LAB — COMPREHENSIVE METABOLIC PANEL WITH GFR
ALT: 47 U/L (ref 0–53)
AST: 22 U/L (ref 0–37)
Albumin: 4.7 g/dL (ref 3.5–5.2)
Alkaline Phosphatase: 75 U/L (ref 39–117)
BUN: 9 mg/dL (ref 6–23)
CO2: 30 meq/L (ref 19–32)
Calcium: 10 mg/dL (ref 8.4–10.5)
Chloride: 100 meq/L (ref 96–112)
Creat: 0.89 mg/dL (ref 0.50–1.35)
Glucose, Bld: 79 mg/dL (ref 70–99)
Potassium: 4.2 meq/L (ref 3.5–5.3)
Sodium: 139 meq/L (ref 135–145)
Total Bilirubin: 0.8 mg/dL (ref 0.3–1.2)
Total Protein: 7.7 g/dL (ref 6.0–8.3)

## 2012-02-05 NOTE — Telephone Encounter (Signed)
Patient calling about worsening back pain.  Taking Vicodin every 8 hours and not making any difference.  He says pain is so bad that he cannot walk.  He has tried heating pads and does not have any other analgesics at home.  Patient says he has had disk problems in the past and is supposed to follow up with neurosurgeon.  Patient says he has some numbness in both legs, but no loss of sensation and no loss of bowel or bladder function.  I advised patient to take Vicodin every 4-6 hours as needed for back pain.  If this does not improve pain, then he should schedule appointment with [place] in AM.  If he develops worsening numbness, weakness, bowel or bladder incontinence, or cannot stand, advised patient to come to ED.  He understood plan.

## 2012-02-05 NOTE — Telephone Encounter (Addendum)
Patient states he talked with Dr. Mikel Cella earlier today about Xray and back pain. States Vicodin not helping pain. Has difficulty walking  due to pain.  Paged Dr. Mikel Cella .

## 2012-02-05 NOTE — Telephone Encounter (Signed)
Patient is calling because he is having excrutiating back pain.  He said that Dr. Mikel Cella called him earlier and suggested he go to the hospital but he wants to speak with someone before doing that.

## 2012-02-05 NOTE — Telephone Encounter (Signed)
Patient calls back at 12:00 Noon  stating "what am I suppose to do,I can't walk.  He really doesn't have explanation as to why he did not come to appointment that was scheduled at 10:45. Advised that we will be glad to see him today and he can come in this afternoon for a work in appointment or if he is in severe pain and feels he needs to go to ED can do this also. He wants to know if he tells the ED doctor that he needs a scan.  Advised that this is why he should come in here today to be reevaluated to see what is indicated. He states" what different will they do from yesterday."  He will decide if he wants appointment here today and will call back.

## 2012-02-05 NOTE — Addendum Note (Signed)
Addended by: Hilarie Fredrickson on: 02/05/2012 08:10 AM   Modules accepted: Orders

## 2012-02-05 NOTE — Telephone Encounter (Signed)
Spoke with Dr. Mikel Cella and she advises if pain worsening should return for re evaluation. She had suggested rest, and pain medication but patient states today he cannot walk. Appointment scheduled for 10:45.

## 2012-02-06 ENCOUNTER — Observation Stay (HOSPITAL_COMMUNITY): Payer: Medicare Other

## 2012-02-06 ENCOUNTER — Encounter (HOSPITAL_COMMUNITY): Payer: Self-pay | Admitting: Physical Medicine and Rehabilitation

## 2012-02-06 ENCOUNTER — Observation Stay (HOSPITAL_COMMUNITY)
Admission: EM | Admit: 2012-02-06 | Discharge: 2012-02-06 | Disposition: A | Discharge status: Home / Self Care | Payer: Medicare Other | Attending: Emergency Medicine | Admitting: Emergency Medicine

## 2012-02-06 DIAGNOSIS — R209 Unspecified disturbances of skin sensation: Secondary | ICD-10-CM | POA: Insufficient documentation

## 2012-02-06 DIAGNOSIS — Z981 Arthrodesis status: Secondary | ICD-10-CM | POA: Insufficient documentation

## 2012-02-06 DIAGNOSIS — M5126 Other intervertebral disc displacement, lumbar region: Secondary | ICD-10-CM | POA: Insufficient documentation

## 2012-02-06 DIAGNOSIS — M549 Dorsalgia, unspecified: Principal | ICD-10-CM | POA: Insufficient documentation

## 2012-02-06 MED ORDER — IBUPROFEN 400 MG PO TABS
600.0000 mg | ORAL_TABLET | Freq: Once | ORAL | Status: AC
  Administered 2012-02-06: 600 mg via ORAL
  Filled 2012-02-06: qty 1

## 2012-02-06 MED ORDER — OXYCODONE-ACETAMINOPHEN 5-325 MG PO TABS
1.0000 | ORAL_TABLET | Freq: Once | ORAL | Status: AC
  Administered 2012-02-06: 1 via ORAL
  Filled 2012-02-06: qty 1

## 2012-02-06 MED ORDER — DIAZEPAM 2 MG PO TABS
2.0000 mg | ORAL_TABLET | Freq: Once | ORAL | Status: AC
  Administered 2012-02-06: 2 mg via ORAL
  Filled 2012-02-06: qty 1

## 2012-02-06 MED ORDER — HYDROCODONE-ACETAMINOPHEN 5-325 MG PO TABS
2.0000 | ORAL_TABLET | Freq: Once | ORAL | Status: AC
  Administered 2012-02-06: 2 via ORAL
  Filled 2012-02-06: qty 2

## 2012-02-06 MED ORDER — DIAZEPAM 5 MG PO TABS: 5.0000 mg | ORAL_TABLET | Freq: Four times a day (QID) | ORAL | Status: DC | PRN

## 2012-02-06 MED ORDER — DIAZEPAM 5 MG PO TABS: 5.0000 mg | ORAL_TABLET | Freq: Two times a day (BID) | ORAL | Status: DC

## 2012-02-06 MED ORDER — PREDNISONE 10 MG PO TABS: 40.0000 mg | ORAL_TABLET | Freq: Every day | ORAL | Status: DC

## 2012-02-06 MED ORDER — OXYCODONE-ACETAMINOPHEN 5-325 MG PO TABS
1.0000 | ORAL_TABLET | Freq: Four times a day (QID) | ORAL | Status: DC | PRN
  Administered 2012-02-06: 1 via ORAL
  Filled 2012-02-06: qty 1

## 2012-02-06 NOTE — ED Provider Notes (Signed)
History     CSN: [id number]  Arrival date & time 02/06/12  1025   First MD Initiated Contact with Patient 02/06/12 1105      Chief Complaint  Patient presents with  . Back Pain    (Consider location/radiation/quality/duration/timing/severity/associated sxs/prior treatment) HPI Comments: Pt comes in with cc of back pain. Pt has hx of back pain, and surgery in the past. The back pain has progressed over the past few weeks. This Wednesday, he had a Xray done - results are normal. He however, has been having new numbness, and weakness over the past few days in bilateral legs, left worse than right. Also unable to climb stairs due to pain,, but also due to some weakness. No associated urinary incontinence, urinary retention, bowel incontinence.     Patient is a 41 y.o. male presenting with back pain. The history is provided by the patient and medical records.  Back Pain  Associated symptoms include numbness and weakness. Pertinent negatives include no chest pain, no abdominal pain and no dysuria.    Past Medical History  Diagnosis Date  . H. pylori infection 10/2001  . Carpal tunnel syndrome of left wrist   . Muscle strain     Multiple  . Mental retardation   . Hypertension   . Chronic headache   . Obstructive sleep apnea     Past Surgical History  Procedure Date  . Cervical disc surgery 2003  . Esophagogastroduodenoscopy 2003    No ulcers  . Appendectomy 2002  . Lumbar laminectomy 12/2002    L3-4, L4-5  . Carpal tunnel release     Family History  Problem Relation Age of Onset  . Migraines Mother   . Heart disease Paternal Grandfather   . Hypertension Paternal Grandmother   . Stroke Paternal Grandfather   . Lung cancer Paternal Grandmother     History  Substance Use Topics  . Smoking status: Never Smoker   . Smokeless tobacco: Never Used  . Alcohol Use: No      Review of Systems  Constitutional: Negative for activity change and appetite change.   Respiratory: Negative for cough and shortness of breath.   Cardiovascular: Negative for chest pain.  Gastrointestinal: Negative for abdominal pain.  Genitourinary: Negative for dysuria.  Musculoskeletal: Positive for back pain and gait problem. Negative for myalgias.  Neurological: Positive for weakness and numbness.    Allergies  Review of patient's allergies indicates no known allergies.  Home Medications   Current Outpatient Rx  Name  Route  Sig  Dispense  Refill  . ARIPIPRAZOLE 10 MG PO TABS   Oral   Take 10 mg by mouth daily.         Marland Kitchen CALCIPOTRIENE 0.005 % EX OINT   Topical   Apply 1 application topically 2 (two) times daily. Avoid face.         . CYCLOBENZAPRINE HCL 5 MG PO TABS   Oral   Take 10 mg by mouth 3 (three) times daily as needed. For muscle spasms         . DIAZEPAM 5 MG PO TABS   Oral   Take 5 mg by mouth every 8 (eight) hours as needed. For muscle spasms         . FLUOXETINE HCL 40 MG PO CAPS   Oral   Take 40 mg by mouth daily.          Marland Kitchen GABAPENTIN 600 MG PO TABS   Oral   Take 1,200  mg by mouth 3 (three) times daily.         Marland Kitchen HYDROCODONE-ACETAMINOPHEN 5-500 MG PO TABS   Oral   Take 1 tablet by mouth every 4 (four) hours.         Marland Kitchen KETOROLAC TROMETHAMINE 10 MG PO TABS   Oral   Take 10 mg by mouth every 6 (six) hours as needed. For bad headaches         . LISINOPRIL-HYDROCHLOROTHIAZIDE 20-25 MG PO TABS   Oral   Take 1 tablet by mouth daily.         Marland Kitchen LORATADINE 10 MG PO TABS   Oral   Take 10 mg by mouth daily.         Marland Kitchen OMEPRAZOLE 40 MG PO CPDR   Oral   Take 40 mg by mouth daily.         . TRAMADOL HCL 50 MG PO TABS   Oral   Take 1 tablet (50 mg total) by mouth every 8 (eight) hours as needed. For pain   30 tablet   0   . TRAZODONE HCL 100 MG PO TABS   Oral   Take 200 mg by mouth at bedtime.            BP 143/83  Pulse 69  Temp 98.4 F (36.9 C) (Oral)  Resp 18  SpO2 95%  Physical Exam  Nursing  note and vitals reviewed. Constitutional: He is oriented to person, place, and time. He appears well-developed.  HENT:  Head: Normocephalic and atraumatic.  Eyes: Conjunctivae normal and EOM are normal. Pupils are equal, round, and reactive to light.  Neck: Normal range of motion. Neck supple.  Cardiovascular: Normal rate and regular rhythm.   Pulmonary/Chest: Effort normal and breath sounds normal.  Abdominal: Soft. Bowel sounds are normal. He exhibits no distension. There is no tenderness. There is no rebound and no guarding.  Musculoskeletal:       Pt has tenderness over the lumbar region No step offs, no erythema.. Able to discriminate between sharp and dull - but the left lower extremity is numb compared to the right. Pt has + bilateral straight leg raise.  Neurological: He is alert and oriented to person, place, and time.  Skin: Skin is warm.    ED Course  Procedures (including critical care time)  Labs Reviewed - No data to display Dg Lumbar Spine Complete  02/04/2012  *RADIOLOGY REPORT*  Clinical Data: Chronic low back pain  LUMBAR SPINE - COMPLETE 4+ VIEW  Comparison: 09/12/2004  Findings: Four view lumbar spine shows no fracture.  The patient is status post PLIF from L3-L5.  Interbody graft material at L3-4 and L4-5 appears incorporated into the adjacent endplates.  The mild spondylolisthesis at L4-5 is unchanged.  No hardware complications.  SI joints are normal in appearance.  IMPRESSION: Stable exam.  No radiographic findings to explain the patient's history of pain.   Original Report Authenticated By: Kennith Center, M.D.      No diagnosis found.    MDM  DDx includes: - DJD of the back - Spondylitises/ spondylosis - Sciatica - Spinal cord compression - Conus medullaris - Epidural hematoma - Epidural abscess - Lytic/pathologic fracture - Myelitis - Musculoskeletal pain   Pt with hx of back disease, and surgery comes in with cc of worsening back pain for weeks  - worse over the past few days with new numbness, and tingling in the legs and subjective weakness. Neuro exam reveals subjective  numbness, worse on the left side. Strength is 4+/5 bilaterally. Pt has reproducible back pain and straight leg test. Had a recent Xray - no acute dz.  Plan is to get a MRI this visit. Dr. Glee Arvin is the Neurosurgeon, and we will call him if there is any significant findings.   Derwood Kaplan, MD 02/06/12 1224

## 2012-02-06 NOTE — ED Notes (Signed)
Pt d/c'd from continuous pulse oximetry and blood pressure cuff; pt getting dressed to be discharged home; will place pt in wheelchair and wheel him to main ED waiting area so his ride can pick him up at the ED entrance

## 2012-02-06 NOTE — Telephone Encounter (Signed)
Patient has not called back and I called him again and left message to return call.

## 2012-02-06 NOTE — ED Notes (Signed)
Pt presents to department via [facility] for evaluation of back pain x1 week. Recently had x-ray on Wednesday, states pain has become worse since. 10/10 pain upon arrival. Denies recent injury. He is conscious alert and oriented x4. Able to ambulate, but increases pain.

## 2012-02-06 NOTE — ED Notes (Signed)
Pt undressed, in gown, on continuous pulse oximetry and blood pressure cuff; vitals taken; family at bedside

## 2012-02-06 NOTE — Telephone Encounter (Addendum)
Spoke with patient and he wants to know if a doctor will call over to the ED and order  an MRI.  Attempted to explain that if he goes to ED the doctor there will see him and determine if he needs an MRI. Again suggested he come here to be  reevaluated.  He states " you people are not listening I can't walk ,my legs are going numb I need an MRI. " Advised if he feels he needs to to call 911 and have them transport him to ED  to do that .  Phone call  was disconnected

## 2012-02-06 NOTE — Telephone Encounter (Signed)
Patient called back , states phone is dropping calls. Disconnected again.

## 2012-02-06 NOTE — ED Provider Notes (Signed)
3:21 PM  Assumed care of patient in the CDU from [name], PA-C.  Patient is currently on back pain protocol.  Patient had back surgery performed four years ago by Dr. Wynetta Emery with Neurosurgery.  Patient currently having a MRI.  4:05 PM MRI results are as follows: IMPRESSION:  1. Stable lumbar fusion at L3-4 and L4-5. 2. New/progressive central disc protrusion at L1-2. 3. No significant findings at L2-3 or L5-S1.  Discussed results with Dr. Rhunette Croft.  He recommends setting the patient up with an appointment with Neurosurgery.    4:10 PM Spoke with Dr. Wynetta Emery with Neurosurgery.  He reports that the patient has been discharged from his clinic.  However, he agrees to see the patient one time since he was in the ED.  He will set the patient up with an appointment.  Patient alert and orientated x 3, Heart RRR, Lungs CTAB, Lumbar spine tender to palpation, Muscle strength lower extremities 4/5 bilaterally.  Patient able to ambulate well with walker, which he uses at baseline at home.  Feel that patient can be discharged home and follow up with Dr. Wynetta Emery.  Patient reports that he has Hydrocodone at home.  Return precautions discussed.  Pascal [place], PA-C 02/07/12 1200

## 2012-02-06 NOTE — ED Provider Notes (Signed)
Moved to CDU on Back Pain Protocol, awaiting MRI. History of back surgery 4 years ago by Dr. Wynetta Emery. He has not been seen since that time by Dr. Wynetta Emery. Back pain with progressive numbness and weakness. No incontinence. MRI ordered to evaluate for acute concern.  Rodena Medin, PA-C 02/06/12 1504

## 2012-02-06 NOTE — Telephone Encounter (Signed)
Patient is calling because his back is hurting severely and his legs are going numb.

## 2012-02-06 NOTE — ED Notes (Signed)
Pt also will be given a cup of ice, Sprite, peanut butter and graham crackers to take with him

## 2012-02-06 NOTE — ED Notes (Signed)
Called mri and they advise will probobly be an hour before they call for pt

## 2012-02-09 ENCOUNTER — Telehealth: Payer: Self-pay | Admitting: Family Medicine

## 2012-02-09 NOTE — Telephone Encounter (Signed)
Thank you! I received a message from ED provider as well. Delpha Perko M. Margaret Cockerill, M.D.

## 2012-02-09 NOTE — Telephone Encounter (Signed)
Patient called to speak to Larita Fife about what was found when he went to the ER on Friday.

## 2012-02-09 NOTE — Telephone Encounter (Signed)
Patient states he  was told in ED that he  has a bulging disk. States he is suppose to be hearing from Dr. Lonie Peak office. He just wanted Dr. Mikel Cella to know.

## 2012-02-10 NOTE — ED Provider Notes (Signed)
Medical screening examination/treatment/procedure(s) were performed by non-physician practitioner and as supervising physician I was immediately available for consultation/collaboration.  Aylah Yeary, MD 02/10/12 0019 

## 2012-02-12 ENCOUNTER — Ambulatory Visit: Payer: 59 | Admitting: Psychiatry

## 2012-02-14 ENCOUNTER — Telehealth: Payer: Self-pay | Admitting: Family Medicine

## 2012-02-14 MED ORDER — OMEPRAZOLE 40 MG PO CPDR: 40.0000 mg | DELAYED_RELEASE_CAPSULE | Freq: Every day | ORAL | Status: DC

## 2012-02-14 NOTE — Telephone Encounter (Signed)
Received call from patient that he needs refill on omeprazole.  States that he is having symptoms of reflux with sour taste when trying to sleep.  I told him I would send in 7 days of medication but needs to have PCP refill further.

## 2012-02-19 ENCOUNTER — Ambulatory Visit: Payer: Medicare Other | Admitting: Psychiatry

## 2012-02-20 ENCOUNTER — Ambulatory Visit (INDEPENDENT_AMBULATORY_CARE_PROVIDER_SITE_OTHER): Payer: Medicare Other | Admitting: Family Medicine

## 2012-02-20 ENCOUNTER — Encounter: Payer: Self-pay | Admitting: Family Medicine

## 2012-02-20 VITALS — BP 147/81 | HR 83 | Temp 97.9°F | Ht 72.0 in | Wt 320.0 lb

## 2012-02-20 DIAGNOSIS — M545 Low back pain, unspecified: Secondary | ICD-10-CM

## 2012-02-20 DIAGNOSIS — L538 Other specified erythematous conditions: Secondary | ICD-10-CM

## 2012-02-20 DIAGNOSIS — L304 Erythema intertrigo: Secondary | ICD-10-CM

## 2012-02-20 DIAGNOSIS — I10 Essential (primary) hypertension: Secondary | ICD-10-CM

## 2012-02-20 DIAGNOSIS — Z23 Encounter for immunization: Secondary | ICD-10-CM

## 2012-02-20 MED ORDER — LORATADINE 10 MG PO TABS: 10.0000 mg | ORAL_TABLET | Freq: Every day | ORAL | Status: DC

## 2012-02-20 MED ORDER — OMEPRAZOLE 40 MG PO CPDR: 40.0000 mg | DELAYED_RELEASE_CAPSULE | Freq: Every day | ORAL | Status: DC

## 2012-02-20 MED ORDER — HYDROCODONE-ACETAMINOPHEN 5-500 MG PO TABS: 1.0000 | ORAL_TABLET | ORAL | Status: DC

## 2012-02-20 MED ORDER — NYSTATIN 100000 UNIT/GM EX POWD: Freq: Four times a day (QID) | CUTANEOUS | Status: DC

## 2012-02-20 MED ORDER — HYDROCODONE-ACETAMINOPHEN 5-325 MG PO TABS: 1.0000 | ORAL_TABLET | Freq: Four times a day (QID) | ORAL | Status: DC | PRN

## 2012-02-20 MED ORDER — CYCLOBENZAPRINE HCL 5 MG PO TABS: 10.0000 mg | ORAL_TABLET | Freq: Two times a day (BID) | ORAL | Status: DC | PRN

## 2012-02-20 NOTE — Assessment & Plan Note (Signed)
Refilled Vicodin 5-325mg  today as well as Flexeril. Patient has been trying to get earlier appt with Dr. Wynetta Emery with neurosurgery. For now, continue to rest and treat symptomatically. Patient agrees.

## 2012-02-20 NOTE — Progress Notes (Signed)
Patient ID: Lance Luna, male   DOB: February 05, 1971, 42 y.o.   MRN: [id number] [facility] Lance Luna M. Lance Sahagun, MD Phone: [phone]  Subjective: HPI: Patient is a 42 y.o. male presenting to clinic today for same day appointment for follow up on back pain and elevated blood pressure.  1. Back pain- Pain is unchanged. Seen in ED and told he had bulging disc and has appt with Dr. Wynetta Emery on 03/09/12. He states he went to [facility] yesterday and had a lot of pain. Has to use a walker or cane to help. Unable to walk the dog because she pulls and hurts his back. Has not been driving until today, and this also aggravates his back. Currently taking Tramadol and Vicodin for pain. He is also taking Prednisone that was prescribed at a previous visit. Takes Flexeril as needed. Denies loss of bowel or bladder but does have pain radiating down legs.   2. HTN- BP 147/81 but he is in pain today. He is on LIsinopril/HCTZ 12-25mg  daily and doing well on this. No HA, CP, SOB, abd pain.   3. Medication refills- Needs refills on Loratadine and Omeprazole today.  4. Rash under right arm- Patient denies pain but states he has had a red rash under his arm for "a while." He states it was draining a few days ago and looked like pus. No fevers, no new soaps or deodorants.  History Reviewed: Non-smoker. Health Maintenance: UTD  ROS: Please see HPI above.  Objective: Office vital signs reviewed.  Physical Examination:  General: Awake, alert. Appears uncomfortable and still HEENT: Atraumatic, normocephalic.  Pulm: CTAB, no wheezes Cardio: RRR, no murmurs appreciated Abdomen: Obese, +BS, soft, nontender, non-distended Back: TTP lumbar spine. Limited ROM in all directions. Extremities: Erythematous rash in right axilla. No drainage or pustule noted. Some satellite lesions. Trace edema. Moves all extremities.  Neuro: Grossly intact  Assessment: 42 yo M follow up appointment  Plan: See Problem List  and After Visit Summary

## 2012-02-20 NOTE — Patient Instructions (Signed)
It was good to see you today. I am sorry your back is still hurting.  I have refilled your Vicodin and your Flexeril. Continue to take these for your pain. Please keep your appointment with Dr. Wynetta Emery on January 21.  I have also sent in a powder to use in your armpit four times daily. If it gets worse, please come back to see me. Otherwise, I will see you in one month to check up on your blood pressure.  Take care! Cleota Pellerito M. Rylin Saez, M.D.

## 2012-02-20 NOTE — Assessment & Plan Note (Signed)
Of right axilla. Will treat with Nystatin powder 4 times per day. Encouraged to keep area dry. If he notices more pus or has a lump as he described, he should come in to be seen for that.

## 2012-02-20 NOTE — Assessment & Plan Note (Signed)
BP remains elevated, but SBP at goal. Will continue current dose of LIsinopril HCTZ 20-25 and recheck in one month. If still having elevated BP may need higher dose of medication.

## 2012-02-26 ENCOUNTER — Ambulatory Visit: Payer: Medicare Other | Admitting: Psychiatry

## 2012-03-04 ENCOUNTER — Ambulatory Visit: Payer: 59 | Admitting: Psychiatry

## 2012-03-09 ENCOUNTER — Telehealth: Payer: Self-pay | Admitting: Family Medicine

## 2012-03-09 NOTE — Telephone Encounter (Signed)
Patient is calling asking if he can get a Cortisone Injecton at the Hospital.

## 2012-03-09 NOTE — Telephone Encounter (Signed)
Called pt regarding cortisone injection,per Dr Mikel Cella pt was told he needs to contact his neurosurgeon regarding cortisone injection.Pt states he had tired several times  and receives no rtn calls.I've instructed him  to try again and if he still doesn't get a hold of him to call back and we would try for him.he voiced understanding. Onia Shiflett, Virgel Bouquet

## 2012-03-11 ENCOUNTER — Ambulatory Visit (INDEPENDENT_AMBULATORY_CARE_PROVIDER_SITE_OTHER): Payer: No Typology Code available for payment source | Admitting: Psychiatry

## 2012-03-11 DIAGNOSIS — F209 Schizophrenia, unspecified: Secondary | ICD-10-CM

## 2012-03-18 ENCOUNTER — Ambulatory Visit (INDEPENDENT_AMBULATORY_CARE_PROVIDER_SITE_OTHER): Payer: No Typology Code available for payment source | Admitting: Psychiatry

## 2012-03-18 DIAGNOSIS — F209 Schizophrenia, unspecified: Secondary | ICD-10-CM

## 2012-03-25 ENCOUNTER — Ambulatory Visit (INDEPENDENT_AMBULATORY_CARE_PROVIDER_SITE_OTHER): Payer: No Typology Code available for payment source | Admitting: Psychiatry

## 2012-03-25 DIAGNOSIS — F209 Schizophrenia, unspecified: Secondary | ICD-10-CM

## 2012-04-01 ENCOUNTER — Ambulatory Visit: Payer: Self-pay | Admitting: Psychiatry

## 2012-04-08 ENCOUNTER — Ambulatory Visit: Payer: Self-pay | Admitting: Psychiatry

## 2012-04-15 ENCOUNTER — Ambulatory Visit: Payer: Self-pay | Admitting: Psychiatry

## 2012-04-22 ENCOUNTER — Ambulatory Visit (INDEPENDENT_AMBULATORY_CARE_PROVIDER_SITE_OTHER): Payer: No Typology Code available for payment source | Admitting: Psychiatry

## 2012-04-22 DIAGNOSIS — F209 Schizophrenia, unspecified: Secondary | ICD-10-CM

## 2012-04-29 ENCOUNTER — Ambulatory Visit (INDEPENDENT_AMBULATORY_CARE_PROVIDER_SITE_OTHER): Payer: No Typology Code available for payment source | Admitting: Psychiatry

## 2012-04-29 DIAGNOSIS — F209 Schizophrenia, unspecified: Secondary | ICD-10-CM

## 2012-05-01 ENCOUNTER — Telehealth: Payer: Self-pay | Admitting: Family Medicine

## 2012-05-01 NOTE — Telephone Encounter (Signed)
Pt called about back pain tonight.  Pt states he was having worse pain today.  Upon further questioning, he is not having acute pain but all of this has been ongoing since December.  No bowel or bladder incontinence, no acute weakness in his legs, no fever.  Ran out of his Vicodin and flexeril which helped control his pain several days ago.  Told pt to make an appointment Monday morning to be seen for medication refills and evaluation.  Pt stated he wanted to go to the ED but recommend he takes Tylenol 2, 325 mg pills, every 6 hours and ice the area until Monday.  Told pt to go to the ED if having Red flags including sudden urinary/bowel incontinence, new onset weakness, loss of sensation.    Twana First Paulina Fusi, DO of [facility] 05/01/2012, 8:36 PM

## 2012-05-06 ENCOUNTER — Ambulatory Visit (INDEPENDENT_AMBULATORY_CARE_PROVIDER_SITE_OTHER): Payer: No Typology Code available for payment source | Admitting: Psychiatry

## 2012-05-06 DIAGNOSIS — F209 Schizophrenia, unspecified: Secondary | ICD-10-CM

## 2012-05-10 ENCOUNTER — Telehealth: Payer: Self-pay | Admitting: Family Medicine

## 2012-05-10 DIAGNOSIS — M5126 Other intervertebral disc displacement, lumbar region: Secondary | ICD-10-CM

## 2012-05-10 NOTE — Telephone Encounter (Signed)
Patient is calling asking for a Referral to Dr. Petra Kuba instead of Dr. Wynetta Emery because Dr. Wynetta Emery won't see him anymore.  He is also asking to speak to Dr. Mikel Cella.

## 2012-05-11 NOTE — Telephone Encounter (Signed)
Pt states Dr. Dola Argyle office will not tell him why they will no longer see him.  He requests to see Dr. Petra Kuba.  Pt states his back is hurting him and would like a refill on his Flexeril( [facility] [address]  Pt has appt with Dr Mikel Cella on April 2nd.  Will forward this message to Md for further instructions.  Emilie Rutter, Darlyne Russian

## 2012-05-11 NOTE — Telephone Encounter (Signed)
Patient is calling because he hasn't heard back anything from his call from yesterday.

## 2012-05-12 DIAGNOSIS — M5126 Other intervertebral disc displacement, lumbar region: Secondary | ICD-10-CM | POA: Insufficient documentation

## 2012-05-12 MED ORDER — CYCLOBENZAPRINE HCL 5 MG PO TABS: 10.0000 mg | ORAL_TABLET | Freq: Two times a day (BID) | ORAL | Status: DC | PRN

## 2012-05-12 NOTE — Telephone Encounter (Signed)
I have placed referral for neurosurgery in the past. Will fwd to Lupita Leash to see if she can schedule a new appointment with that referral or if she needs a new one.  Will send in refill for Flexeril but no other refills until his office visit.  Thanks, [employer]. [place], M.D.

## 2012-05-12 NOTE — Telephone Encounter (Signed)
Order placed for referral for herniated disc. Also, has had previous surgery on his back and has chronic back pain with radiculopathy.   I know a Dr. Amada Jupiter but he is Neurology and not Neurosurgery.  Baxter Hire, could you please update patient that he has a refill at [facility] and we are working on getting him an appointment, but need to know the office he would like to go to?  Thanks!

## 2012-05-12 NOTE — Telephone Encounter (Signed)
You will need to put in a new referral.  Also please advise me where Dr Petra Kuba is located.  The only one I find in [place] is an Administrator, arts.  The only Dr. Petra Kuba, neurosurgeon I can find is in [place].  Ileana Ladd

## 2012-05-12 NOTE — Telephone Encounter (Signed)
Referral/records faxed to Dr. Petra Kuba, Neurosurgeon in [facility].  He will review records and call patient with an appoinment if he decides to take him on as a new patient.  Ileana Ladd

## 2012-05-13 ENCOUNTER — Ambulatory Visit (INDEPENDENT_AMBULATORY_CARE_PROVIDER_SITE_OTHER): Payer: No Typology Code available for payment source | Admitting: Psychiatry

## 2012-05-13 ENCOUNTER — Telehealth: Payer: Self-pay | Admitting: Family Medicine

## 2012-05-13 DIAGNOSIS — F209 Schizophrenia, unspecified: Secondary | ICD-10-CM

## 2012-05-13 NOTE — Telephone Encounter (Signed)
Spoke with patient and reminded him that refill at [facility].  Also, that records were faxed to Dr. Petra Kuba in [facility].  Emilie Rutter, Darlyne Russian

## 2012-05-13 NOTE — Telephone Encounter (Signed)
Patient called in stating he is waiting on a flexeril refill since Monday. State that his back pain is bad and he cannot rest. Informed patient that I will be unable to refill medication over the phone. Recommend tylenol or naproxen 500 mg or ibuprofen 600 mg q dose to tied patient over (he as normal renal function).   I informed patient that I will be sure to pass this message to his PCP.

## 2012-05-19 ENCOUNTER — Ambulatory Visit (INDEPENDENT_AMBULATORY_CARE_PROVIDER_SITE_OTHER): Payer: Medicare Other | Admitting: Family Medicine

## 2012-05-19 ENCOUNTER — Encounter: Payer: Self-pay | Admitting: Family Medicine

## 2012-05-19 VITALS — BP 118/71 | HR 71 | Temp 98.2°F | Ht 72.0 in | Wt 332.0 lb

## 2012-05-19 DIAGNOSIS — E669 Obesity, unspecified: Secondary | ICD-10-CM

## 2012-05-19 DIAGNOSIS — Z79899 Other long term (current) drug therapy: Secondary | ICD-10-CM

## 2012-05-19 DIAGNOSIS — M545 Low back pain, unspecified: Secondary | ICD-10-CM

## 2012-05-19 DIAGNOSIS — I10 Essential (primary) hypertension: Secondary | ICD-10-CM

## 2012-05-19 MED ORDER — CYCLOBENZAPRINE HCL 5 MG PO TABS: 10.0000 mg | ORAL_TABLET | Freq: Two times a day (BID) | ORAL | Status: DC | PRN

## 2012-05-19 MED ORDER — GABAPENTIN 600 MG PO TABS: 1200.0000 mg | ORAL_TABLET | Freq: Three times a day (TID) | ORAL | Status: DC

## 2012-05-19 MED ORDER — LISINOPRIL-HYDROCHLOROTHIAZIDE 20-25 MG PO TABS: 1.0000 | ORAL_TABLET | Freq: Every day | ORAL | Status: DC

## 2012-05-19 MED ORDER — OMEPRAZOLE 40 MG PO CPDR: 40.0000 mg | DELAYED_RELEASE_CAPSULE | Freq: Every day | ORAL | Status: DC

## 2012-05-19 MED ORDER — TRAMADOL HCL 50 MG PO TABS: 50.0000 mg | ORAL_TABLET | Freq: Three times a day (TID) | ORAL | Status: DC | PRN

## 2012-05-19 MED ORDER — LORATADINE 10 MG PO TABS: 10.0000 mg | ORAL_TABLET | Freq: Every day | ORAL | Status: AC

## 2012-05-19 NOTE — Assessment & Plan Note (Signed)
BP stable. Will continue Lisinopril-HCTZ. Labs reviewed, needs lipid panel which was ordered for the morning. Last labs printed for Dr. Sharyn Lull.

## 2012-05-19 NOTE — Progress Notes (Signed)
Patient Identified Concern:  Weight loss Stage of Change Patient Is In:  Contemplation, willing to make changes in next 6 months Patient Reported Barriers:  Eating out with friends, all you can eat buffetts Patient Reported Perceived Benefits:  Feeling better, losing weight Patient Reports Self-Efficacy:   Pt display some self efficacy with past weight loss success Behavior Change Supports:  To be determined Goals:  Only eat 1 plate of food during meal time, eat fruits and vegetables as snacks, drink water first then diet soda if needed. Patient Education:  We talked about portion sizes in relation to weight loss.

## 2012-05-19 NOTE — Progress Notes (Signed)
Patient ID: Lance Luna, male   DOB: April 19, 1970, 42 y.o.   MRN: [id number]  [name] Kenneth Lax M. Renessa Wellnitz, MD Phone: [phone]   Subjective: HPI: Patient is a 42 y.o. male presenting to clinic today for follow up appointment. Concerns today include back pain.  1. Back pain- Chronic back pain. Was seen by Dr. Wynetta Emery and now attempting to get appointment with Dr. Petra Kuba in [facility]. (Referral has been sent, and pt has dropped off MRI at the clinic.) Had epidural injections in Feb by Dr. Abigail Miyamoto which did not help him and he is not interested in getting more. Has seen chiropractor which made the pain worse. Pain in lower back and down left leg. No falls, no incontinence.    2.. Hypertension- Blood pressure at home: Does not check Blood pressure today: 118/71 Taking Meds: Yes, needs refills. Wears CPAP nightly Side effects: None. ROS: Denies headache, visual changes, nausea, vomiting, chest pain, abdominal pain or shortness of breath.  3. Weight gain- Would like to see Dr. Gerilyn Pilgrim and Arlys John to be pushed to lose weight. Had lost some weight, but now gained. Pt states he would like to be 280. States he had meatballs for breakfast.  History Reviewed: Non-smoker. Health Maintenance: UTD, but needs lipid profile (did not come back for last labs because the order was canceled somehow)  ROS: Please see HPI above.  Objective: Office vital signs reviewed. BP 118/71  Pulse 71  Temp(Src) 98.2 F (36.8 C) (Oral)  Ht 6' (1.829 m)  Wt 332 lb (150.594 kg)  BMI 45.02 kg/m2  Physical Examination:  General: Awake, alert. NAD. Lying on right side on exam table HEENT: Atraumatic, normocephalic. Wearing glasses Pulm: CTAB, no wheezes Cardio: RRR, no murmurs appreciated Abdomen:Obese,+BS, firm but nondistended, nontender Extremities: No edema. Moves all extremities. Back: TTP lumbar paraspinal but improved from 3 months ago. Old surgical site midline Neuro:  Grossly intact  Assessment: 42 y.o. male follow up appointment  Plan: See Problem List and After Visit Summary

## 2012-05-19 NOTE — Addendum Note (Signed)
Addended by: Hilarie Fredrickson on: 05/19/2012 01:40 PM   Modules accepted: Orders

## 2012-05-19 NOTE — Assessment & Plan Note (Signed)
Patient concerned about his weight. Lance Luna asked to see patient while he is here today to set goals. He is appreciative of this. Also given Dr. Gerilyn Pilgrim number to get back in touch with her. He has been seen previously. Patient makes poor food choices, likely secondary to poor insight but he takes direction well. He will likely need a lot of encouragement to have successful weight loss. Of note, his multiple psych medication may also contribute to his weight.

## 2012-05-19 NOTE — Patient Instructions (Addendum)
It was good to see you! I am glad your back is a little better. I hope Dr. Petra Kuba is able to see you soon.  Please make an appointment to come back in the morning before breakfast to check your cholesterol.  I have given you Dr. Gerilyn Pilgrim number to call her, and Arlys John.  I will see you back in 2 months, or sooner if you need anything.  Amber M. Hairford, M.D.

## 2012-05-19 NOTE — Assessment & Plan Note (Signed)
Awaiting evaluation by Dr. Petra Kuba. Patient hopeful for answers. For now, continue home exercises and Flexeril as needed. No further imaging at this time.

## 2012-05-20 ENCOUNTER — Other Ambulatory Visit: Payer: Medicare Other

## 2012-05-20 ENCOUNTER — Ambulatory Visit: Payer: No Typology Code available for payment source | Admitting: Psychiatry

## 2012-05-20 DIAGNOSIS — Z79899 Other long term (current) drug therapy: Secondary | ICD-10-CM

## 2012-05-20 LAB — LIPID PANEL
Cholesterol: 165 mg/dL (ref 0–200)
Total CHOL/HDL Ratio: 5.2 Ratio
Triglycerides: 115 mg/dL (ref <150)
VLDL: 23 mg/dL (ref 0–40)

## 2012-05-20 NOTE — Progress Notes (Signed)
FLP DONE TODAY Britney Newstrom 

## 2012-05-26 ENCOUNTER — Encounter: Payer: Self-pay | Admitting: Family Medicine

## 2012-05-27 ENCOUNTER — Ambulatory Visit (INDEPENDENT_AMBULATORY_CARE_PROVIDER_SITE_OTHER): Payer: No Typology Code available for payment source | Admitting: Psychiatry

## 2012-05-27 DIAGNOSIS — F209 Schizophrenia, unspecified: Secondary | ICD-10-CM

## 2012-06-03 ENCOUNTER — Ambulatory Visit (INDEPENDENT_AMBULATORY_CARE_PROVIDER_SITE_OTHER): Payer: No Typology Code available for payment source | Admitting: Psychiatry

## 2012-06-03 DIAGNOSIS — F209 Schizophrenia, unspecified: Secondary | ICD-10-CM

## 2012-06-10 ENCOUNTER — Ambulatory Visit (INDEPENDENT_AMBULATORY_CARE_PROVIDER_SITE_OTHER): Payer: No Typology Code available for payment source | Admitting: Psychiatry

## 2012-06-10 ENCOUNTER — Telehealth: Payer: Self-pay | Admitting: Home Health Services

## 2012-06-10 DIAGNOSIS — F209 Schizophrenia, unspecified: Secondary | ICD-10-CM

## 2012-06-10 NOTE — Telephone Encounter (Signed)
Spoke with Onalee Hua Pt reports feeling: good Pt reports taking medications yes Patient missed taking medications 1 days this week.   Pt is self monitoring their HTN:no not doing  Last weeks goals:eat 1 plate of food during meal times Pt was successful with last week's goals: yes This weeks goals: 1 plate of food at meal time, walk 2x this week, take medications every dya. Pt's overall goal is: Weight loss, htn management.

## 2012-06-17 ENCOUNTER — Ambulatory Visit: Payer: No Typology Code available for payment source | Admitting: Psychiatry

## 2012-06-24 ENCOUNTER — Ambulatory Visit (INDEPENDENT_AMBULATORY_CARE_PROVIDER_SITE_OTHER): Payer: No Typology Code available for payment source | Admitting: Psychiatry

## 2012-06-24 ENCOUNTER — Telehealth: Payer: Self-pay | Admitting: Home Health Services

## 2012-06-24 DIAGNOSIS — F209 Schizophrenia, unspecified: Secondary | ICD-10-CM

## 2012-06-24 NOTE — Telephone Encounter (Signed)
Spoke with Onalee Hua Pt reports feeling: fair  Reports back is hurting him Pt reports taking medications yes Patient missed taking medications 0 days this week.   Last weeks goals:1 plate of food, walk 2 x this past week Pt was successful with last week's goals: no This weeks goals: 1 plate of food, walk 2x outside this next week, take all medications as prescribed.  Pt lack basic understanding of healthy meal planning.  Pt's overall goal is: Weight loss

## 2012-06-29 ENCOUNTER — Encounter: Payer: Self-pay | Admitting: Family Medicine

## 2012-06-29 ENCOUNTER — Ambulatory Visit (INDEPENDENT_AMBULATORY_CARE_PROVIDER_SITE_OTHER): Payer: Medicare Other | Admitting: Family Medicine

## 2012-06-29 VITALS — Ht 72.0 in | Wt 332.4 lb

## 2012-06-29 DIAGNOSIS — I10 Essential (primary) hypertension: Secondary | ICD-10-CM

## 2012-06-29 DIAGNOSIS — E785 Hyperlipidemia, unspecified: Secondary | ICD-10-CM

## 2012-06-29 DIAGNOSIS — E669 Obesity, unspecified: Secondary | ICD-10-CM

## 2012-06-29 NOTE — Patient Instructions (Addendum)
-   Think twice before you have sweets or desserts.  These are what we call, "SOMETIMES" foods instead of "EVERYDAY" foods.   - EVERYDAY foods include:  Fruit  Vegetables  Meat or other protein foods like eggs, poultry, fish, beans GOALS:  - Include vegetables at each lunch and dinner.   - When using butter, use only about one teaspoon; when using salad dressing, use only 1 tbsp (MEASURE!). - At each meal: One serving only of each item (no second helpings).  - Walk as you are able.  If it hurts too much, DON'T walk.    - Make an appt with Dr. Mikel Cella.

## 2012-06-29 NOTE — Progress Notes (Signed)
Medical Nutrition Therapy:  Appt start time: 1330 end time:  1430.  Assessment:  Primary concerns today: Weight management and HTN, hyperlipidemia (metabolic syndrome).  Lance Luna has been working on having only one serving of food at [employer], but he has been variably successful at this.  He did take part of his restaurant meal home yesterday, which he had for his evening meal.  A lot (most?) of his meals are still restaurant or takeout foods.  He said having health coach Arlys John call him weekly has been helpful in reminding him of his goals.   We reviewed [name]'s intake yesterday, and when asked what he might do differently for healthier choices, he said he would omit the donut, but he said he would not eliminate the ice cream cone b/c that was too good to give up.  He did recognize that a way to improve today's lunch would be to include vegetables with it.    24-hr recall:  (UP at 5:30 AM) B (7 AM)-  [employer] egg & sausage biscuit, 1 c coffee w/ 1 Sweet 'n Low & creamers, water Snk (10 AM)-  1 KK glazed donut L (12 PM)-  1 slc meatloaf, <1/2 c coleslaw, <1/2 c baked beans, 6-7 hushpuppies, diet Coke Snk ( PM)-  none D ( PM)-  Ditto lunch, but a little less, sugar-free Kool-Aid Snk ( PM)-  [employer] choc dip ice cream   Today's meals: (UP at 8 AM) B (8:30 AM)-  3 scrmbld eggs in canola spray, 1 c coffee Snk ( AM)-  none L (12 PM)-  2 small potatoes w/ 1 tbsp butter, 2 [place] burger patties, sugar-free Kool-Aid   Progress Towards Goal(s):  No progress.   Nutritional Diagnosis:  NB-1.7 Undesireable food choices As related to imbalanced diet.  As evidenced by usual intake of fruits and veg's only once a week. NB-2.1 Physical inactivity As related to intake.  As evidenced by usual physical activity limited to walking 5-10 min a couple times a week.    Intervention:  Nutrition education.  Monitoring/Evaluation:  Dietary intake, exercise, and body weight in 3 weeks.

## 2012-07-01 ENCOUNTER — Telehealth: Payer: Self-pay | Admitting: Home Health Services

## 2012-07-01 ENCOUNTER — Ambulatory Visit (INDEPENDENT_AMBULATORY_CARE_PROVIDER_SITE_OTHER): Payer: No Typology Code available for payment source | Admitting: Psychiatry

## 2012-07-01 DIAGNOSIS — F209 Schizophrenia, unspecified: Secondary | ICD-10-CM

## 2012-07-01 NOTE — Telephone Encounter (Signed)
Spoke with Lance Luna Pt reports feeling: fair Pt reports taking medications yes Patient missed taking medications 0 days this week.   Pt reports feeling down because his apartment complex is asking him to pay for new carpet and he doesn't have the money.  Pt seem vague about how well he really did this week with his goals. Reports that he did okay.   Last weeks goals:1 plate of food, vegetables at lunch and dinner, using little added fats to foods. Pt was successful with last week's goals: yes This weeks goals: continue with above goals  Pt's overall goal is: weight loss.

## 2012-07-08 ENCOUNTER — Ambulatory Visit (INDEPENDENT_AMBULATORY_CARE_PROVIDER_SITE_OTHER): Payer: No Typology Code available for payment source | Admitting: Psychiatry

## 2012-07-08 DIAGNOSIS — F209 Schizophrenia, unspecified: Secondary | ICD-10-CM

## 2012-07-09 ENCOUNTER — Encounter: Payer: Self-pay | Admitting: Family Medicine

## 2012-07-09 ENCOUNTER — Ambulatory Visit (INDEPENDENT_AMBULATORY_CARE_PROVIDER_SITE_OTHER): Payer: Medicare Other | Admitting: Family Medicine

## 2012-07-09 VITALS — BP 144/86 | HR 65 | Temp 98.2°F | Ht 72.0 in | Wt 332.3 lb

## 2012-07-09 DIAGNOSIS — E669 Obesity, unspecified: Secondary | ICD-10-CM

## 2012-07-09 DIAGNOSIS — M545 Low back pain, unspecified: Secondary | ICD-10-CM

## 2012-07-09 DIAGNOSIS — L408 Other psoriasis: Secondary | ICD-10-CM

## 2012-07-09 MED ORDER — CYCLOBENZAPRINE HCL 5 MG PO TABS: 10.0000 mg | ORAL_TABLET | Freq: Two times a day (BID) | ORAL | Status: DC | PRN

## 2012-07-09 MED ORDER — TRAMADOL HCL 50 MG PO TABS: 50.0000 mg | ORAL_TABLET | Freq: Three times a day (TID) | ORAL | Status: DC | PRN

## 2012-07-09 NOTE — Patient Instructions (Signed)
It was good to see you today.  Use ointment on your dry skin, and keep it moisturized.  Please make a follow up appointment and bring your disc, I would love to see the pictures of your back!  Delonda Coley M. Gracemarie Skeet, M.D.

## 2012-07-09 NOTE — Assessment & Plan Note (Signed)
Followed by neuro. Will bring films for our records. Refilled Tramadol and Flexeril but encouraged to decrease use as much as possible. Pt agrees.

## 2012-07-09 NOTE — Progress Notes (Signed)
Patient ID: Lance Luna, male   DOB: 29-Sep-1970, 42 y.o.   MRN: [id number]  [facility] Inika Bellanger M. Masako Overall, MD Phone: [phone]   Subjective: HPI: Patient is a 42 y.o. male presenting to clinic today for follow up appointment. Concerns today include medication refills  1. Back pain- Followed by Dr. Petra Kuba in [facility]. Had Myelogram done that caused some cramping in his left leg. Has nerve testing scheduled on June 2. Follows up with her on June 3. Back is about the same. Better with laying down, but worse with movement. Takes Tramadol and Flexeril which help but he needs refills.  2. Psoriasis On his hands and legs. Wants to start using some honey medicine. Been scratching it a lot, but not keeping it moisturizing.    3. Obesity- Followed by Arlys John, health coach. [facility] Going to class this week to learn how to cook diet food. He was put in touch with this program by his case worker. Feeling depressed lately, but is talking to his counselor about this.   History Reviewed: Non smoker.  ROS: Please see HPI above.  Objective: Office vital signs reviewed. BP 144/86  Pulse 65  Temp(Src) 98.2 F (36.8 C) (Oral)  Ht 6' (1.829 m)  Wt 332 lb 4.8 oz (150.73 kg)  BMI 45.06 kg/m2  Physical Examination:  General: Awake, alert. NAD. Talkative. HEENT: Atraumatic, normocephalic. MMM. Wearing glasses Pulm: CTAB, no wheezes Cardio: RRR, no murmurs appreciated Abdomen:Obese, +BS, soft, nontender, nondistended Extremities: No edema. Dry, scaly skin on all knuckles of right hand and left thumb. Also has 3 cm area on lateral right calf with deep excoriations that appears to have been recently opened and oozing. No surrounding erythema. Neuro: Grossly intact  Assessment: 42 y.o. male follow upa ppointment  Plan: See Problem List and After Visit Summary

## 2012-07-09 NOTE — Assessment & Plan Note (Signed)
Continued to encourage weight loss and healthy lifestyle. Appreciate Suzanne's help with this also.

## 2012-07-09 NOTE — Assessment & Plan Note (Signed)
Leg wound wrapped with bacitracin and gauze to protect while on city transportation. Encouraged to keep it moisturized and covered. Pt agrees. Return if he develops more pain, fever or signs or infection.

## 2012-07-11 ENCOUNTER — Telehealth: Payer: Self-pay | Admitting: Emergency Medicine

## 2012-07-11 NOTE — Telephone Encounter (Signed)
Emergency Line Call He reports a "dizzy spell" since yesterday.  Described as the room spinning whenever he stands up.  He states that this is like the vertigo he has had in the past.  Also with a left sided headache.  No nausea.  He is unable to get up and move around much due to the vertigo.  I recommended that he go to the ED for evaluation.  Specifically told him NOT to drive himself.  He expressed understanding and agreement with this plan.

## 2012-07-13 ENCOUNTER — Telehealth: Payer: Self-pay | Admitting: Family Medicine

## 2012-07-13 NOTE — Telephone Encounter (Signed)
Pt has CC appt at 8:30 am 07/14/2012.  Will fwd to MD for advice. Emilie Rutter, Darlyne Russian, CMA

## 2012-07-13 NOTE — Telephone Encounter (Signed)
Requested Prescriptions   Pending Prescriptions Disp Refills  . cyclobenzaprine (FLEXERIL) 5 MG tablet 60 tablet 2    Sig: Take 2 tablets (10 mg total) by mouth 2 (two) times daily as needed. For muscle spasms

## 2012-07-13 NOTE — Telephone Encounter (Signed)
Patient should keep his appointment, or go to the ER if it worsens. (He was given this advice over the weekend as well.) Please advise patient to please not drive himself if he is feeling dizzy.   Lance Luna, M.D.

## 2012-07-13 NOTE — Telephone Encounter (Signed)
Pt has been having dizzy spells since Saturday. Gets dizzy when he gets up. Has appt tomorrow but wants to know what can be done in the meantime Please advise

## 2012-07-14 ENCOUNTER — Telehealth: Payer: Self-pay | Admitting: *Deleted

## 2012-07-14 ENCOUNTER — Encounter: Payer: Self-pay | Admitting: Family Medicine

## 2012-07-14 ENCOUNTER — Ambulatory Visit (INDEPENDENT_AMBULATORY_CARE_PROVIDER_SITE_OTHER): Payer: Medicare Other | Admitting: Family Medicine

## 2012-07-14 VITALS — BP 130/86 | HR 70 | Temp 98.0°F | Ht 72.0 in | Wt 330.0 lb

## 2012-07-14 DIAGNOSIS — R42 Dizziness and giddiness: Secondary | ICD-10-CM

## 2012-07-14 MED ORDER — MECLIZINE HCL 25 MG PO TABS: 25.0000 mg | ORAL_TABLET | Freq: Three times a day (TID) | ORAL | Status: DC | PRN

## 2012-07-14 NOTE — Progress Notes (Signed)
Patient ID: Lance Luna, male   DOB: 1970-02-20, 42 y.o.   MRN: [id number]  [name] Brocha Gilliam M. Kresta Templeman, MD Phone: [phone]   Subjective: HPI: Patient is a 42 y.o. male presenting to clinic today for same day appointment for dizzy spells.  Vertigo Patient presents for evaluation of dizziness. The symptoms started several days ago and have waxed and waned but are worse overall. The attacks occur frequently and last a few minutes. Positions that worsen symptoms: bending over and standing up. Previous workup/treatments: none. Associated ear symptoms: none. Associated CNS symptoms: none. Recent infections: none. Head trauma: denied. Drug ingestion: none. Noise exposure: no occupational exposure. Family history: Negative. Patient has a personal experience of vertigo many years ago and had to go to PT.  Patient states his eyes are moving and the room is spinning around him.  History Reviewed: Non smoker.  ROS: Please see HPI above.  Objective: Office vital signs reviewed. There were no vitals taken for this visit.  Physical Examination:  General: Awake, alert. NAD. SItting up on table but closes eyes frequently. HEENT: Atraumatic, normocephalic. Pupils equal, round and reactive. EOM intact without obvious nystagmus Neck: Large neck. No masses palpated. No LAD Pulm: CTAB, no wheezes Cardio: RRR, no murmurs appreciated Neuro: Grossly intact. No cranial nerve deficits  Assessment: 42 y.o. male with dizziness  Plan: See Problem List and After Visit Summary

## 2012-07-14 NOTE — Assessment & Plan Note (Signed)
Most likely benign vertigo. No red flag symptoms noted today. Given Meclizine and encouraged to go home to rest. Instructed not to drive! Return in 48-72 hours if he is not improving to further investigate causes of his vertigo. PT helped in the past and may be an option again for vestibular rehab.

## 2012-07-14 NOTE — Telephone Encounter (Signed)
Received prior authorization call from [facility]  for Flexeril. Will place form in box when it is received via fax.Wyatt Haste, RN-BSN

## 2012-07-14 NOTE — Patient Instructions (Signed)
Vertigo  Vertigo means you feel like you are moving when you are not. Vertigo can make you feel like things around you are moving when they are not. This problem often goes away on its own.   HOME CARE   · Follow your doctor's instructions.  · Avoid driving.  · Avoid using heavy machinery.  · Avoid doing any activity that could be dangerous if you have a vertigo attack.  · Tell your doctor if a medicine seems to cause your vertigo.  GET HELP RIGHT AWAY IF:   · Your medicines do not help or make you feel worse.  · You have trouble talking or walking.  · You feel weak or have trouble using your arms, hands, or legs.  · You have bad headaches.  · You keep feeling sick to your stomach (nauseous) or throwing up (vomiting).  · Your vision changes.  · A family member notices changes in your behavior.  · Your problems get worse.  MAKE SURE YOU:  · Understand these instructions.  · Will watch your condition.  · Will get help right away if you are not doing well or get worse.  Document Released: 11/13/2007 Document Revised: 04/28/2011 Document Reviewed: 08/22/2010  ExitCare® Patient Information ©2014 [employer].

## 2012-07-15 ENCOUNTER — Ambulatory Visit: Payer: No Typology Code available for payment source | Admitting: Psychiatry

## 2012-07-15 MED ORDER — BACLOFEN 10 MG PO TABS: 10.0000 mg | ORAL_TABLET | Freq: Three times a day (TID) | ORAL | Status: DC

## 2012-07-15 NOTE — Addendum Note (Signed)
Addended by: Hilarie Fredrickson on: 07/15/2012 10:47 AM   Modules accepted: Orders

## 2012-07-15 NOTE — Telephone Encounter (Signed)
Rx changed to Baclofen. Sent to [facility].  Zimere Dunlevy M. Emaree Chiu, M.D.

## 2012-07-22 ENCOUNTER — Ambulatory Visit: Payer: No Typology Code available for payment source | Admitting: Psychiatry

## 2012-07-27 ENCOUNTER — Ambulatory Visit (INDEPENDENT_AMBULATORY_CARE_PROVIDER_SITE_OTHER): Payer: Medicare Other | Admitting: Family Medicine

## 2012-07-27 VITALS — Ht 70.0 in | Wt 327.8 lb

## 2012-07-27 DIAGNOSIS — I10 Essential (primary) hypertension: Secondary | ICD-10-CM

## 2012-07-27 DIAGNOSIS — E669 Obesity, unspecified: Secondary | ICD-10-CM

## 2012-07-27 DIAGNOSIS — E785 Hyperlipidemia, unspecified: Secondary | ICD-10-CM

## 2012-07-27 NOTE — Patient Instructions (Addendum)
-   Mayonnaise:  Look for low-fat, not fat-free.  - Try to keep some frozen vegetables on hand.   - With any frozen vegetables, you can microwave them in a bowl, with a plate on top.  - Goal:  NO seconds at meals.    - When eating out, plan on taking some home EACH time.    - Ask to box up half the food before you even start eating.   - Goal:  Always include a vegetable of fruit with lunch and dinner.   - Ask Dr. Mikel Cella what you should do and NOT do for physical activity.

## 2012-07-27 NOTE — Progress Notes (Signed)
Medical Nutrition Therapy:  Appt start time: 1330 end time:  1430.  Assessment:  Primary concerns today: Weight management and HTN, hyperlipidemia (metabolic syndrome).  Delmore siad he has been trying to eat less, only 1 serving of each food.  He has made some of his own meals, fish, [place] burgers.  [facility], and would like to go back again.  He is walking ~60 min ~1 X day.    24-hr recall:  (UP at 8 AM) B (8:30 AM)-  2 deer sausage, diet Coke Back to bed...till 11:30 Snk ( AM)-  none L (3 PM)-  1 McD hamburger, sundae, water Snk ( PM)-  none D (5 PM)-  4 c spaghetti w/ meat sauce, 4 slc garlic bread, diet Coke Snk ( PM)-  none  Progress Towards Goal(s):  No progress.   Nutritional Diagnosis:  NB-1.7 Undesireable food choices As related to imbalanced diet.  As evidenced by usual intake of fruits and veg's only once a week. NB-2.1 Physical inactivity As related to intake.  As evidenced by usual physical activity limited to walking 5-10 min a couple times a week.    Intervention:  Nutrition education.  Monitoring/Evaluation:  Dietary intake, exercise, and body weight in 3 weeks.

## 2012-07-29 ENCOUNTER — Ambulatory Visit (INDEPENDENT_AMBULATORY_CARE_PROVIDER_SITE_OTHER): Payer: No Typology Code available for payment source | Admitting: Psychiatry

## 2012-07-29 DIAGNOSIS — F209 Schizophrenia, unspecified: Secondary | ICD-10-CM

## 2012-08-03 ENCOUNTER — Ambulatory Visit (INDEPENDENT_AMBULATORY_CARE_PROVIDER_SITE_OTHER): Payer: Medicare Other | Admitting: Family Medicine

## 2012-08-03 ENCOUNTER — Encounter: Payer: Self-pay | Admitting: Family Medicine

## 2012-08-03 VITALS — BP 149/82 | HR 70 | Temp 98.3°F | Wt 333.0 lb

## 2012-08-03 DIAGNOSIS — M545 Low back pain, unspecified: Secondary | ICD-10-CM

## 2012-08-03 DIAGNOSIS — E669 Obesity, unspecified: Secondary | ICD-10-CM

## 2012-08-03 MED ORDER — BACLOFEN 10 MG PO TABS: 10.0000 mg | ORAL_TABLET | Freq: Two times a day (BID) | ORAL | Status: DC

## 2012-08-03 NOTE — Assessment & Plan Note (Signed)
Doing well with direction from Dr. Ardeen Garland and Arlys John. Encouraged to keep up the good work. Would benefit from an exercise routine. Would be nice to do non-weight bearing exercises such as exercise bike, water aerobics or chair exercises.

## 2012-08-03 NOTE — Progress Notes (Signed)
Patient ID: Lance Luna, male   DOB: 10-31-1970, 42 y.o.   MRN: [id number]  [facility] Lance Luna M. Lance Littlepage, MD Phone: [phone]   Subjective: HPI: Patient is a 42 y.o. male presenting to clinic today for follow up appointment.   1. Lifestyle changes- Followed closely by Lance Luna as well as Lance Luna. He has been making changes, and states he is trying to cut back on how much he is eating. Weight today is 333, which is about the same as he has been for the last few months. He is going to a weight loss class in town, which he states is good. Today he had fish, french fries (he made at home), and a tomato sandwich with light mayo. Not currently exercising other than walking up and down steps at home. Encouraged to go to the [facility] which he is a member of.   2. Back pain- Followed by neurosurgery in [place]. I have reviewed in Myelogram and back CT scan. Will scan these reports into his chart. Has not been back to see Lance Luna yet, but plans to go next month. Has good days and bad days. Taking Tramadol, but has not picked up the Baclofen yet. Will reorder this today. Encouraged to do gentle stretches but no heavy lifting. Patient is concerned about having to move furniture to put in a new floor.   History Reviewed: Non-smoker. Health Maintenance: UTD  ROS: Please see HPI above.  Objective: Office vital signs reviewed. There were no vitals taken for this visit.  Physical Examination:  General: Awake, alert. NAD. Very pleasant Pulm: CTAB, no wheezes. Good effort Cardio: RRR, no murmurs appreciated Abdomen: obese, soft, nontender, nondistended Back: Very mild TTP of lumbar spine today. Walking and moving well. Extremities: No edema, moves all extremities Neuro: Grossly intact  Assessment: 42 y.o. male follow up appointment  Plan: See Problem List and After Visit Summary

## 2012-08-03 NOTE — Patient Instructions (Addendum)
It was good to see you today. I am glad things are going well. Keep up the good work with your diet. Try to exercise as much as possible. [facility] you.  I have resent your prescription for Baclofen for your back to use as needed.  I will see you back in a few months for a follow up and lab work.  Take care! Malachi Suderman M. Chapel Silverthorn, M.D.

## 2012-08-03 NOTE — Assessment & Plan Note (Addendum)
Continue Tramadol. Flexeril no longer covered by insurance, changed to Baclofen which he has not picked up yet. Continue exercising. F/u with neurosurgery prn. Since patient has high risk of injury with heavy lifting, he should not move furniture. He was given a letter stating this.

## 2012-08-05 ENCOUNTER — Ambulatory Visit (INDEPENDENT_AMBULATORY_CARE_PROVIDER_SITE_OTHER): Payer: No Typology Code available for payment source | Admitting: Psychiatry

## 2012-08-05 DIAGNOSIS — F209 Schizophrenia, unspecified: Secondary | ICD-10-CM

## 2012-08-12 ENCOUNTER — Ambulatory Visit (INDEPENDENT_AMBULATORY_CARE_PROVIDER_SITE_OTHER): Payer: No Typology Code available for payment source | Admitting: Psychiatry

## 2012-08-12 ENCOUNTER — Telehealth: Payer: Self-pay | Admitting: Home Health Services

## 2012-08-12 DIAGNOSIS — F209 Schizophrenia, unspecified: Secondary | ICD-10-CM

## 2012-08-12 NOTE — Telephone Encounter (Signed)
Spoke with Onalee Hua Pt reports feeling: good Pt reports taking medications yes Patient missed taking medications 0 days this week.   Pt is self monitoring their HTN:no not doing  Last weeks goals:1 plate of food, fruit and vegetables at least 1x a day, daily exercise Pt was successful with last week's goals: Pt reports only eating 1 plate of food, eats vegetables every few days and reports no exercises (back pain, hot weather) This weeks goals: continue from last week. Pt's overall goal is: weight loss

## 2012-08-19 ENCOUNTER — Ambulatory Visit: Payer: No Typology Code available for payment source | Admitting: Psychiatry

## 2012-08-24 ENCOUNTER — Ambulatory Visit: Payer: Self-pay | Admitting: Family Medicine

## 2012-08-26 ENCOUNTER — Ambulatory Visit (INDEPENDENT_AMBULATORY_CARE_PROVIDER_SITE_OTHER): Payer: No Typology Code available for payment source | Admitting: Psychiatry

## 2012-08-26 DIAGNOSIS — F209 Schizophrenia, unspecified: Secondary | ICD-10-CM

## 2012-09-01 NOTE — Telephone Encounter (Signed)
error 

## 2012-09-02 ENCOUNTER — Ambulatory Visit (INDEPENDENT_AMBULATORY_CARE_PROVIDER_SITE_OTHER): Payer: No Typology Code available for payment source | Admitting: Psychiatry

## 2012-09-02 DIAGNOSIS — F209 Schizophrenia, unspecified: Secondary | ICD-10-CM

## 2012-09-09 ENCOUNTER — Ambulatory Visit: Payer: No Typology Code available for payment source | Admitting: Psychiatry

## 2012-09-16 ENCOUNTER — Ambulatory Visit (INDEPENDENT_AMBULATORY_CARE_PROVIDER_SITE_OTHER): Payer: No Typology Code available for payment source | Admitting: Psychiatry

## 2012-09-16 DIAGNOSIS — F209 Schizophrenia, unspecified: Secondary | ICD-10-CM

## 2012-09-23 ENCOUNTER — Ambulatory Visit (INDEPENDENT_AMBULATORY_CARE_PROVIDER_SITE_OTHER): Payer: No Typology Code available for payment source | Admitting: Psychiatry

## 2012-09-23 DIAGNOSIS — F209 Schizophrenia, unspecified: Secondary | ICD-10-CM

## 2012-09-30 ENCOUNTER — Telehealth: Payer: Self-pay | Admitting: Home Health Services

## 2012-09-30 ENCOUNTER — Ambulatory Visit (INDEPENDENT_AMBULATORY_CARE_PROVIDER_SITE_OTHER): Payer: No Typology Code available for payment source | Admitting: Psychiatry

## 2012-09-30 DIAGNOSIS — F209 Schizophrenia, unspecified: Secondary | ICD-10-CM

## 2012-09-30 NOTE — Telephone Encounter (Signed)
Spoke with Lance Luna.  Pt reported feeling good.  Reports having a new apartment that he is happy with. Pt reports taking medications most days but misses a few.  Pt reports no regular exercise routine.  Pt reports doing well with portion control (1 plate of food).    Pt set goal to walk 5x this next week for 15 minutes around his neighborhood.  He will follow up with health coach in 1 weeks with progress.  Pt's overall goal is weight loss.

## 2012-10-07 ENCOUNTER — Telehealth: Payer: Self-pay | Admitting: Home Health Services

## 2012-10-07 ENCOUNTER — Ambulatory Visit (INDEPENDENT_AMBULATORY_CARE_PROVIDER_SITE_OTHER): Payer: No Typology Code available for payment source | Admitting: Psychiatry

## 2012-10-07 DIAGNOSIS — F209 Schizophrenia, unspecified: Secondary | ICD-10-CM

## 2012-10-07 NOTE — Telephone Encounter (Signed)
Spoke with Onalee Hua.  Pt reports feeling okay.    Pt reports walking 10-15 minutes daily for the past week.  Pt reports watching portion sizes and trying to eat only 1 plate of food at meal time.   The goals for this next week are to continue with daily walking and portion control.  Pt's overall goal is weight loss.

## 2012-10-14 ENCOUNTER — Ambulatory Visit: Payer: No Typology Code available for payment source | Admitting: Psychiatry

## 2012-10-21 ENCOUNTER — Ambulatory Visit: Payer: Self-pay | Admitting: Family Medicine

## 2012-10-21 ENCOUNTER — Ambulatory Visit: Payer: No Typology Code available for payment source | Admitting: Psychiatry

## 2012-10-28 ENCOUNTER — Ambulatory Visit: Payer: No Typology Code available for payment source | Admitting: Psychiatry

## 2012-11-02 ENCOUNTER — Ambulatory Visit: Payer: Self-pay | Admitting: Family Medicine

## 2012-11-04 ENCOUNTER — Ambulatory Visit (INDEPENDENT_AMBULATORY_CARE_PROVIDER_SITE_OTHER): Payer: No Typology Code available for payment source | Admitting: Psychiatry

## 2012-11-04 ENCOUNTER — Encounter: Payer: Self-pay | Admitting: Family Medicine

## 2012-11-04 ENCOUNTER — Ambulatory Visit (INDEPENDENT_AMBULATORY_CARE_PROVIDER_SITE_OTHER): Payer: Medicare Other | Admitting: Family Medicine

## 2012-11-04 VITALS — BP 121/81 | HR 78 | Temp 98.1°F | Wt 324.0 lb

## 2012-11-04 DIAGNOSIS — F209 Schizophrenia, unspecified: Secondary | ICD-10-CM

## 2012-11-04 DIAGNOSIS — G44229 Chronic tension-type headache, not intractable: Secondary | ICD-10-CM | POA: Insufficient documentation

## 2012-11-04 DIAGNOSIS — R519 Headache, unspecified: Secondary | ICD-10-CM

## 2012-11-04 DIAGNOSIS — R51 Headache: Secondary | ICD-10-CM

## 2012-11-04 MED ORDER — KETOROLAC TROMETHAMINE 10 MG PO TABS: 10.0000 mg | ORAL_TABLET | Freq: Four times a day (QID) | ORAL | Status: DC | PRN

## 2012-11-04 NOTE — Assessment & Plan Note (Signed)
Neurologically intact. Could be secondary to prior neck surgery/issues. No red flags on history or exam today. Con't Toradol PO prn for pain. F/u in 1-2 weeks.

## 2012-11-04 NOTE — Progress Notes (Signed)
Patient ID: Lance Luna, male   DOB: [birth year], 42 y.o.   MRN: [id number]  [name] Bowen Kia M. Esai Stecklein, MD Phone: [phone]   Subjective: HPI: Patient is a 42 y.o. male presenting to clinic today for headaches.  Headache Patient presents for evaluation of headache. Symptoms began about several years ago. Generally, the headaches last about 1 day and occur continuously. The headaches are usually dull and are located in left posterior. Recently, the headaches have been increasing in frequency. Work attendance or other daily activities are not affected by the headaches. Precipitating factors include: none which have been determined. Associated neurologic symptoms: decreased physical activity and dizziness. The patient denies loss of balance, muscle weakness and vision problems. Home treatment has included resting with some improvement. Other history includes: tension headaches diagnosed in the past. Family history includes migraine headaches in mother.  History Reviewed:  Non smoker, passive smoker Health maintenance: Needs flu shot but declines   ROS: Please see HPI above.  Objective: Office vital signs reviewed. BP 121/81  Pulse 78  Temp(Src) 98.1 F (36.7 C) (Oral)  Wt 324 lb (146.965 kg)  BMI 46.49 kg/m2  Physical Examination:  General: Awake, alert. NAD HEENT: Atraumatic, normocephalic. MMM. Neck: No masses palpated. No LAD. Mild TTP of left posterior side with ?muscle tightness Pulm: CTAB, no wheezes Cardio: RRR, no murmurs appreciated Abdomen:+BS, soft, nontender, nondistended Extremities: No edema Neuro: Grossly intact. CN 2-12 intact  Assessment: 42 y.o. male with headache  Plan: See Problem List and After Visit Summary

## 2012-11-04 NOTE — Patient Instructions (Addendum)
It was good to see you today. I am sorry you are having headaches.  I sent your prescription to the pharmacy.  Please come back to see me to discuss everything else.  Jett Fukuda M. Katalina Magri, M.D.

## 2012-11-12 NOTE — Telephone Encounter (Signed)
11

## 2012-11-22 ENCOUNTER — Ambulatory Visit (INDEPENDENT_AMBULATORY_CARE_PROVIDER_SITE_OTHER): Payer: Medicare Other | Admitting: Family Medicine

## 2012-11-22 ENCOUNTER — Encounter: Payer: Self-pay | Admitting: Family Medicine

## 2012-11-22 VITALS — BP 130/84 | HR 62 | Temp 97.8°F | Ht 72.0 in | Wt 327.0 lb

## 2012-11-22 DIAGNOSIS — Z5289 Donor of other specified organs or tissues: Secondary | ICD-10-CM | POA: Insufficient documentation

## 2012-11-22 DIAGNOSIS — Z529 Donor of unspecified organ or tissue: Secondary | ICD-10-CM

## 2012-11-22 DIAGNOSIS — M545 Low back pain, unspecified: Secondary | ICD-10-CM

## 2012-11-22 DIAGNOSIS — I10 Essential (primary) hypertension: Secondary | ICD-10-CM

## 2012-11-22 DIAGNOSIS — Z23 Encounter for immunization: Secondary | ICD-10-CM

## 2012-11-22 MED ORDER — TRAMADOL HCL 50 MG PO TABS: 50.0000 mg | ORAL_TABLET | Freq: Three times a day (TID) | ORAL | Status: DC | PRN

## 2012-11-22 NOTE — Patient Instructions (Addendum)
You are healthy today!   Continue to work on [employer].  We will let you know about your referral to urology. Hopefully they can help you out.  I will see you back in December for your physical.  Brenlyn Beshara M. Lei Dower, M.D.

## 2012-11-22 NOTE — Assessment & Plan Note (Signed)
Patient desires work up for sperm analysis. He has mentioned other issues with his genitalia in the past that has not been addressed. Will refer to Urology for evaluation. (Of note, patient is changing insurance to [employer]. Will await new insurance information prior to referral.)

## 2012-11-22 NOTE — Progress Notes (Signed)
Patient ID: [name], male   DOB: 1970-04-26, 42 y.o.   MRN: [id number]  [name] Irven Ingalsbe M. Brendan Gadson, MD Phone: [phone]   Subjective: HPI: Patient is a 42 y.o. male presenting to clinic today for follow up appointment. Concerns today include:  1. Hypertension Blood pressure today: 130/84 Taking Meds: Lisinopril-hctz  Side effects: None, chronic headaches ROS: Denies visual changes, nausea, vomiting, chest pain, abdominal pain or shortness of breath. Does state he has some swelling in legs. *Weight stable. States he is still trying to diet and watch what he is eating  2. Desire to be sperm donor Patient has a friend that is requesting him to be her sperm donor. He states he would like sperm analysis and further work up.  3. Back pain  Followed by Neuro in [facility]. Has disc herniation. Worse if he steps too hard or sleeps in wrong position. Next appointment on 10/13. Has had epidural steroid injections but that made his legs weak.   History Reviewed: Non smoker. Health Maintenance: Had flu shot today  ROS: Please see HPI above.  Objective: Office vital signs reviewed. BP 130/84  Pulse 62  Temp(Src) 97.8 F (36.6 C) (Oral)  Ht 6' (1.829 m)  Wt 327 lb (148.326 kg)  BMI 44.34 kg/m2  Physical Examination:  General: Awake, alert. NAD HEENT: Atraumatic, normocephalic. Wearing glasses Pulm: CTAB, no wheezes Cardio: RRR, no murmurs appreciated Abdomen: Obese, +BS, soft, nontender, nondistended Extremities: Trace edema Skin: dry, excoriated patches on hands bilaterally Neuro: Grossly intact  Assessment: 42 y.o. male follow up.   Plan: See Problem List and After Visit Summary

## 2012-11-22 NOTE — Assessment & Plan Note (Signed)
Next appt with neuro on 10/13. Patient's pain somewhat controlled with Tramadol and rest. Refill given. Will send me office notes from neuro. No changes at this time.

## 2012-11-22 NOTE — Assessment & Plan Note (Signed)
BP at goal. Con't Lisinopril-hctz. Will need labs in December. F/u at that time. Con't weight loss and call Dr. Gerilyn Pilgrim for an appointment, as needed.

## 2012-11-25 ENCOUNTER — Ambulatory Visit (INDEPENDENT_AMBULATORY_CARE_PROVIDER_SITE_OTHER): Payer: No Typology Code available for payment source | Admitting: Psychiatry

## 2012-11-25 DIAGNOSIS — F209 Schizophrenia, unspecified: Secondary | ICD-10-CM

## 2012-12-02 ENCOUNTER — Ambulatory Visit (INDEPENDENT_AMBULATORY_CARE_PROVIDER_SITE_OTHER): Payer: No Typology Code available for payment source | Admitting: Psychiatry

## 2012-12-02 ENCOUNTER — Telehealth: Payer: Self-pay | Admitting: Home Health Services

## 2012-12-02 DIAGNOSIS — F209 Schizophrenia, unspecified: Secondary | ICD-10-CM

## 2012-12-02 NOTE — Telephone Encounter (Signed)
Spoke with Onalee Hua.  Pt reports taking medications.  Pt reports feeling good.  Reports walking daily for 30 minutes.  Pt has been eating a lot of [place] burgers and trys to eat only 1 plate.  Pt has fu apopintmen with Dr. Gerilyn Pilgrim on 9/27.  Pt's overall goal is weight loss, htn management.

## 2012-12-09 ENCOUNTER — Ambulatory Visit (INDEPENDENT_AMBULATORY_CARE_PROVIDER_SITE_OTHER): Payer: No Typology Code available for payment source | Admitting: Psychiatry

## 2012-12-09 DIAGNOSIS — F209 Schizophrenia, unspecified: Secondary | ICD-10-CM

## 2012-12-13 ENCOUNTER — Ambulatory Visit: Payer: Self-pay | Admitting: Family Medicine

## 2012-12-16 ENCOUNTER — Ambulatory Visit (INDEPENDENT_AMBULATORY_CARE_PROVIDER_SITE_OTHER): Payer: No Typology Code available for payment source | Admitting: Psychiatry

## 2012-12-16 DIAGNOSIS — F209 Schizophrenia, unspecified: Secondary | ICD-10-CM

## 2012-12-23 ENCOUNTER — Ambulatory Visit (INDEPENDENT_AMBULATORY_CARE_PROVIDER_SITE_OTHER): Payer: No Typology Code available for payment source | Admitting: Psychiatry

## 2012-12-23 DIAGNOSIS — F209 Schizophrenia, unspecified: Secondary | ICD-10-CM

## 2012-12-24 ENCOUNTER — Other Ambulatory Visit: Payer: Self-pay

## 2012-12-24 ENCOUNTER — Emergency Department (HOSPITAL_COMMUNITY): Payer: Medicare Other

## 2012-12-24 ENCOUNTER — Encounter (HOSPITAL_COMMUNITY): Payer: Self-pay | Admitting: Emergency Medicine

## 2012-12-24 ENCOUNTER — Emergency Department (HOSPITAL_COMMUNITY)
Admission: EM | Admit: 2012-12-24 | Discharge: 2012-12-24 | Disposition: A | Discharge status: Home / Self Care | Payer: Medicare Other | Attending: Emergency Medicine | Admitting: Emergency Medicine

## 2012-12-24 DIAGNOSIS — I10 Essential (primary) hypertension: Secondary | ICD-10-CM | POA: Insufficient documentation

## 2012-12-24 DIAGNOSIS — Z8669 Personal history of other diseases of the nervous system and sense organs: Secondary | ICD-10-CM | POA: Insufficient documentation

## 2012-12-24 DIAGNOSIS — Z8619 Personal history of other infectious and parasitic diseases: Secondary | ICD-10-CM | POA: Insufficient documentation

## 2012-12-24 DIAGNOSIS — Z79899 Other long term (current) drug therapy: Secondary | ICD-10-CM | POA: Insufficient documentation

## 2012-12-24 DIAGNOSIS — K219 Gastro-esophageal reflux disease without esophagitis: Secondary | ICD-10-CM | POA: Insufficient documentation

## 2012-12-24 DIAGNOSIS — R0789 Other chest pain: Secondary | ICD-10-CM

## 2012-12-24 DIAGNOSIS — R071 Chest pain on breathing: Secondary | ICD-10-CM | POA: Insufficient documentation

## 2012-12-24 LAB — CBC
MCH: 32.1 pg (ref 26.0–34.0)
MCHC: 35.6 g/dL (ref 30.0–36.0)
MCV: 90 fL (ref 78.0–100.0)
Platelets: 256 10*3/uL (ref 150–400)
RBC: 4.99 MIL/uL (ref 4.22–5.81)

## 2012-12-24 LAB — BASIC METABOLIC PANEL
CO2: 23 mEq/L (ref 19–32)
Calcium: 9.4 mg/dL (ref 8.4–10.5)
Creatinine, Ser: 0.85 mg/dL (ref 0.50–1.35)
GFR calc non Af Amer: >90 mL/min (ref >90)
Glucose, Bld: 90 mg/dL (ref 70–99)

## 2012-12-24 LAB — POTASSIUM: Potassium: 3.8 mEq/L (ref 3.5–5.1)

## 2012-12-24 LAB — PRO B NATRIURETIC PEPTIDE: Pro B Natriuretic peptide (BNP): 103.1 pg/mL (ref 0–125)

## 2012-12-24 LAB — POCT I-STAT TROPONIN I

## 2012-12-24 MED ORDER — KETOROLAC TROMETHAMINE 30 MG/ML IJ SOLN
30.0000 mg | Freq: Once | INTRAMUSCULAR | Status: AC
  Administered 2012-12-24: 30 mg via INTRAVENOUS
  Filled 2012-12-24: qty 1

## 2012-12-24 MED ORDER — MORPHINE SULFATE 4 MG/ML IJ SOLN
4.0000 mg | Freq: Once | INTRAMUSCULAR | Status: AC
  Administered 2012-12-24: 4 mg via INTRAVENOUS
  Filled 2012-12-24: qty 1

## 2012-12-24 NOTE — ED Provider Notes (Signed)
CSN: [id number]     Arrival date & time 12/24/12  1227 History   First MD Initiated Contact with Patient 12/24/12 1238     Chief Complaint  Patient presents with  . Chest Pain   (Consider location/radiation/quality/duration/timing/severity/associated sxs/prior Treatment) HPI Comments: Patient is a 42 year old male with a past medical history of hypertension, chronic headache, mental retardation, acid reflux and sleep apnea who presents to the emergency department complaining of chest pain x1 day. Pain began last night before he went to sleep, located on the left side of his chest described as sharp and squeezing, constant, rated 10 out of 10 radiating down his left arm. Pain worse with deep inspiration. Admits to associated nausea without vomiting. No diaphoresis or shortness of breath. Denies ever having chest pain like this in the past. He is a nonsmoker. Denies personal or family history of CAD. Denies lightheadedness, dizziness, weakness or confusion.  Patient is a 42 y.o. male presenting with chest pain. The history is provided by the patient.  Chest Pain Associated symptoms: nausea     Past Medical History  Diagnosis Date  . H. pylori infection 10/2001  . Carpal tunnel syndrome of left wrist   . Muscle strain     Multiple  . Mental retardation   . Hypertension   . Chronic headache   . Obstructive sleep apnea    Past Surgical History  Procedure Laterality Date  . Cervical disc surgery  2003  . Esophagogastroduodenoscopy  2003    No ulcers  . Appendectomy  2002  . Lumbar laminectomy  12/2002    L3-4, L4-5  . Carpal tunnel release     Family History  Problem Relation Age of Onset  . Migraines Mother   . Heart disease Paternal Grandfather   . Hypertension Paternal Grandmother   . Stroke Paternal Grandfather   . Lung cancer Paternal Grandmother    History  Substance Use Topics  . Smoking status: Never Smoker   . Smokeless tobacco: Never Used  . Alcohol Use: No     Review of Systems  Cardiovascular: Positive for chest pain.  Gastrointestinal: Positive for nausea.  All other systems reviewed and are negative.    Allergies  Review of patient's allergies indicates no known allergies.  Home Medications   Current Outpatient Rx  Name  Route  Sig  Dispense  Refill  . ARIPiprazole (ABILIFY) 10 MG tablet   Oral   Take 10 mg by mouth daily.         . baclofen (LIORESAL) 10 MG tablet   Oral   Take 1 tablet (10 mg total) by mouth 2 (two) times daily.   60 each   0   . FLUoxetine (PROZAC) 40 MG capsule   Oral   Take 40 mg by mouth daily.          Marland Kitchen gabapentin (NEURONTIN) 600 MG tablet   Oral   Take 2 tablets (1,200 mg total) by mouth 3 (three) times daily.   90 tablet   11   . ketorolac (TORADOL) 10 MG tablet   Oral   Take 1 tablet (10 mg total) by mouth every 6 (six) hours as needed. For bad headaches   45 tablet   2   . lisinopril-hydrochlorothiazide (PRINZIDE,ZESTORETIC) 20-25 MG per tablet   Oral   Take 1 tablet by mouth daily.   90 tablet   3   . loratadine (CLARITIN) 10 MG tablet   Oral   Take 1 tablet (  10 mg total) by mouth daily.   90 tablet   3   . meclizine (ANTIVERT) 25 MG tablet   Oral   Take 1 tablet (25 mg total) by mouth 3 (three) times daily as needed.   30 tablet   0   . nystatin (MYCOSTATIN) powder   Topical   Apply topically 4 (four) times daily.   15 g   0   . omeprazole (PRILOSEC) 40 MG capsule   Oral   Take 1 capsule (40 mg total) by mouth daily.   90 capsule   3   . traMADol (ULTRAM) 50 MG tablet   Oral   Take 1 tablet (50 mg total) by mouth every 8 (eight) hours as needed. For pain   30 tablet   2   . traZODone (DESYREL) 100 MG tablet   Oral   Take 200 mg by mouth at bedtime.           BP 138/77  Pulse 62  Temp(Src) 97.9 F (36.6 C) (Oral)  Resp 26  Ht 6' (1.829 m)  Wt 333 lb 12.8 oz (151.411 kg)  BMI 45.26 kg/m2  SpO2 95% Physical Exam  Nursing note and vitals  reviewed. Constitutional: He is oriented to person, place, and time. He appears well-developed and well-nourished. No distress.  Morbidly obese.  HENT:  Head: Normocephalic and atraumatic.  Mouth/Throat: Oropharynx is clear and moist.  Eyes: Conjunctivae and EOM are normal. Pupils are equal, round, and reactive to light.  Neck: Normal range of motion. Neck supple.  Cardiovascular: Normal rate, regular rhythm, normal heart sounds and intact distal pulses.   Pulmonary/Chest: Breath sounds normal. Tachypnea noted. He has no decreased breath sounds. He has no wheezes. He has no rhonchi. He has no rales. He exhibits tenderness (left).  Abdominal: Soft. Bowel sounds are normal. There is no tenderness.  Musculoskeletal: Normal range of motion. He exhibits no edema.  Neurological: He is alert and oriented to person, place, and time. He has normal strength. No sensory deficit.  Skin: Skin is warm and dry. No rash noted. He is not diaphoretic.  Psychiatric: He has a normal mood and affect. His behavior is normal.    ED Course  Procedures (including critical care time) Labs Review Labs Reviewed  BASIC METABOLIC PANEL - Abnormal; Notable for the following:    Sodium 133 (*)    Potassium 5.4 (*)    All other components within normal limits  CBC  PRO B NATRIURETIC PEPTIDE  POTASSIUM  POCT I-STAT TROPONIN I   Imaging Review Dg Chest 2 View  12/24/2012   CLINICAL DATA:  Left-sided chest pain with shortness of breath.  EXAM: CHEST  2 VIEW  COMPARISON:  Multiple priors  FINDINGS: Cardiac silhouette is mildly enlarged. There is pulmonary vascular congestion. Mild left basilar opacity, likely atelectasis. No pleural effusion. No pneumothorax. No frank edema. No pleural effusion or pneumothorax. No acute osseous abnormality.  IMPRESSION: 1. Pulmonary vascular congestion.  2. Mild enlargement of the cardiac silhouette.  3.  Left basilar opacity, likely atelectasis.   Electronically Signed   By: Jerene Dilling M.D.   On: 12/24/2012 13:18    EKG Interpretation   None      Date: 12/24/2012  Rate: 59  Rhythm: sinus bradycardia  QRS Axis: left  Intervals: normal  ST/T Wave abnormalities: normal  Conduction Disutrbances:none  Narrative Interpretation: Sinus bradycardia, left axis deviation, possible anterior infarct, age undetermined, abnormal EKG  Old EKG Reviewed: unchanged  MDM   1. Chest wall pain     Morbidly obese patient with chest pain radiating down left arm. Chest pain reproducible. RR 26, vitals otherwise unremarkable. He appears in NAD. Labs pending- cbc, bmp, troponin. EKG without sig changes. CXR pending. Toradol for pain. Nausea not present in ED. Doubt CAD. 3:23 PM Troponin negative. BNP within normal limits. Chest x-ray with results shown above. Patient reports mild improvement of pain with Toradol and morphine, pain still reproducible on left, worse with arm movement. I feel patient's symptoms are musculoskeletal in nature. Low risk for CAD. Vitals remained stable. He is stable for discharge. Followup with PCP. Return precautions given. Patient states understanding of treatment care plan and is agreeable.  Trevor Mace, PA-C 12/24/12 1525

## 2012-12-24 NOTE — ED Notes (Signed)
Removed pt's IV before pt left for discharge 

## 2012-12-24 NOTE — ED Notes (Signed)
Robyn, PA back in to speak with patient.

## 2012-12-24 NOTE — ED Notes (Signed)
Iv attempt x 2 unsuccessful but able to draw blood.

## 2012-12-24 NOTE — ED Notes (Addendum)
Pt stating his pain remains an 8/10 and pain down his left arm. PA made aware pt would like to speak to her before leaving. No further orders received for pain medication at this time. IV has been removed by tech. Pt states he has a pre-existing appt with his cardiologist scheduled for Monday.

## 2012-12-24 NOTE — ED Notes (Signed)
Last night before bed he had pain in chest radiating down to arm with nausea. Denies diaphoresis. Pain has been worsening. Feels like squeezing in arm and chest. Nothing makes it worse or better.

## 2012-12-24 NOTE — ED Notes (Signed)
Chest pain started last night, constant, central chest radiating down left arm. States pain when breathing. No nausea/vomiting.

## 2012-12-24 NOTE — ED Notes (Signed)
Robyn, PA back at the bedside.

## 2012-12-24 NOTE — ED Notes (Signed)
Pt advised not to drive home after receiving medications in the ER.  Pt understood the consequences if he would be pulled over if he drove.

## 2012-12-25 ENCOUNTER — Telehealth: Payer: Self-pay | Admitting: Family Medicine

## 2012-12-25 NOTE — Telephone Encounter (Signed)
Went to hospital for CP yesterday afternoon and was sent home after r/o ACS. He states he still has the same pain, unchanged from discharge, no better or worse. They did not give him anything for pain (to go home on) and that is his main complaint. He states the Toradol and morphine he was given in the hospital did not help much. He has a follow up at Cardiologist on Monday, with Dr. Rance Muir. He states he could not get into see his PCP until 11/7. In chart review he has been diagnosed with chronic MSK chest pain that is not cardiac in origin. However, he states this time he is having squeezing feeling radiating down his left arm and chest. He also endorses leg pain and tightness. Chest pain is worse with deep breaths and occurs at rest. Patient states none of medications to help with pain are working (toradol/tramadol). Pain is not associated with nausea, vomit, dizziness or diaphoresis. Advised patient to come back to ED if pain is unbearable. Advised him it is unlikely he is having a heart attack given the history, but considering the change in symptoms with radiation to the arm and uncontrolled pain, he should be evaluated. Patient understood plan, and stated he would probably come in to ED if it did not get better then. I also advised him if he decided not to come in to ED, to make an appointment for Monday at [facility] clinic to be seen for same day and keep his appointment with cardiologist.   Felix Pacini DO

## 2012-12-25 NOTE — ED Provider Notes (Signed)
Medical screening examination/treatment/procedure(s) were performed by non-physician practitioner and as supervising physician I was immediately available for consultation/collaboration.  EKG Interpretation     Ventricular Rate:  59 PR Interval:  160 QRS Duration: 94 QT Interval:  420 QTC Calculation: 415 R Axis:   -63 Text Interpretation:  Sinus bradycardia Left axis deviation No significant change since last tracing            Discussed case w/ PA. Pt is low risk for MACE per HEART score w/ reproducible chest wall pain. Will rec close f/u w/ pcp.   Junius Argyle, MD 12/25/12 1209

## 2012-12-27 ENCOUNTER — Encounter: Payer: Self-pay | Admitting: Pharmacist

## 2012-12-27 ENCOUNTER — Telehealth: Payer: Self-pay | Admitting: *Deleted

## 2012-12-27 ENCOUNTER — Ambulatory Visit (INDEPENDENT_AMBULATORY_CARE_PROVIDER_SITE_OTHER): Payer: Medicare Other | Admitting: Pharmacist

## 2012-12-27 VITALS — BP 121/83 | HR 56 | Temp 97.6°F | Ht 73.0 in | Wt 336.0 lb

## 2012-12-27 DIAGNOSIS — R0789 Other chest pain: Secondary | ICD-10-CM

## 2012-12-27 MED ORDER — HYDROCODONE-ACETAMINOPHEN 5-325 MG PO TABS: 1.0000 | ORAL_TABLET | Freq: Four times a day (QID) | ORAL | Status: DC | PRN

## 2012-12-27 NOTE — Progress Notes (Signed)
Patient ID: Lance Luna, male   DOB: Oct 13, 1970, 42 y.o.   MRN: [id number]  [facility] [name] M. Ronelle [name], MD Phone: [phone]   Subjective: HPI: Patient is a 42 y.o. male presenting to clinic today for PFTs with Dr. Raymondo Band.  He was seen in the ED on 11/7 for chest pain. Was ruled to be MSK pain. Followed up with Dr. Sharyn Lull this morning, who wanted patient to have "his lungs checked out."   Patient states his pain started acutely early morning on 11/7, and has been getting progressively worse. He states his pain is left sided, radiating to left arm and left neck. His pain is worse with breathing (both inhale and exhale, but exhaling is slightly worse.) He does have a history of costochondritis treated with Tramadol, but states this pain is different location and quality.  Denies cough, fevers. No leg pain. No recent travel. No recent stressors. No heavy lifting or twisting.   On his PFT this morning, he was suboptimal both pre and post brochodilator. He states he gave his best effort, but it was painful for him. Asked by Dr. Raymondo Band to evaluate patient.   History Reviewed: Non-smoker. Health Maintenance: UTD, had flu shot  ROS: Please see HPI above. Feels shortness of breath.  Objective: Office vital signs reviewed. BP 121/83  Pulse 56  Temp(Src) 97.6 F (36.4 C)  Ht 6\' 1"  (1.854 m)  Wt 336 lb (152.409 kg)  BMI 44.34 kg/m2  SpO2 97%  Physical Examination:  General: Awake, alert. NAD. Sitting up in chair. Talking in full sentences. HEENT: Atraumatic, normocephalic. MMM. Neck: No masses palpated. No LAD. FROM of neck Pulm: Very shallow inspiration. CTAB, no wheezes Cardio: RRR, no murmurs appreciated. No chest tenderness on palpation Abdomen: Obese, +BS, soft, nontender, nondistended Extremities: No edema. No calf tenderness Neuro: Grossly intact  Assessment: 42 y.o. male with chest pain  Plan: -Unsure of etiology. STAT d.dimer pending to rule out  PE. Will call patient with result. No obvious risk factors for blood clot. We did briefly discuss blood thinners in case it is positive. -Will send home with Vicodin to take for pain, since this is likely pleurisy. If pain resolves, will recheck PFT in the next few weeks. (Advised to not take Tramadol at same time.) -Follow up with me next week, or sooner if needed.

## 2012-12-27 NOTE — Patient Instructions (Signed)
It was good to see you, I am sorry you are still in pain.  I will call you with the lab result this afternoon and we will go from there.  Take the Vicodin for pain, you can still take Toradol (not tramadol) as well.  I will see you next Monday, and we will talk about repeating lung test then if we need to.  Lorisa Scheid M. Torell Minder, M.D.

## 2012-12-27 NOTE — Assessment & Plan Note (Signed)
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 Post nebulized albuterol tx revealed NO significant improvement in FVC or FEV1. Patient stated he was giving his best effort but that it hurts when he breathes.  Patient has been experiencing chest pain for 3 days and taking no medications. Deferred patient care to Dr. Deirdre Priest and [facility] for additional evaluation. Reviewed results of pulmonary function tests with patient.  Pt verbalized understanding of results and education.  Written pt instructions provided.  F/U Clinic visit with Dr. Mikel Cella.   Total time in face to face counseling 55 minutes.  Patient seen with Leafy Kindle, PharmD Candidate and Anthony Sar, PharmD Resident.

## 2012-12-27 NOTE — Progress Notes (Signed)
S:    Patient arrives alone and stated he is "not doing too good". Presents for lung function evaluation. He started experiencing left-sided chest pain with associated left arm pain Friday 11/7 and went to the ED where CV processed were ruled out and it was thought to be MSK pain. He was seen by Dr. Sharyn Lull this morning and was sent to [facility] to have "lung tests" done.  Patient reports breathing has been painful since Friday with the onset of chest pain.    O: See "scanned report" or Documentation Flowsheet (discrete results - PFTs) for  Spirometry results. Patient provided good effort while attempting spirometry.   Albuterol Neb  Lot# [id number]     Exp. 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 Post nebulized albuterol tx revealed NO significant improvement in FVC or FEV1. Patient stated he was giving his best effort but that it hurts when he breathes.  Patient has been experiencing chest pain for 3 days and taking no medications. Deferred patient care to Dr. Deirdre Priest and [facility] for additional evaluation. Reviewed results of pulmonary function tests with patient.  Pt verbalized understanding of results and education.  Written pt instructions provided.  F/U Clinic visit with Dr. Mikel Cella.   Total time in face to face counseling 55 minutes.  Patient seen with Leafy Kindle, PharmD Candidate and Anthony Sar, PharmD Resident.

## 2012-12-27 NOTE — Telephone Encounter (Signed)
Called pt (per Dr. Alfonzo Beers request) and informed that there is no blood clot.  Advised to continue pain meds and heating pad for pain.  Reminded of appt on Monday (01/03/2013). Lance Luna, Maryjo Rochester

## 2012-12-28 ENCOUNTER — Telehealth: Payer: Self-pay | Admitting: Family Medicine

## 2012-12-28 NOTE — Telephone Encounter (Signed)
Emergency Line / After Hours Call  Patient called the after-hours line because he has been having chest discomfort, difficulty breathing, and has felt febrile. He was seen in clinic yesterday and had a d-dimer checked to rule out PE, which was negative. He reports worsening chest discomfort, difficulty breathing, nausea, and alternating between feeling hot and having chills. I recommended that he come in to the ER right away to be evaluated. As he does not have anyone who is able to bring him to the ER, I advised that he call 911 to have an ambulance bring him to the ER. Pt agreed and will call 911.  Levert Feinstein, MD Family Medicine PGY-2

## 2012-12-28 NOTE — Telephone Encounter (Signed)
Pt called because he is taking tramadol and Vicodin and now he is freezing. He was not freezing until he combined the medications. jw

## 2012-12-28 NOTE — Progress Notes (Signed)
Patient ID: Lance Luna, male   DOB: Oct 04, 1970, 42 y.o.   MRN: [id number] Reviewed: Agree with Dr. Macky Lower documentation and management.

## 2012-12-29 ENCOUNTER — Emergency Department (HOSPITAL_COMMUNITY)
Admission: EM | Admit: 2012-12-29 | Discharge: 2012-12-29 | Disposition: A | Discharge status: Home / Self Care | Payer: Medicare Other | Attending: Emergency Medicine | Admitting: Emergency Medicine

## 2012-12-29 ENCOUNTER — Encounter (HOSPITAL_COMMUNITY): Payer: Self-pay | Admitting: Emergency Medicine

## 2012-12-29 ENCOUNTER — Emergency Department (HOSPITAL_COMMUNITY): Payer: Medicare Other

## 2012-12-29 ENCOUNTER — Other Ambulatory Visit: Payer: Self-pay

## 2012-12-29 DIAGNOSIS — R071 Chest pain on breathing: Secondary | ICD-10-CM | POA: Insufficient documentation

## 2012-12-29 DIAGNOSIS — G4733 Obstructive sleep apnea (adult) (pediatric): Secondary | ICD-10-CM | POA: Insufficient documentation

## 2012-12-29 DIAGNOSIS — R000 Tachycardia, unspecified: Secondary | ICD-10-CM | POA: Insufficient documentation

## 2012-12-29 DIAGNOSIS — R0781 Pleurodynia: Secondary | ICD-10-CM

## 2012-12-29 DIAGNOSIS — R112 Nausea with vomiting, unspecified: Secondary | ICD-10-CM | POA: Insufficient documentation

## 2012-12-29 DIAGNOSIS — R9431 Abnormal electrocardiogram [ECG] [EKG]: Secondary | ICD-10-CM | POA: Insufficient documentation

## 2012-12-29 DIAGNOSIS — Z79899 Other long term (current) drug therapy: Secondary | ICD-10-CM | POA: Insufficient documentation

## 2012-12-29 DIAGNOSIS — R0602 Shortness of breath: Secondary | ICD-10-CM | POA: Insufficient documentation

## 2012-12-29 DIAGNOSIS — Z8619 Personal history of other infectious and parasitic diseases: Secondary | ICD-10-CM | POA: Insufficient documentation

## 2012-12-29 DIAGNOSIS — I428 Other cardiomyopathies: Secondary | ICD-10-CM | POA: Insufficient documentation

## 2012-12-29 DIAGNOSIS — I10 Essential (primary) hypertension: Secondary | ICD-10-CM | POA: Insufficient documentation

## 2012-12-29 DIAGNOSIS — J449 Chronic obstructive pulmonary disease, unspecified: Secondary | ICD-10-CM | POA: Insufficient documentation

## 2012-12-29 DIAGNOSIS — Z8669 Personal history of other diseases of the nervous system and sense organs: Secondary | ICD-10-CM | POA: Insufficient documentation

## 2012-12-29 DIAGNOSIS — G5600 Carpal tunnel syndrome, unspecified upper limb: Secondary | ICD-10-CM | POA: Insufficient documentation

## 2012-12-29 DIAGNOSIS — F79 Unspecified intellectual disabilities: Secondary | ICD-10-CM | POA: Insufficient documentation

## 2012-12-29 DIAGNOSIS — J4489 Other specified chronic obstructive pulmonary disease: Secondary | ICD-10-CM | POA: Insufficient documentation

## 2012-12-29 DIAGNOSIS — R791 Abnormal coagulation profile: Secondary | ICD-10-CM | POA: Insufficient documentation

## 2012-12-29 LAB — BASIC METABOLIC PANEL
BUN: 11 mg/dL (ref 6–23)
CO2: 24 mEq/L (ref 19–32)
Calcium: 9.3 mg/dL (ref 8.4–10.5)
Creatinine, Ser: 1.12 mg/dL (ref 0.50–1.35)
GFR calc non Af Amer: 79 mL/min — ABNORMAL LOW (ref >90)
Glucose, Bld: 109 mg/dL — ABNORMAL HIGH (ref 70–99)
Potassium: 3.7 mEq/L (ref 3.5–5.1)
Sodium: 137 mEq/L (ref 135–145)

## 2012-12-29 LAB — CBC
HCT: 47.9 % (ref 39.0–52.0)
Hemoglobin: 16.8 g/dL (ref 13.0–17.0)
MCH: 31.8 pg (ref 26.0–34.0)
MCHC: 35.1 g/dL (ref 30.0–36.0)
RBC: 5.28 MIL/uL (ref 4.22–5.81)
RDW: 13.9 % (ref 11.5–15.5)

## 2012-12-29 LAB — D-DIMER, QUANTITATIVE: D-Dimer, Quant: 1.12 ug/mL-FEU — ABNORMAL HIGH (ref 0.00–0.48)

## 2012-12-29 LAB — POCT I-STAT TROPONIN I

## 2012-12-29 MED ORDER — PREDNISONE 20 MG PO TABS
40.0000 mg | ORAL_TABLET | Freq: Once | ORAL | Status: AC
  Administered 2012-12-29: 40 mg via ORAL
  Filled 2012-12-29: qty 2

## 2012-12-29 MED ORDER — PREDNISONE 20 MG PO TABS: ORAL_TABLET | ORAL | Status: DC

## 2012-12-29 MED ORDER — CYCLOBENZAPRINE HCL 10 MG PO TABS: 10.0000 mg | ORAL_TABLET | Freq: Two times a day (BID) | ORAL | Status: DC | PRN

## 2012-12-29 MED ORDER — RANITIDINE HCL 150 MG PO CAPS: 150.0000 mg | ORAL_CAPSULE | Freq: Every day | ORAL | Status: DC

## 2012-12-29 MED ORDER — GI COCKTAIL ~~LOC~~
30.0000 mL | Freq: Once | ORAL | Status: AC
  Administered 2012-12-29: 30 mL via ORAL
  Filled 2012-12-29: qty 30

## 2012-12-29 MED ORDER — PANTOPRAZOLE SODIUM 20 MG PO TBEC: 20.0000 mg | DELAYED_RELEASE_TABLET | Freq: Every day | ORAL | Status: DC

## 2012-12-29 MED ORDER — SODIUM CHLORIDE 0.9 % IV BOLUS (SEPSIS)
1000.0000 mL | Freq: Once | INTRAVENOUS | Status: AC
  Administered 2012-12-29: 1000 mL via INTRAVENOUS

## 2012-12-29 MED ORDER — ONDANSETRON HCL 4 MG/2ML IJ SOLN
4.0000 mg | Freq: Once | INTRAMUSCULAR | Status: AC
  Administered 2012-12-29: 4 mg via INTRAVENOUS
  Filled 2012-12-29: qty 2

## 2012-12-29 MED ORDER — IOHEXOL 350 MG/ML SOLN
100.0000 mL | Freq: Once | INTRAVENOUS | Status: AC | PRN
  Administered 2012-12-29: 200 mL via INTRAVENOUS

## 2012-12-29 MED ORDER — ALBUTEROL SULFATE HFA 108 (90 BASE) MCG/ACT IN AERS: 2.0000 | INHALATION_SPRAY | RESPIRATORY_TRACT | Status: AC | PRN

## 2012-12-29 MED ORDER — FAMOTIDINE IN NACL 20-0.9 MG/50ML-% IV SOLN
20.0000 mg | Freq: Once | INTRAVENOUS | Status: AC
  Administered 2012-12-29: 20 mg via INTRAVENOUS
  Filled 2012-12-29: qty 50

## 2012-12-29 MED ORDER — IOHEXOL 350 MG/ML SOLN
100.0000 mL | Freq: Once | INTRAVENOUS | Status: AC | PRN
  Administered 2012-12-29: 100 mL via INTRAVENOUS

## 2012-12-29 NOTE — ED Provider Notes (Signed)
Physical Exam  BP 129/62  Pulse 80  Temp(Src) 98.5 F (36.9 C) (Oral)  Resp 17  Ht 6' (1.829 m)  Wt 331 lb 4 oz (150.254 kg)  BMI 44.92 kg/m2  SpO2 96%  Physical Exam  Nursing note and vitals reviewed. Constitutional: He is oriented to person, place, and time. He appears well-developed and well-nourished. No distress.  obese  HENT:  Head: Normocephalic and atraumatic.  Right Ear: External ear normal.  Left Ear: External ear normal.  Nose: Nose normal.  Eyes: Conjunctivae are normal.  Neck: Normal range of motion. No tracheal deviation present.  Cardiovascular: Normal rate, regular rhythm, normal heart sounds, intact distal pulses and normal pulses.   Pulmonary/Chest: Effort normal and breath sounds normal. No stridor.    Abdominal: Soft. He exhibits no distension. There is no tenderness.  Musculoskeletal: Normal range of motion.  Neurological: He is alert and oriented to person, place, and time.  Skin: Skin is warm and dry. He is not diaphoretic.  Psychiatric: He has a normal mood and affect. His behavior is normal.   Results for orders placed during the hospital encounter of 12/29/12  CBC      Result Value Range   WBC 11.0 (*) 4.0 - 10.5 K/uL   RBC 5.28  4.22 - 5.81 MIL/uL   Hemoglobin 16.8  13.0 - 17.0 g/dL   HCT 16.1  09.6 - 04.5 %   MCV 90.7  78.0 - 100.0 fL   MCH 31.8  26.0 - 34.0 pg   MCHC 35.1  30.0 - 36.0 g/dL   RDW 40.9  81.1 - 91.4 %   Platelets 231  150 - 400 K/uL  BASIC METABOLIC PANEL      Result Value Range   Sodium 137  135 - 145 mEq/L   Potassium 3.7  3.5 - 5.1 mEq/L   Chloride 101  96 - 112 mEq/L   CO2 24  19 - 32 mEq/L   Glucose, Bld 109 (*) 70 - 99 mg/dL   BUN 11  6 - 23 mg/dL   Creatinine, Ser 7.82  0.50 - 1.35 mg/dL   Calcium 9.3  8.4 - 95.6 mg/dL   GFR calc non Af Amer 79 (*) >90 mL/min   GFR calc Af Amer >90  >90 mL/min  D-DIMER, QUANTITATIVE      Result Value Range   D-Dimer, Quant 1.12 (*) 0.00 - 0.48 ug/mL-FEU  POCT I-STAT  TROPONIN I      Result Value Range   Troponin i, poc 0.01  0.00 - 0.08 ng/mL   Comment 3             Dg Chest 2 View  12/29/2012   CLINICAL DATA:  Chest pain for 5 days with emesis and shortness of breath.  EXAM: CHEST  2 VIEW  COMPARISON:  Chest radiograph December 24, 2012  FINDINGS: Cardiac silhouette is mildly enlarged, mediastinal silhouette is unremarkable. Lungs are clear without pleural effusions or focal consolidations. Pulmonary vasculature is unremarkable. Trachea projects midline and there is no pneumothorax.  Lower cervical ACDF. Soft tissue planes and included osseous structures are nonsuspicious.  IMPRESSION: Mild cardiomegaly, no acute pulmonary process.   Electronically Signed   By: Awilda Metro   On: 12/29/2012 05:01   Ct Angio Chest Pe W/cm &/or Wo Cm  12/29/2012   CLINICAL DATA:  Left upper chest pain and shortness of breath. Elevated D-dimer.  EXAM: CT ANGIOGRAPHY CHEST WITH CONTRAST  TECHNIQUE: Multidetector CT imaging of  the chest was performed using the standard protocol during bolus administration of intravenous contrast. Multiplanar CT image reconstructions including MIPs were obtained to evaluate the vascular anatomy.  CONTRAST:  OMNIPAQUE IOHEXOL 350 MG/ML SOLN  COMPARISON:  Chest x-ray dated 12/29/2012  FINDINGS: There are no pulmonary emboli, infiltrates, effusions, or mass lesions. No mediastinal or hilar adenopathy. Heart size is within normal limits. Visualized portion of the upper abdomen is normal. No significant osseous abnormality.  Review of the MIP images confirms the above findings.  IMPRESSION: Normal exam.  Specifically, no pulmonary emboli.   Electronically Signed   By: Geanie Cooley M.D.   On: 12/29/2012 08:54     ED Course  Procedures  MDM Patient signed out to me by Jobe Gibbon, PA-C at shift change. CTA chest pending. Will d/c with albuterol and prednisone if negative due to recent dx of pulmonary disease.   Patient presents with recurrent chest  pain. He has been evaluated by Dr. Sharyn Lull on Monday. Dr. Sharyn Lull told the patient that his pain was likely musculoskeletal and not cardiac related. Today the patient was tachycardic in triage and noted to have pain worse with inspiration. A CTA of the chest was done which shows no pulmonary emboli. He CTA chest was a normal exam. The patient remains unhappy with his care as we did not tell him what the problem was. He is unhappy hearing that it is possibly musculoskeletal related. His pain did improve with the GI cocktail. He was prescribed prednisone, albuterol, Zantac, Prilosec for home. He was given a short course of Flexeril for his pain. It appears that he has chronic pain and will continue to be unhappy with his care. Return instructions given. Vital signs stable for discharge. He will followup with his primary care physician. Dr. Judd Lien evaluated patient as well as agrees with plan.       Mora Bellman, PA-C 12/29/12 1323

## 2012-12-29 NOTE — Telephone Encounter (Signed)
As described to him in clinic and on his AVS, he should only take one or the other of these.  It is unlikely this is causing him to have chills, but he should NOT take them together.  Please remind him of this.  Thanks, [employer]. Loisann Roach, M.D.

## 2012-12-29 NOTE — Telephone Encounter (Signed)
Left message for pt to call back.  Please give message from Dr. Mikel Cella when he does.  Thanks [employer]

## 2012-12-29 NOTE — ED Notes (Signed)
When asked to stand for weight pt complained of dizziness.

## 2012-12-29 NOTE — ED Notes (Signed)
Upon d/c patient, pt stated he was not happy with care and is refusing to leave. MD and PA notified. PA and MD at bedside.

## 2012-12-29 NOTE — ED Notes (Signed)
Pt states that he will have period of palpitations followed by diahparesis, SOB and will vomit when the episode starts to subside.  Pt states he has had two episodes tonight, the first around 2100 and the second around 0300.

## 2012-12-29 NOTE — ED Provider Notes (Signed)
Medical screening examination/treatment/procedure(s) were conducted as a shared visit with non-physician practitioner(s) and myself.  I personally evaluated the patient during the encounter. Patient is a 42 year old male presents to the ER with complaints of pain in the left side of his chest that has been going on for the past week. He was seen by Dr. Sharyn Lull on Monday who felt that this was noncardiac. He presents here today for evaluation of the same complaint.  On exam the patient is afebrile vitals are stable. Heart is regular rate and rhythm and lungs are clear abdomen is benign. There is tenderness to palpation over the left chest. This reproduces his pain. Extremities are without edema.  EKG is unremarkable and cardiac enzymes are unchanged. This does not seem cardiac in nature. He had a normal heart cath 2 years ago which is reassuring. At this point I feel as though discharged to home as appropriate. He will be given a muscle relaxer and prednisone to be used as an anti-inflammatory. Followup with his cardiologist if he does not improve and return to the ER for symptoms worsen.     Geoffery Lyons, MD 12/29/12 [phone]

## 2012-12-29 NOTE — ED Provider Notes (Signed)
CSN: [id number]     Arrival date & time 12/29/12  0351 History   First MD Initiated Contact with Patient 12/29/12 0423     Chief Complaint  Patient presents with  . Chest Pain   (Consider location/radiation/quality/duration/timing/severity/associated sxs/prior Treatment) HPI Comments: Patient is a 42 year old male with a history of hypertension and sleep apnea, currently on CPAP nightly, who presents for chest pain with onset earlier yesterday afternoon. Patient states that chest pain has been persistent over the last 5 days and intermittent. Chest pain is sharp in nature and pleuritic, worse with inspiration. Pain is associated with nausea and nonbloody, nonbilious emesis x2 today. Patient states that chest pain is similar to the pain he experienced when evaluated on 12/24/2012. Workup at this time was negative and patient was given followup with cardiology. Patient followed up with Dr. Sharyn Lull in office who determined CP to be MSK in origin. 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 Patient denies associated fever, cough, hemoptysis, syncope, and numbness/tingling.  The history is provided by the patient. No language interpreter was used.    Past Medical History  Diagnosis Date  . H. pylori infection 10/2001  . Carpal tunnel syndrome of left wrist   . Muscle strain     Multiple  . Mental retardation   . Hypertension   . Chronic headache   . Obstructive sleep apnea    Past Surgical History  Procedure Laterality Date  . Cervical disc surgery  2003  . Esophagogastroduodenoscopy  2003    No ulcers  . Appendectomy  2002  . Lumbar laminectomy  12/2002    L3-4, L4-5  . Carpal tunnel release     Family History  Problem Relation Age of Onset  . Migraines Mother   . Heart disease Paternal Grandfather   . Hypertension Paternal Grandmother   . Stroke Paternal Grandfather   . Lung cancer Paternal Grandmother    History  Substance  Use Topics  . Smoking status: Never Smoker   . Smokeless tobacco: Never Used  . Alcohol Use: No    Review of Systems  Constitutional: Negative for fever.  Respiratory: Positive for shortness of breath.   Cardiovascular: Positive for chest pain.  Gastrointestinal: Positive for nausea and vomiting (NB/NB x 2). Negative for abdominal pain.  Neurological: Negative for syncope.  All other systems reviewed and are negative.    Allergies  Review of patient's allergies indicates no known allergies.  Home Medications   Current Outpatient Rx  Name  Route  Sig  Dispense  Refill  . ARIPiprazole (ABILIFY) 10 MG tablet   Oral   Take 10 mg by mouth daily.         . baclofen (LIORESAL) 10 MG tablet   Oral   Take 10 mg by mouth 2 (two) times daily.         Marland Kitchen FLUoxetine (PROZAC) 40 MG capsule   Oral   Take 40 mg by mouth daily.          Marland Kitchen gabapentin (NEURONTIN) 600 MG tablet   Oral   Take 2 tablets (1,200 mg total) by mouth 3 (three) times daily.   90 tablet   11   . HYDROcodone-acetaminophen (NORCO/VICODIN) 5-325 MG per tablet   Oral   Take 1 tablet by mouth every 6 (six) hours as needed for moderate pain.   30 tablet   0   . ketorolac (TORADOL) 10 MG tablet  Oral   Take 1 tablet (10 mg total) by mouth every 6 (six) hours as needed. For bad headaches   45 tablet   2   . lisinopril-hydrochlorothiazide (PRINZIDE,ZESTORETIC) 20-25 MG per tablet   Oral   Take 1 tablet by mouth daily.   90 tablet   3   . loratadine (CLARITIN) 10 MG tablet   Oral   Take 1 tablet (10 mg total) by mouth daily.   90 tablet   3   . omeprazole (PRILOSEC) 40 MG capsule   Oral   Take 1 capsule (40 mg total) by mouth daily.   90 capsule   3   . traMADol (ULTRAM) 50 MG tablet   Oral   Take 1 tablet (50 mg total) by mouth every 8 (eight) hours as needed. For pain   30 tablet   2   . traZODone (DESYREL) 100 MG tablet   Oral   Take 200 mg by mouth at bedtime.           BP  105/55  Pulse 80  Temp(Src) 98 F (36.7 C) (Oral)  Resp 17  Ht 6' (1.829 m)  Wt 331 lb 4 oz (150.254 kg)  BMI 44.92 kg/m2  SpO2 90%  Physical Exam  Nursing note and vitals reviewed. Constitutional: He is oriented to person, place, and time. He appears well-developed and well-nourished. No distress.  HENT:  Head: Normocephalic and atraumatic.  Mouth/Throat: Oropharynx is clear and moist. No oropharyngeal exudate.  Eyes: Conjunctivae and EOM are normal. Pupils are equal, round, and reactive to light. No scleral icterus.  Neck: Normal range of motion.  Cardiovascular: Normal rate, regular rhythm and normal heart sounds.   Patient not tachycardic as noted in triage.  Pulmonary/Chest: Effort normal. No respiratory distress. He has no wheezes. He has no rales.  No retractions or accessory muscle use. Symmetric chest expansion.  Abdominal: Soft. There is no rebound and no guarding.  Morbidly obese abdomen  Musculoskeletal: Normal range of motion.  Neurological: He is alert and oriented to person, place, and time.  Skin: Skin is warm and dry. No rash noted. He is not diaphoretic. No erythema. No pallor.  Psychiatric: He has a normal mood and affect. His behavior is normal.    ED Course  Procedures (including critical care time) Labs Review Labs Reviewed  CBC - Abnormal; Notable for the following:    WBC 11.0 (*)    All other components within normal limits  BASIC METABOLIC PANEL - Abnormal; Notable for the following:    Glucose, Bld 109 (*)    GFR calc non Af Amer 79 (*)    All other components within normal limits  D-DIMER, QUANTITATIVE - Abnormal; Notable for the following:    D-Dimer, Quant 1.12 (*)    All other components within normal limits  POCT I-STAT TROPONIN I   Imaging Review Dg Chest 2 View  12/29/2012   CLINICAL DATA:  Chest pain for 5 days with emesis and shortness of breath.  EXAM: CHEST  2 VIEW  COMPARISON:  Chest radiograph December 24, 2012  FINDINGS: Cardiac  silhouette is mildly enlarged, mediastinal silhouette is unremarkable. Lungs are clear without pleural effusions or focal consolidations. Pulmonary vasculature is unremarkable. Trachea projects midline and there is no pneumothorax.  Lower cervical ACDF. Soft tissue planes and included osseous structures are nonsuspicious.  IMPRESSION: Mild cardiomegaly, no acute pulmonary process.   Electronically Signed   By: Awilda Metro   On: 12/29/2012 05:01  EKG Interpretation   None       MDM   1. Pleuritic chest pain    Patient presents for worsening pleuritic chest pain with onset 5 days ago. Patient states pain is worse with deep breathing and associated with nausea and emesis. Patient tachycardic on arrival. D dimer ordered for this reason, especially in light of pleuritic chest pain which is elevated today. Patient did have a normal D dimer yesterday; however in light of his tachycardia, persistent symptoms, and new evidence of obstructive lung disease at PCP evaluation yesterday, will pursue further work up with CT PE study. Patient has been seen and evaluated by Dr. Sharyn Lull who does not believe patient's symptoms to be cardiac in nature; symptoms today atypical of ACS.  Patient signed out to Junious Silk, PA-C at shift change for further evaluation and dispo.    Antony Madura, [place] 12/29/12 [phone]

## 2012-12-29 NOTE — ED Notes (Signed)
Pt going to xray via w/c, then to room A11.

## 2012-12-29 NOTE — ED Notes (Signed)
Pt here for chest pain and vomiting onset last pm, had episode of diaphoresis.

## 2012-12-30 ENCOUNTER — Ambulatory Visit: Payer: No Typology Code available for payment source | Admitting: Psychiatry

## 2013-01-03 ENCOUNTER — Ambulatory Visit (INDEPENDENT_AMBULATORY_CARE_PROVIDER_SITE_OTHER): Payer: Medicare Other | Admitting: Family Medicine

## 2013-01-03 ENCOUNTER — Encounter: Payer: Self-pay | Admitting: Family Medicine

## 2013-01-03 VITALS — BP 136/85 | HR 72 | Temp 97.9°F | Ht 72.0 in | Wt 328.0 lb

## 2013-01-03 DIAGNOSIS — R0789 Other chest pain: Secondary | ICD-10-CM

## 2013-01-03 DIAGNOSIS — E7800 Pure hypercholesterolemia, unspecified: Secondary | ICD-10-CM

## 2013-01-03 DIAGNOSIS — I10 Essential (primary) hypertension: Secondary | ICD-10-CM

## 2013-01-03 DIAGNOSIS — Z79899 Other long term (current) drug therapy: Secondary | ICD-10-CM

## 2013-01-03 MED ORDER — DICLOFENAC SODIUM 50 MG PO TBEC: 50.0000 mg | DELAYED_RELEASE_TABLET | Freq: Three times a day (TID) | ORAL | Status: DC

## 2013-01-03 NOTE — Progress Notes (Signed)
Patient ID: [name], male   DOB: [birth year], 42 y.o.   MRN: [id number]  [name] Amber M. Hairford, MD Phone: [phone]   Subjective: HPI: Patient is a 42 y.o. male presenting to clinic today for follow up appointment. Concerns today include continued chest pain and needs medication changes  1. Chest pain- Went to ED on Wednesday. Chest pain was more severe and he was vomiting. He has slightly elevated d.dimer but negative CTA. He was given Flexeril, Prednisone and Zantac which did not make him feel any better. He states his pain is the same. No SOB or wheezing, but hurts when he takes a deep breath.  2. Medication changes- Needs different NSAID and Claritin changed because of insurance changes. He has been stable on these medications but would like something covered under his insurance  History Reviewed: Non- smoker. Health Maintenance: Needs labs done for cardiology and mental health.  ROS: Please see HPI above.  Objective: Office vital signs reviewed. BP 136/85  Pulse 72  Temp(Src) 97.9 F (36.6 C) (Oral)  Ht 6' (1.829 m)  Wt 328 lb (148.78 kg)  BMI 44.48 kg/m2  SpO2 97%  Physical Examination:  General: Awake, alert. NAD HEENT: Atraumatic, normocephalic. MMM Pulm: CTAB, no wheezes. Decreased inspiratory effort Cardio: RRR, no murmurs appreciated. TTP of chest left of sternal border Abdomen:+BS, soft, nontender, nondistended Extremities: No edema Neuro: Grossly intact  Assessment: 42 y.o. male follow up  Plan: See Problem List and After Visit Summary

## 2013-01-03 NOTE — Patient Instructions (Signed)
Make a morning appointment for labs before breakfast. You can drink water only.  I changed your headache medicine to Diclofenac 50mg  three times daily as needed. You can pick that up at [facility].  For your allergy medicine, try getting generic Loratadine over the counter (in the cheap section of [employer]!)  I think your pain is from pleurisy, and should go away on its own with time. Use heating pad and pain medication.  We will recheck your lungs after Thanksgiving.  Katharin Schneider M. Dolora Ridgely, M.D.

## 2013-01-05 NOTE — ED Provider Notes (Signed)
Medical screening examination/treatment/procedure(s) were conducted as a shared visit with non-physician practitioner(s) and myself.  I personally evaluated the patient during the encounter.  EKG Interpretation    Date/Time:    Ventricular Rate:  98 PR Interval:  152 QRS Duration: 86 QT Interval:  338 QTC Calculation: 431 R Axis:   -71 Text Interpretation:  Normal sinus rhythm Left axis deviation Anterior infarct , age undetermined Abnormal ECG ED PHYSICIAN INTERPRETATION AVAILABLE IN [facility]            Pt with pleuritic type chest pain, mildly elevated d-dimer. Exam is benign:  Filed Vitals:   12/29/12 1236  BP: 129/62  Pulse: 80  Temp: 98.5 F (36.9 C)  Resp: 17    CT PE pending. Cardiac cause ruled out by Dr. Sharyn Lull at previous visit.  Derwood Kaplan, MD 01/05/13 [phone]

## 2013-01-05 NOTE — Assessment & Plan Note (Signed)
Pain continues, but perhaps slightly better based on his appearance today. I think this is pleurisy. Con't NSAID (prescribed Diclofenac which is covered by his new insurance) and encouraged deep breathing. F/u within next 2-4 weeks or sooner if needed.

## 2013-01-06 ENCOUNTER — Ambulatory Visit (INDEPENDENT_AMBULATORY_CARE_PROVIDER_SITE_OTHER): Payer: No Typology Code available for payment source | Admitting: Psychiatry

## 2013-01-06 DIAGNOSIS — F209 Schizophrenia, unspecified: Secondary | ICD-10-CM

## 2013-01-10 ENCOUNTER — Other Ambulatory Visit: Payer: Self-pay

## 2013-01-11 ENCOUNTER — Telehealth: Payer: Self-pay | Admitting: Family Medicine

## 2013-01-11 NOTE — Telephone Encounter (Signed)
Spoke with patient. He continues to have pain, unchanged. Had one episode of N/V. Still using his pain medication, heating pad and resting. He is coming to clinic in the morning for labwork. I have encouraged him to see the same day doctor (which might be me?) in the morning if he is concerned. He agrees with this plan.  Lance Luna Lance Luna, LanceD. 01/11/2013 2:11 PM

## 2013-01-11 NOTE — Telephone Encounter (Signed)
RB patient, last seen 07/2011 Patient called requesting to be seen for left chest pain Chart reviewed, extensive work up in past, with recent ED visit and CT scan and PCP visit TODAY.  Symptoms he describes are similar to what has been described prior.  Advised that he should call our clinic in AM to schedule an appointment.  Advised that he follow his PCP's recommendations until then.

## 2013-01-11 NOTE — Telephone Encounter (Signed)
Pt states her chest is still hurting. He is asking dr Mikel Cella to call him

## 2013-01-12 ENCOUNTER — Other Ambulatory Visit (INDEPENDENT_AMBULATORY_CARE_PROVIDER_SITE_OTHER): Payer: Medicare Other

## 2013-01-12 DIAGNOSIS — I10 Essential (primary) hypertension: Secondary | ICD-10-CM

## 2013-01-12 DIAGNOSIS — Z79899 Other long term (current) drug therapy: Secondary | ICD-10-CM

## 2013-01-12 DIAGNOSIS — E669 Obesity, unspecified: Secondary | ICD-10-CM

## 2013-01-12 DIAGNOSIS — E7800 Pure hypercholesterolemia, unspecified: Secondary | ICD-10-CM

## 2013-01-12 LAB — CBC WITH DIFFERENTIAL/PLATELET
Basophils Relative: 1 % (ref 0–1)
Eosinophils Relative: 1 % (ref 0–5)
HCT: 46.6 % (ref 39.0–52.0)
Lymphocytes Relative: 18 % (ref 12–46)
Lymphs Abs: 1.9 10*3/uL (ref 0.7–4.0)
MCH: 30.7 pg (ref 26.0–34.0)
MCV: 89.4 fL (ref 78.0–100.0)
Monocytes Absolute: 0.6 10*3/uL (ref 0.1–1.0)
Neutrophils Relative %: 74 % (ref 43–77)
RBC: 5.21 MIL/uL (ref 4.22–5.81)
RDW: 14.7 % (ref 11.5–15.5)
WBC: 10.9 10*3/uL — ABNORMAL HIGH (ref 4.0–10.5)

## 2013-01-12 NOTE — Progress Notes (Signed)
CMP,CBC WITH DIFF,FLP AND AIC  DONE TODAY [place] [facility]

## 2013-01-13 LAB — COMPREHENSIVE METABOLIC PANEL
ALT: 58 U/L — ABNORMAL HIGH (ref 0–53)
Alkaline Phosphatase: 60 U/L (ref 39–117)
CO2: 32 mEq/L (ref 19–32)
Creat: 0.91 mg/dL (ref 0.50–1.35)
Total Bilirubin: 0.7 mg/dL (ref 0.3–1.2)

## 2013-01-13 LAB — LIPID PANEL
HDL: 37 mg/dL — ABNORMAL LOW (ref >39)
LDL Cholesterol: 72 mg/dL (ref 0–99)
Total CHOL/HDL Ratio: 4 Ratio
Triglycerides: 202 mg/dL — ABNORMAL HIGH (ref <150)

## 2013-01-14 ENCOUNTER — Ambulatory Visit (INDEPENDENT_AMBULATORY_CARE_PROVIDER_SITE_OTHER): Payer: Medicare Other | Admitting: Critical Care Medicine

## 2013-01-14 ENCOUNTER — Encounter: Payer: Self-pay | Admitting: Critical Care Medicine

## 2013-01-14 VITALS — BP 140/96 | HR 97 | Temp 98.6°F | Ht 72.0 in | Wt 335.6 lb

## 2013-01-14 DIAGNOSIS — R0789 Other chest pain: Secondary | ICD-10-CM

## 2013-01-14 DIAGNOSIS — M940 Chondrocostal junction syndrome [Tietze]: Secondary | ICD-10-CM

## 2013-01-14 MED ORDER — MELOXICAM 15 MG PO TABS: 15.0000 mg | ORAL_TABLET | Freq: Every day | ORAL | Status: DC

## 2013-01-14 NOTE — Progress Notes (Signed)
Subjective:    Patient ID: Lance Luna, male    DOB: 03-03-1970, 42 y.o.   MRN: [id number]  HPI HPI 42 yo man, hx of HTN, OSA on CPAP, allergic rhinitis, mild MR.  He was well until Feb '13 when he sounds like he caught URI. Most sx resolved but he has been left w persistent dry cough. Has been rx with cough suppression, tessalon pearls. Also was on omeprazole and loratadine already. Fluticasone nasal spray added in Feb'13, has been using prn. Having some occasional L chest wall pain.  He believes he has had spirometry before (? At [facility]), ? Full PFT.   CXR 05/09/11 -- clear   ROV 08/08/11 -- HTN, OSA on CPAP, allergic rhinitis, mild MR.  Reports that his cough is better but that he is having more L sided CP, can happen any time. Dr Sharyn Lull has evaluated > reassuring.  He has been using albuterol prn, about every few days.   01/14/2013 Chief Complaint  Patient presents with  . Acute Visit    Pt c/o pain in his chest on the left side when he takes a breath in x 3 weeks.  Pt denies any increase in cough at this time.  Pt has a CXR and CT chest on 12-29-12 at [place].   Pt last seen 07/2011.  RB office pt Pt notes if takes a deep breath, pain.  ? Dx pleurisy per pcp Has been at ED 12/29/12.  Rx muscle relaxant and vicodan and not helped. Pain on L is sharp pain, episode of emesis with same.  No heartburn. No excess gas. Notes some wheezing.  Notes some dyspnea with exertion. No help with albuterol use      Review of Systems Constitutional:   No  weight loss, night sweats,  Fevers, chills, fatigue, lassitude. HEENT:   No headaches,  Difficulty swallowing,  Tooth/dental problems,  Sore throat,                No sneezing, itching, ear ache, nasal congestion, post nasal drip,   CV  ++ chest pain,  Orthopnea, PND, swelling in lower extremities, anasarca, dizziness, palpitations  GI  No heartburn, indigestion, abdominal pain, nausea, vomiting, diarrhea, change in bowel habits, loss of  appetite  Resp: ++ shortness of breath with exertion or at rest.  No excess mucus, no productive cough,  No non-productive cough,  No coughing up of blood.  No change in color of mucus.  No wheezing.  No chest wall deformity  Skin: no rash or lesions.  GU: no dysuria, change in color of urine, no urgency or frequency.  No flank pain.  MS:  No joint pain or swelling.  No decreased range of motion.  No back pain.  Psych:  No change in mood or affect. No depression or anxiety.  No memory loss.     Objective:   Physical Exam Filed Vitals:   01/14/13 1053  BP: 140/96  Pulse: 97  Temp: 98.6 F (37 C)  TempSrc: Oral  Height: 6' (1.829 m)  Weight: 335 lb 9.6 oz (152.227 kg)  SpO2: 92%    Gen: Pleasant, well-nourished, in no distress,  normal affect  ENT: No lesions,  mouth clear,  oropharynx clear, no postnasal drip  Neck: No JVD, no TMG, no carotid bruits  Lungs: No use of accessory muscles, no dullness to percussion, clear without rales or rhonchi point tenderness on left anterior chest wall costochondral junction  Cardiovascular: RRR, heart  sounds normal, no murmur or gallops, no peripheral edema  Abdomen: soft and NT, no HSM,  BS normal  Musculoskeletal: No deformities, no cyanosis or clubbing  Neuro: alert, non focal  Skin: Warm, no lesions or rashes  No results found.  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 albuterol (PROVENTIL HFA;VENTOLIN HFA) 108 (90 BASE) MCG/ACT inhaler Inhale 2 puffs into the lungs every 2 (two) hours as needed for  wheezing or shortness of breath (cough).  . ARIPiprazole (ABILIFY) 10 MG tablet Take 10 mg by mouth daily.  . baclofen (LIORESAL) 10 MG tablet Take 10 mg by mouth 2 (two) times daily.  Marland Kitchen FLUoxetine (PROZAC) 40 MG capsule Take 40 mg by mouth daily.   Marland Kitchen gabapentin (NEURONTIN) 600 MG tablet Take 2 tablets (1,200 mg total) by mouth 3 (three) times daily.  Marland Kitchen lisinopril-hydrochlorothiazide (PRINZIDE,ZESTORETIC) 20-25 MG per tablet Take 1 tablet by mouth daily.  Marland Kitchen loratadine (CLARITIN) 10 MG tablet Take 1 tablet (10 mg total) by mouth daily.  Marland Kitchen omeprazole (PRILOSEC) 40 MG capsule Take 1 capsule (40 mg total) by mouth daily.  . ranitidine (ZANTAC) 150 MG capsule Take 1 capsule (150 mg total) by mouth daily.  . traMADol (ULTRAM) 50 MG tablet Take 1 tablet (50 mg total) by mouth every 8 (eight) hours as needed. For pain  . traZODone (DESYREL) 100 MG tablet Take 200 mg by mouth at bedtime.   . [DISCONTINUED] cyclobenzaprine (FLEXERIL) 10 MG tablet Take 1 tablet (10 mg total) by mouth 2 (two) times daily as needed for muscle spasms.  . [DISCONTINUED] diclofenac (VOLTAREN) 50 MG EC tablet Take 1 tablet (50 mg total) by mouth 3 (three) times daily.  . [DISCONTINUED] HYDROcodone-acetaminophen (NORCO/VICODIN) 5-325 MG per tablet Take 1 tablet by mouth every 6 (six) hours as needed for moderate pain.  . [DISCONTINUED] pantoprazole (PROTONIX) 20 MG tablet Take 1 tablet (20 mg total) by mouth daily.  . meloxicam (MOBIC) 15 MG tablet Take 1 tablet (15 mg total) by mouth daily.  . [DISCONTINUED] predniSONE (DELTASONE) 20 MG tablet 3 tabs po day one, then 2 tabs daily x 4 days

## 2013-01-14 NOTE — Assessment & Plan Note (Addendum)
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

## 2013-01-14 NOTE — Progress Notes (Deleted)
   Subjective:    Patient ID: Lance Luna, male    DOB: 1970-04-13, 42 y.o.   MRN: [id number]  HPI  Subjective:    Patient ID: Lance Luna, male    DOB: 04/11/1970, 42 y.o.   MRN: [id number] HPI 42 yo man, hx of HTN, OSA on CPAP, allergic rhinitis, mild MR.  He was well until Feb '13 when he sounds like he caught URI. Most sx resolved but he has been left w persistent dry cough. Has been rx with cough suppression, tessalon pearls. Also was on omeprazole and loratadine already. Fluticasone nasal spray added in Feb'13, has been using prn. Having some occasional L chest wall pain.  He believes he has had spirometry before (? At [facility]), ? Full PFT.   CXR 05/09/11 -- clear   ROV 08/08/11 -- HTN, OSA on CPAP, allergic rhinitis, mild MR.  Reports that his cough is better but that he is having more L sided CP, can happen any time. Dr Sharyn Lull has evaluated > reassuring.  He has been using albuterol prn, about every few days.   01/14/2013 Chief Complaint  Patient presents with  . Acute Visit    Pt c/o pain in his chest on the left side when he takes a breath in x 3 weeks.  Pt denies any increase in cough at this time.  Pt has a CXR and CT chest on 12-29-12 at [place].   Pt last seen 07/2011.  RB office pt Pt notes if takes a deep breath, pain.  ? Dx pleurisy per pcp Has been at ED 12/29/12.  Rx muscle relaxant and vicodan and not helped. Pain on L is sharp pain, episode of emesis with same.  No heartburn. No excess gas. Notes some wheezing.  Notes some dyspnea with exertion. No help with albuterol use      Objective:   Physical Exam Filed Vitals:   01/14/13 1053  BP: 140/96  Pulse: 97  Temp: 98.6 F (37 C)   Gen: Pleasant, obese, in no distress,  normal affect, some simple responses but well-oriented  ENT: No lesions,  mouth clear,  oropharynx clear, no postnasal drip  Neck: No JVD, no TMG, no carotid bruits  Lungs: No use of accessory muscles, no dullness to percussion, clear without  rales or rhonchi  Cardiovascular: RRR, heart sounds normal, no murmur or gallops, no peripheral edema  Musculoskeletal: No deformities, no cyanosis or clubbing  Neuro: alert, non focal  Skin: Warm, no lesions or rashes     Assessment & Plan:  No problem-specific assessment & plan notes found for this encounter.     Review of Systems     Objective:   Physical Exam        Assessment & Plan:

## 2013-01-14 NOTE — Patient Instructions (Signed)
Stop voltaren/diclofenac Stop vicodan Start meloxicam 15mg  daily Stay on omeprazole  Finish zantac daily You do not have a primary lung problem You have costocondritis

## 2013-01-27 ENCOUNTER — Ambulatory Visit (INDEPENDENT_AMBULATORY_CARE_PROVIDER_SITE_OTHER): Payer: No Typology Code available for payment source | Admitting: Psychiatry

## 2013-01-27 DIAGNOSIS — F209 Schizophrenia, unspecified: Secondary | ICD-10-CM

## 2013-01-31 ENCOUNTER — Encounter: Payer: Self-pay | Admitting: Family Medicine

## 2013-02-03 ENCOUNTER — Ambulatory Visit (INDEPENDENT_AMBULATORY_CARE_PROVIDER_SITE_OTHER): Payer: No Typology Code available for payment source | Admitting: Psychiatry

## 2013-02-03 DIAGNOSIS — F209 Schizophrenia, unspecified: Secondary | ICD-10-CM

## 2013-02-04 ENCOUNTER — Encounter: Payer: Self-pay | Admitting: Family Medicine

## 2013-02-21 ENCOUNTER — Ambulatory Visit: Payer: Self-pay | Admitting: Home Health Services

## 2013-02-22 ENCOUNTER — Ambulatory Visit (INDEPENDENT_AMBULATORY_CARE_PROVIDER_SITE_OTHER): Payer: Medicare Other | Admitting: Home Health Services

## 2013-02-22 VITALS — Wt 335.0 lb

## 2013-02-22 DIAGNOSIS — E669 Obesity, unspecified: Secondary | ICD-10-CM

## 2013-02-22 NOTE — Progress Notes (Signed)
Patient Identified Concern:  weightloss Stage of Change Patient Is In:  Contemplation-planning on making changes within the next 6 months Patient Reported Barriers:  Unhealthy habits, lack of motivation Patient Reported Perceived Benefits:  Living longer, better health Patient Reports Self-Efficacy:   Pt verbalizes that he feels he can lose weight. Behavior Change Supports:  friends Goals:  For next two weeks exercise every Tuesday/Thursday at 10 am at the [facility] [employer] [facility] (10 minutes bicycle, 4 laps around track)  Eat 1 plate of food at meal time.  Follow up with health coach in 2 weeks telephonically. Patient Education:  We talked about strategies to lose weight.  Pt identified exercise and portion control as important and realistic for him to work on.

## 2013-02-24 ENCOUNTER — Ambulatory Visit (INDEPENDENT_AMBULATORY_CARE_PROVIDER_SITE_OTHER): Payer: Medicare HMO | Admitting: Psychiatry

## 2013-02-24 DIAGNOSIS — F209 Schizophrenia, unspecified: Secondary | ICD-10-CM

## 2013-03-03 ENCOUNTER — Ambulatory Visit: Payer: Medicare HMO | Admitting: Psychiatry

## 2013-03-09 ENCOUNTER — Telehealth: Payer: Self-pay | Admitting: Family Medicine

## 2013-03-09 NOTE — Telephone Encounter (Signed)
Spoke with patient.  He will come by and sign a ROI at which time we will fax notes. Fleeger, Maryjo RochesterJessica Dawn

## 2013-03-09 NOTE — Telephone Encounter (Signed)
Pt called and would like the doctor to fax a copy of his blood work done in December and a medication list to [facility] [phone]. jw

## 2013-03-10 ENCOUNTER — Ambulatory Visit: Payer: Medicare HMO | Admitting: Psychiatry

## 2013-03-17 ENCOUNTER — Telehealth: Payer: Self-pay | Admitting: Critical Care Medicine

## 2013-03-17 ENCOUNTER — Ambulatory Visit (INDEPENDENT_AMBULATORY_CARE_PROVIDER_SITE_OTHER): Payer: Medicare HMO | Admitting: Psychiatry

## 2013-03-17 DIAGNOSIS — F209 Schizophrenia, unspecified: Secondary | ICD-10-CM

## 2013-03-17 NOTE — Telephone Encounter (Signed)
Called and spoke with pt. He is requesting a refill on meloxicam. Last refilled by PW at [facility] 01/14/13 #30 x 0 refills. Pt last seen RB 08/08/11. Pt was not advised when to follow up. Please advise RB thanks

## 2013-03-18 NOTE — Telephone Encounter (Signed)
Dr. Delton CoombesByrum is scheduled off this afternoon. Please advise Dr. Delford FieldWright thanks

## 2013-03-18 NOTE — Telephone Encounter (Signed)
Pt is concerned that he hasn't heard anything back about this refill.  Would like an answer.  Antionette FairyHolly D Pryor

## 2013-03-21 ENCOUNTER — Telehealth: Payer: Self-pay | Admitting: Family Medicine

## 2013-03-21 NOTE — Telephone Encounter (Signed)
Per last [facility] note: Stop voltaren/diclofenac  Stop vicodan  Start meloxicam 15mg  daily  Stay on omeprazole  Finish zantac daily  You do not have a primary lung problem  You have costocondritis  LMTCBx2 with the pt to advise him to contact PCP if he is having issues since he does not have a primary lung problem. Carron CurieJennifer Castillo, CMA

## 2013-03-21 NOTE — Telephone Encounter (Signed)
Thank you :)

## 2013-03-21 NOTE — Telephone Encounter (Signed)
Returning call can be reached at [phone].Lance EvertsJuanita S Davis

## 2013-03-21 NOTE — Telephone Encounter (Signed)
I called and spoke with pt. He will call PCP. Nothing further needed

## 2013-03-21 NOTE — Telephone Encounter (Signed)
Needs to talk to [name] about getting medicine refille. Did not say what-just wants to talk to hairford.

## 2013-03-22 ENCOUNTER — Other Ambulatory Visit: Payer: Self-pay | Admitting: Family Medicine

## 2013-03-22 MED ORDER — MELOXICAM 15 MG PO TABS: 15.0000 mg | ORAL_TABLET | Freq: Every day | ORAL | Status: DC

## 2013-03-22 NOTE — Telephone Encounter (Signed)
[facility]. He should not be taking Diclofenac or Vicodin with this medication.  Thank you, Charlina Dwight M. Caylan Chenard, M.D.

## 2013-03-22 NOTE — Telephone Encounter (Signed)
LMOM for pt to call back.  Please give message from MD when he does. Jazmin Hartsell,CMA

## 2013-03-22 NOTE — Telephone Encounter (Signed)
Pt states that he was given mobic for his inflammation by his pulmonologist.  States that the medication really helped and he is now out and pulmonologist said he needed to contact you to refill.  Please advise. Jazmin Hartsell,CMA

## 2013-03-23 ENCOUNTER — Telehealth: Payer: Self-pay | Admitting: Family Medicine

## 2013-03-23 NOTE — Telephone Encounter (Signed)
I received orders on Jan 30. I do not see a sleep study in his chart. Can we confirm that he has had one. If not, that will need to be arranged first.  Thanks, Continental Airlinesmber M. Thamar Holik, M.D.

## 2013-03-23 NOTE — Telephone Encounter (Signed)
Lance Luna faxed over orders for C-Pap supplies. It was faxed on jan 23.   She also wants a copy of the sleep study report She has not received either one Please advise

## 2013-03-23 NOTE — Telephone Encounter (Signed)
Have you received these or do I need to ask them to resend? Fleeger, Maryjo RochesterJessica Dawn

## 2013-03-24 ENCOUNTER — Ambulatory Visit: Payer: Medicare HMO | Admitting: Psychiatry

## 2013-03-24 NOTE — Telephone Encounter (Signed)
LMOVM for Tobi Bastosnna to return call. Please relay the following:  1. Dr. Vickey SagesHariford received the forms but it was not until 03/18/2013  2.  Pt had a sleep study (under notes from 01/22/08 & 12/25/2007) are these recent enough or does he need a new one?  Jalaila Caradonna, Maryjo RochesterJessica Dawn

## 2013-03-25 NOTE — Telephone Encounter (Signed)
Informed Lance Luna that Dr. [name] did receive paperwork on 03/18/13. Lance Luna states that sleep study from 2009 will be perfectly fine as long as it is a signed diagnostic sleep study.

## 2013-03-28 NOTE — Telephone Encounter (Signed)
Form faxed back. Fleeger, Maryjo RochesterJessica Dawn

## 2013-03-28 NOTE — Telephone Encounter (Signed)
Form signed and returned to blue team.  Thank you, Jhan Conery M. Duanna Runk, M.D.

## 2013-03-30 ENCOUNTER — Encounter: Payer: Self-pay | Admitting: Family Medicine

## 2013-03-30 ENCOUNTER — Ambulatory Visit (INDEPENDENT_AMBULATORY_CARE_PROVIDER_SITE_OTHER): Payer: Medicare HMO | Admitting: Family Medicine

## 2013-03-30 ENCOUNTER — Telehealth: Payer: Self-pay | Admitting: Family Medicine

## 2013-03-30 VITALS — BP 135/80 | HR 76 | Temp 98.0°F | Wt 339.0 lb

## 2013-03-30 DIAGNOSIS — R0789 Other chest pain: Secondary | ICD-10-CM

## 2013-03-30 DIAGNOSIS — I10 Essential (primary) hypertension: Secondary | ICD-10-CM

## 2013-03-30 MED ORDER — OMEPRAZOLE 40 MG PO CPDR: 40.0000 mg | DELAYED_RELEASE_CAPSULE | Freq: Every day | ORAL | Status: AC

## 2013-03-30 MED ORDER — CYCLOBENZAPRINE HCL 10 MG PO TABS: 10.0000 mg | ORAL_TABLET | Freq: Two times a day (BID) | ORAL | Status: DC | PRN

## 2013-03-30 MED ORDER — MELOXICAM 15 MG PO TABS: 15.0000 mg | ORAL_TABLET | Freq: Every day | ORAL | Status: DC

## 2013-03-30 NOTE — Telephone Encounter (Signed)
[facility] called and needs the sleep study faxed to them and also said that the first paperwork that was faxed was missing some information and that they resent it us. Can we fax everything back to [phone]. jw

## 2013-03-30 NOTE — Telephone Encounter (Signed)
LMOVM of Lance Luna asking her to return call.  2 things:  1. What is missing from the form  2. I faxed the sleep study from 2009, is there something else that is needed? Fleeger, Maryjo RochesterJessica Dawn

## 2013-03-30 NOTE — Progress Notes (Signed)
Patient ID: Lance Luna, male   DOB: 08/18/1970, 43 y.o.   MRN: [id number]    Subjective: HPI: Patient is a 43 y.o. male presenting to clinic today for follow up. Concerns today include blood pressure and medication refill  1. HTN - patient wanted to check his blood pressure and make sure his medications were correct. BP is 135/80 today and has been well controlled. He denies any problems with his medications. No HA, new CP, abd pain, leg swelling or changes in vision. Overall, doing well.  2. Medication refill- Patient with costochondritis. Mobic helps him and requesting refill. Renal function good at last check. He is not taking Diclofenac or VIcodin currently. He is on PPI.   History Reviewed: Non smoker. Health Maintenance: UTD, needs labs in May  ROS: Please see HPI above.  Objective: Office vital signs reviewed. BP 135/80  Pulse 76  Temp(Src) 98 F (36.7 C) (Oral)  Wt 339 lb (153.769 kg)  Physical Examination:  General: Awake, alert. NAD HEENT: Atraumatic, normocephalic. Wears glasses Neck: No masses palpated. No LAD Pulm: CTAB, no wheezes Cardio: RRR, no murmurs appreciated Abdomen:+BS, soft, nontender, nondistended Extremities: Trace LE edema. Dry, erythematous plaques on right thumb and right calf. Neuro: Grossly intact  Assessment: 43 y.o. male follow up  Plan: See Problem List and After Visit Summary

## 2013-03-30 NOTE — Patient Instructions (Signed)
It was good to see you today.  I will see you back for a physical in May. We will do your blood work again then.  Take care! Laporsha Grealish M. Lejuan Botto, M.D.

## 2013-03-31 ENCOUNTER — Ambulatory Visit (INDEPENDENT_AMBULATORY_CARE_PROVIDER_SITE_OTHER): Payer: Medicare HMO | Admitting: Psychiatry

## 2013-03-31 DIAGNOSIS — F209 Schizophrenia, unspecified: Secondary | ICD-10-CM

## 2013-03-31 NOTE — Assessment & Plan Note (Signed)
MSK in nature. Evaluated by pulm and cards. Mobic helps, refill given. F/u in 6-8 weeks prn.

## 2013-03-31 NOTE — Assessment & Plan Note (Signed)
At goal on Lisinopril-HCTZ. Will monitor renal function on NSAID, labs in May. F/u prn or 3 months.

## 2013-04-01 ENCOUNTER — Telehealth: Payer: Self-pay | Admitting: Family Medicine

## 2013-04-01 NOTE — Telephone Encounter (Signed)
Tobi Bastosnna from [facility] called back to say theta they need the diagnostics report of the sleep study. They also said that the first form that was filled out and sent was not readable. Please refax and also send the diagnostics. Please fax to [phone]. jw

## 2013-04-04 NOTE — Telephone Encounter (Signed)
refaxed to [facility]. Fleeger, Lance Luna

## 2013-04-07 ENCOUNTER — Ambulatory Visit (INDEPENDENT_AMBULATORY_CARE_PROVIDER_SITE_OTHER): Payer: Medicare HMO | Admitting: Psychiatry

## 2013-04-07 ENCOUNTER — Encounter (HOSPITAL_COMMUNITY): Payer: Self-pay | Admitting: Emergency Medicine

## 2013-04-07 ENCOUNTER — Emergency Department (HOSPITAL_COMMUNITY)
Admission: EM | Admit: 2013-04-07 | Discharge: 2013-04-07 | Disposition: A | Discharge status: Home / Self Care | Payer: Medicare HMO | Attending: Emergency Medicine | Admitting: Emergency Medicine

## 2013-04-07 ENCOUNTER — Emergency Department (HOSPITAL_COMMUNITY): Payer: Medicare HMO

## 2013-04-07 DIAGNOSIS — G8929 Other chronic pain: Secondary | ICD-10-CM | POA: Insufficient documentation

## 2013-04-07 DIAGNOSIS — Z87828 Personal history of other (healed) physical injury and trauma: Secondary | ICD-10-CM | POA: Insufficient documentation

## 2013-04-07 DIAGNOSIS — M545 Low back pain, unspecified: Secondary | ICD-10-CM | POA: Insufficient documentation

## 2013-04-07 DIAGNOSIS — Z8659 Personal history of other mental and behavioral disorders: Secondary | ICD-10-CM | POA: Insufficient documentation

## 2013-04-07 DIAGNOSIS — F209 Schizophrenia, unspecified: Secondary | ICD-10-CM

## 2013-04-07 DIAGNOSIS — Z8619 Personal history of other infectious and parasitic diseases: Secondary | ICD-10-CM | POA: Insufficient documentation

## 2013-04-07 DIAGNOSIS — Z9889 Other specified postprocedural states: Secondary | ICD-10-CM | POA: Insufficient documentation

## 2013-04-07 DIAGNOSIS — Z79899 Other long term (current) drug therapy: Secondary | ICD-10-CM | POA: Insufficient documentation

## 2013-04-07 DIAGNOSIS — I10 Essential (primary) hypertension: Secondary | ICD-10-CM | POA: Insufficient documentation

## 2013-04-07 DIAGNOSIS — Z791 Long term (current) use of non-steroidal anti-inflammatories (NSAID): Secondary | ICD-10-CM | POA: Insufficient documentation

## 2013-04-07 DIAGNOSIS — M549 Dorsalgia, unspecified: Secondary | ICD-10-CM

## 2013-04-07 MED ORDER — METHOCARBAMOL 500 MG PO TABS: 500.0000 mg | ORAL_TABLET | Freq: Two times a day (BID) | ORAL | Status: DC | PRN

## 2013-04-07 MED ORDER — DIAZEPAM 5 MG PO TABS
5.0000 mg | ORAL_TABLET | Freq: Once | ORAL | Status: DC
Start: 2013-04-07 — End: 2013-04-07
  Filled 2013-04-07: qty 1

## 2013-04-07 MED ORDER — HYDROCODONE-ACETAMINOPHEN 5-325 MG PO TABS: 2.0000 | ORAL_TABLET | ORAL | Status: DC | PRN

## 2013-04-07 MED ORDER — PREDNISONE 20 MG PO TABS: 40.0000 mg | ORAL_TABLET | Freq: Every day | ORAL | Status: DC

## 2013-04-07 MED ORDER — KETOROLAC TROMETHAMINE 60 MG/2ML IM SOLN
60.0000 mg | Freq: Once | INTRAMUSCULAR | Status: AC
  Administered 2013-04-07: 60 mg via INTRAMUSCULAR
  Filled 2013-04-07: qty 2

## 2013-04-07 NOTE — ED Provider Notes (Signed)
CSN: [id number]     Arrival date & time 04/07/13  1600 History   First MD Initiated Contact with Patient 04/07/13 1613     Chief Complaint  Patient presents with  . Back Pain     (Consider location/radiation/quality/duration/timing/severity/associated sxs/prior Treatment) HPI Comments: Patient is a 43 year old male who presents with gradual onset of lower back pain that started 3 days ago. The pain is aching and moderate to severe and radiates down his left leg. Patient denies any injury. The pain is constant. Movement makes the pain worse. Nothing makes the pain better. Patient has tried Ultram and Flexeril for pain. No associated symptoms. No saddle paresthesias or bladder/bowel incontinence. Patient reports having screws in his back from a previous L3-L4 fusion and has a history of a herniated disc. Patient has not seen his doctor because it "costs too much money." Patient denies IV drug use.     Patient is a 43 y.o. male presenting with back pain.  Back Pain Associated symptoms: no abdominal pain, no chest pain, no dysuria, no fever and no weakness     Past Medical History  Diagnosis Date  . H. pylori infection 10/2001  . Carpal tunnel syndrome of left wrist   . Muscle strain     Multiple  . Mental retardation   . Hypertension   . Chronic headache   . Obstructive sleep apnea    Past Surgical History  Procedure Laterality Date  . Cervical disc surgery  2003  . Esophagogastroduodenoscopy  2003    No ulcers  . Appendectomy  2002  . Lumbar laminectomy  12/2002    L3-4, L4-5  . Carpal tunnel release     Family History  Problem Relation Age of Onset  . Migraines Mother   . Heart disease Paternal Grandfather   . Hypertension Paternal Grandmother   . Stroke Paternal Grandfather   . Lung cancer Paternal Grandmother    History  Substance Use Topics  . Smoking status: Never Smoker   . Smokeless tobacco: Never Used  . Alcohol Use: No    Review of Systems   Constitutional: Negative for fever, chills and fatigue.  HENT: Negative for trouble swallowing.   Eyes: Negative for visual disturbance.  Respiratory: Negative for shortness of breath.   Cardiovascular: Negative for chest pain and palpitations.  Gastrointestinal: Negative for nausea, vomiting, abdominal pain and diarrhea.  Genitourinary: Negative for dysuria and difficulty urinating.  Musculoskeletal: Positive for back pain. Negative for arthralgias and neck pain.  Skin: Negative for color change.  Neurological: Negative for dizziness and weakness.  Psychiatric/Behavioral: Negative for dysphoric mood.      Allergies  Review of patient's allergies indicates no known allergies.  Home Medications   Current Outpatient Rx  Name  Route  Sig  Dispense  Refill  . albuterol (PROVENTIL HFA;VENTOLIN HFA) 108 (90 BASE) MCG/ACT inhaler   Inhalation   Inhale 2 puffs into the lungs every 2 (two) hours as needed for wheezing or shortness of breath (cough).   1 Inhaler   0   . ARIPiprazole (ABILIFY) 10 MG tablet   Oral   Take 10 mg by mouth daily.         . cyclobenzaprine (FLEXERIL) 10 MG tablet   Oral   Take 1 tablet (10 mg total) by mouth 2 (two) times daily as needed for muscle spasms.   30 tablet   2   . FLUoxetine (PROZAC) 40 MG capsule   Oral   Take  40 mg by mouth daily.          Marland Kitchen. gabapentin (NEURONTIN) 600 MG tablet   Oral   Take 2 tablets (1,200 mg total) by mouth 3 (three) times daily.   90 tablet   11   . lisinopril-hydrochlorothiazide (PRINZIDE,ZESTORETIC) 20-25 MG per tablet   Oral   Take 1 tablet by mouth daily.   90 tablet   3   . loratadine (CLARITIN) 10 MG tablet   Oral   Take 1 tablet (10 mg total) by mouth daily.   90 tablet   3   . meloxicam (MOBIC) 15 MG tablet   Oral   Take 1 tablet (15 mg total) by mouth daily.   30 tablet   5   . omeprazole (PRILOSEC) 40 MG capsule   Oral   Take 1 capsule (40 mg total) by mouth daily.   90 capsule    3   . traMADol (ULTRAM) 50 MG tablet   Oral   Take 1 tablet (50 mg total) by mouth every 8 (eight) hours as needed. For pain   30 tablet   2   . traZODone (DESYREL) 100 MG tablet   Oral   Take 200 mg by mouth at bedtime.           BP 107/79  Pulse 92  Temp(Src) 98.2 F (36.8 C) (Oral)  Resp 16  SpO2 95% Physical Exam  Nursing note and vitals reviewed. Constitutional: He is oriented to person, place, and time. He appears well-developed and well-nourished. No distress.  HENT:  Head: Normocephalic and atraumatic.  Eyes: Conjunctivae and EOM are normal.  Neck: Normal range of motion.  Cardiovascular: Normal rate and regular rhythm.  Exam reveals no gallop and no friction rub.   No murmur heard. Pulmonary/Chest: Effort normal and breath sounds normal. He has no wheezes. He has no rales. He exhibits no tenderness.  Abdominal: Soft. He exhibits no distension. There is no tenderness. There is no rebound.  Musculoskeletal: Normal range of motion.  Mild L3-L4 midline tenderness to palpation with associated bilateral lumbar paraspinal tenderness.   Neurological: He is alert and oriented to person, place, and time. Coordination normal.  Speech is goal-oriented. Moves limbs without ataxia. Bilateral lower extremity strength and sensation.   Skin: Skin is warm and dry.  Psychiatric: He has a normal mood and affect. His behavior is normal.    ED Course  Procedures (including critical care time) Labs Review Labs Reviewed - No data to display Imaging Review Dg Lumbar Spine Complete  04/07/2013   CLINICAL DATA:  Low back pain. No recent injuries. Prior lumbar fusion in 2004.  EXAM: LUMBAR SPINE - COMPLETE 4+ VIEW  COMPARISON:  MR L SPINE W/O dated 02/06/2012; DG LUMBAR SPINE COMPLETE dated 02/04/2012; MR L SPINE WO/W CM dated 02/11/2006; MR L SPINE WO/W CM dated 10/25/2005; DG LUMBAR SPINE 2-3 VIEWS dated 09/12/2004  FINDINGS: Five non rib-bearing lumbar vertebrae with L5 representing a  transitional lumbosacral segment, having well defined assimilation joints between its transverse processes and first sacral segment. T12 has small, rudimentary ribs.  Prior L3 through L5 posterolateral fusion with hardware and interbody fusion with plugs at L3-4 and L4-5. No interval change in the slight grade 1 spondylolisthesis of L4 relative to L5 measuring 7 mm, with solid interbody fusion at this level. Solid interbody fusion at L3-4. Hardware intact. No acute fractures. Well preserved disc spaces at L1-2 and L2-3. Mild spondylosis involving the L2 and L3 vertebrae.  Mild lower thoracic spondylosis. No significant facet arthropathy. Visualized sacroiliac joints intact. No significant interval change.  IMPRESSION: 1. No acute osseous abnormality. 2. Solid fusion from L3 through L5 without complicating features, stable dating back to 2006. 3. Transitional L5 segment as previously noted.   Electronically Signed   By: Hulan Saas M.D.   On: 04/07/2013 16:43    EKG Interpretation   None       MDM   Final diagnoses:  Back pain    4:34 PM Lumbar spine xray pending. Vitals stable and patient afebrile. No neuro deficits. No bladder/bowel incontinence or paresthesias. Patient will have toradol and valium for pain.   4:54 PM Xray unremarkable for acute changes. Patient will be discharged with prednisone for sciatica, robaxin for muscle relaxer and vicodin for pain. Patient advised to follow up with PCP. No further evaluation needed at this time.     Emilia Beck, PA-C 04/07/13 1654

## 2013-04-07 NOTE — Discharge Instructions (Signed)
Take Prednisone as directed until gone. Take Robaxin as needed for muscle spasm. Take Vicodin as needed for pain. Follow up with your doctor. Refer to attached documents for more information.

## 2013-04-07 NOTE — ED Notes (Signed)
Pt c/o lower back pain. Pt denies recent injury. Pt states he has screws in his back. Pt is requesting "a cortisone shot". Pt states he has taken Flexeril and Ultram and it is not helping. Pt ambulated to exam room with steady gait.

## 2013-04-07 NOTE — ED Provider Notes (Signed)
Medical screening examination/treatment/procedure(s) were performed by non-physician practitioner and as supervising physician I was immediately available for consultation/collaboration.  EKG Interpretation   None         Candyce ChurnJohn Euan Elira Colasanti III, MD 04/07/13 2318

## 2013-04-09 ENCOUNTER — Telehealth: Payer: Self-pay | Admitting: Family Medicine

## 2013-04-09 NOTE — Telephone Encounter (Signed)
Pt called after hours line  Back pain due to cold weather Endorsing h/o surgery. [facility] but would not procvide it at that time.  Pt desiring epidural injection.  Pt no longer w/ ortho from previous surgery adn told that we do not perform them [facility] or [facility] and that pt would need to establish care w/ a new ortho office.   Shelly Flattenavid Merrell, MD Family Medicine PGY-3 04/09/2013, 8:08 AM

## 2013-04-11 ENCOUNTER — Telehealth: Payer: Self-pay | Admitting: Family Medicine

## 2013-04-11 NOTE — Telephone Encounter (Signed)
LMOVM for pt to return call.  Please inform pt that we do not do epidural shots here.  If it is for back pain it is normally done by ortho or a pain management MD.  Unless he is talking about cortisone shots in knees or shoulder.  Those we do.  Fleeger, Maryjo RochesterJessica Dawn

## 2013-04-11 NOTE — Telephone Encounter (Signed)
Pt called and wanted to know if Dr. [name] give epidural shots. jw

## 2013-04-13 ENCOUNTER — Telehealth: Payer: Self-pay | Admitting: Family Medicine

## 2013-04-13 NOTE — Telephone Encounter (Signed)
Pt is aware of this. Lance Luna,CMA  

## 2013-04-13 NOTE — Telephone Encounter (Signed)
I do not do epidural injections. He has seen a [facility] who can likely do these. He should ask them about that. He has appropriate medications for his pain. He should continue with heat/ice/exercise at home. If severe, he can be seen by same day clinic, urgent care or the ED.  Thank you, Tasmine Hipwell M. Rameses Ou, M.D.

## 2013-04-13 NOTE — Telephone Encounter (Signed)
Pt states that his back is bothering him again.  He said that his pain is about an 8 today.  Would like to know if he can see a specialist for this.  Would like to possibly get an epidural injection.  Please advise. Jazmin Hartsell,CMA

## 2013-04-13 NOTE — Telephone Encounter (Signed)
Patient was originally schedule to see Hairford 2/26 but due to weather appt was cancelled. [facility]. Please advise.

## 2013-04-14 ENCOUNTER — Ambulatory Visit: Payer: Medicare HMO | Admitting: Psychiatry

## 2013-04-14 ENCOUNTER — Ambulatory Visit: Payer: Self-pay | Admitting: Family Medicine

## 2013-04-20 ENCOUNTER — Ambulatory Visit: Payer: No Typology Code available for payment source | Admitting: Psychiatry

## 2013-04-20 ENCOUNTER — Ambulatory Visit: Payer: Medicare HMO | Admitting: Psychiatry

## 2013-04-21 ENCOUNTER — Ambulatory Visit: Payer: Self-pay | Admitting: Family Medicine

## 2013-04-21 ENCOUNTER — Ambulatory Visit (INDEPENDENT_AMBULATORY_CARE_PROVIDER_SITE_OTHER): Payer: Medicare HMO | Admitting: Psychiatry

## 2013-04-21 DIAGNOSIS — F209 Schizophrenia, unspecified: Secondary | ICD-10-CM

## 2013-04-28 ENCOUNTER — Ambulatory Visit: Payer: Medicare HMO | Admitting: Psychiatry

## 2013-05-05 ENCOUNTER — Telehealth: Payer: Self-pay | Admitting: Family Medicine

## 2013-05-05 ENCOUNTER — Ambulatory Visit (INDEPENDENT_AMBULATORY_CARE_PROVIDER_SITE_OTHER): Payer: Medicare HMO | Admitting: Psychiatry

## 2013-05-05 DIAGNOSIS — F209 Schizophrenia, unspecified: Secondary | ICD-10-CM

## 2013-05-05 NOTE — Telephone Encounter (Signed)
Patient states he has been having pain in his fingers, would like to speak to Dr. [name] or nurses about this.

## 2013-05-06 NOTE — Telephone Encounter (Signed)
Pt called back to day to see when the doctor or her nurse is going to call him. Lance Luna

## 2013-05-06 NOTE — Telephone Encounter (Signed)
Spoke with patient and he states that both of his hands are painful across his knuckles and hand.  States that he has not tried anything for it.  He does have tramadol at home and will try this to see if it helps with the pain.  Advised patient that we can make an appt for him if the medication doesn't work or if the pain gets worse.  Voiced understanding. Jazmin Hartsell,CMA

## 2013-05-12 ENCOUNTER — Ambulatory Visit (INDEPENDENT_AMBULATORY_CARE_PROVIDER_SITE_OTHER): Payer: Medicare HMO | Admitting: Psychiatry

## 2013-05-12 DIAGNOSIS — F209 Schizophrenia, unspecified: Secondary | ICD-10-CM

## 2013-05-26 ENCOUNTER — Ambulatory Visit (INDEPENDENT_AMBULATORY_CARE_PROVIDER_SITE_OTHER): Payer: Medicare HMO | Admitting: Psychiatry

## 2013-05-26 DIAGNOSIS — F209 Schizophrenia, unspecified: Secondary | ICD-10-CM

## 2013-05-27 ENCOUNTER — Ambulatory Visit (HOSPITAL_COMMUNITY)
Admission: RE | Admit: 2013-05-27 | Discharge: 2013-05-27 | Disposition: A | Discharge status: Home / Self Care | Payer: Medicare HMO | Source: Ambulatory Visit | Attending: Family Medicine | Admitting: Family Medicine

## 2013-05-27 ENCOUNTER — Ambulatory Visit (INDEPENDENT_AMBULATORY_CARE_PROVIDER_SITE_OTHER): Payer: Medicare HMO | Admitting: Family Medicine

## 2013-05-27 ENCOUNTER — Encounter: Payer: Self-pay | Admitting: Family Medicine

## 2013-05-27 VITALS — BP 117/75 | HR 82 | Temp 98.7°F | Ht 72.0 in | Wt 334.0 lb

## 2013-05-27 DIAGNOSIS — M549 Dorsalgia, unspecified: Secondary | ICD-10-CM

## 2013-05-27 DIAGNOSIS — M545 Low back pain, unspecified: Secondary | ICD-10-CM

## 2013-05-27 DIAGNOSIS — Z981 Arthrodesis status: Secondary | ICD-10-CM | POA: Insufficient documentation

## 2013-05-27 NOTE — Progress Notes (Signed)
Patient ID: Lacie Draftavid L Bartee, male   DOB: 02/11/1971, 43 y.o.   MRN: [id number]    Subjective: HPI: Patient is a 43 y.o. male presenting to clinic today for appointment for back pain.  Back Pain Patient presents for evaluation of low back problems. Symptoms have been present for 1 week and include none. Initial inciting event: Lifting heavy luggage. Symptoms are worse at nighttime. Alleviating factors identifiable by the patient are none. Aggravating factors identifiable by the patient are recumbency and sitting. Treatments initiated by the patient: Vicodin and Flexeril, which did not help. Previous lower back problems: Yes, had back surgery many years ago with intermittent episodes.. Previous work up: X-rays, MRI and evaluation by Neurosurgery. Previous treatments: Yes. Denies fevers, saddle anesthesia, pain or numbness in legs.  On review of medications, patient has bottles for Oxycodone, Hydrocodone, Tramadol, Robaxin and Flexeril. He has pills in all of these. He states he takes Tramadol only for his headaches. He currently is using Vicodin and Flexeril. Robaxin does not work as well for him.  History Reviewed: Non smoker. Health Maintenance: UTD  ROS: Please see HPI above.  Objective: Office vital signs reviewed. BP 117/75  Pulse 82  Temp(Src) 98.7 F (37.1 C) (Oral)  Ht 6' (1.829 m)  Wt 334 lb (151.501 kg)  BMI 45.29 kg/m2  Physical Examination:  General: Awake, alert. NAD, but appears uncomfortable on the exam table HEENT: Atraumatic, normocephalic.MMM Pulm: CTAB, no wheezes Cardio: RRR, no murmurs appreciated Abdomen: Obese, +BS, soft, nontender, nondistended Back: Midline scar in lumbar region, well healed. TTP over scar and both sides. No true spasm appreciated. ROM limited due to discomfort Neuro: Strength and sensation rossly intact  Assessment: 43 y.o. male back pain  Plan: See Problem List and After Visit Summary

## 2013-05-27 NOTE — Patient Instructions (Signed)
Go ahead and get your X-ray today. Based on the findings, we will consider ordering another MRI.  Take the Vicodin as needed and use the Flexeril.  Use heat or ice to your back. Do not rest too much, you need to keep your back active.  Please follow up with [name] sooner. I will let him know exactly what is going on.  Amber M. Hairford, M.D.

## 2013-05-29 ENCOUNTER — Telehealth: Payer: Self-pay | Admitting: Family Medicine

## 2013-05-29 DIAGNOSIS — M549 Dorsalgia, unspecified: Secondary | ICD-10-CM

## 2013-05-29 NOTE — Telephone Encounter (Signed)
Can you please call Mr. Lance Luna and let him know his back X-ray looked fine. Nothing is out of place or broken. He should continue taking the pain medication, and follow up with his neurosurgeon if he needs to.  Thanks! Arissa Fagin M. Namiah Dunnavant, M.D.

## 2013-05-29 NOTE — Assessment & Plan Note (Signed)
A: Acute on chronic back pain after lifting heavy item. Most likely lumbar strain. No true red flags on exam, but given his prior surgery and patient's concerns, will evaluate hardware.  P: - Lumbar film today - Con't Vicodin, Flexeril, ice, and stretching - F/u with neurosurgeon prn - Will meet new PCP at next visit

## 2013-05-30 ENCOUNTER — Telehealth: Payer: Self-pay | Admitting: Family Medicine

## 2013-05-30 NOTE — Telephone Encounter (Signed)
Inform patient of this and states that his pain medication is not working.  Would like to know if he can get an MRI scheduled due to "really bad back pain."  Also he states that he neurosurgeon told him that they didn't feel like he needed them anymore.   He was [facility].  Would like to know if he can get a referral to see a different neurosurgeon.  Please advise.  Jazmin Hartsell,CMA

## 2013-05-30 NOTE — Telephone Encounter (Signed)
Would like to speak to MD/nurse about his back pain.

## 2013-05-30 NOTE — Telephone Encounter (Signed)
Pt called and would like his xray results. jw

## 2013-05-30 NOTE — Telephone Encounter (Signed)
Called patient with results from Dr. [name].  See other phone note from 05-29-13 for more information. Jazmin Hartsell,CMA

## 2013-05-30 NOTE — Telephone Encounter (Signed)
Will forward to MD to find out results. Jazmin Hartsell,CMA

## 2013-05-31 NOTE — Telephone Encounter (Signed)
See previous telephone note. Jazmin Hartsell,CMA  

## 2013-05-31 NOTE — Telephone Encounter (Signed)
Spoke with patient and made him aware of appt for MRI on 06-14-13 @ 1pm.  Also informed him that [place] [facility] are not able to see him due to having no showed too many appts.  He is aware of this and is fine with waiting for a referral to a different neurosurgeon after getting the results of his MRI.  Appt card mailed to patient. Ahmarion Saraceno,CMA

## 2013-05-31 NOTE — Telephone Encounter (Signed)
I have placed order for MRI, which needs to be scheduled. He has seen an [facility], so he can call them to see if they should see him back.  Thanks, Continental Airlinesmber M. Griffin Dewilde, M.D.

## 2013-06-02 ENCOUNTER — Telehealth: Payer: Self-pay | Admitting: Family Medicine

## 2013-06-02 NOTE — Telephone Encounter (Signed)
Pt would like Dr. [name]  To call him concerning his back and where she going to be practicing when she leaves here. jw

## 2013-06-02 NOTE — Telephone Encounter (Signed)
To pts new PCP, regarding his back problems. Princella PellegriniJessica D Fleeger

## 2013-06-06 ENCOUNTER — Ambulatory Visit: Payer: Self-pay | Admitting: Family Medicine

## 2013-06-08 NOTE — Telephone Encounter (Signed)
He can make an appointment with his new PCP (me) if he would like to discuss his back pain and ongoing management. However, he will not be getting Amber's new work Product manageraddress/number.  Thanks,  Ry[employer] IncJimmy

## 2013-06-09 ENCOUNTER — Ambulatory Visit (INDEPENDENT_AMBULATORY_CARE_PROVIDER_SITE_OTHER): Payer: Medicare HMO | Admitting: Psychiatry

## 2013-06-09 DIAGNOSIS — F209 Schizophrenia, unspecified: Secondary | ICD-10-CM

## 2013-06-09 NOTE — Telephone Encounter (Signed)
Thank you all for helping me with this situation. I understand it is hard on everyone involved, although I am glad we are doing it before I leave.  Dr. [name] - This is the patient I previously spoke with you about. I do not know if your phone call would help to resolve this issue but I truly feel he should be seen by a male provider.  Thank you all, Lance Luna, M.D.

## 2013-06-09 NOTE — Telephone Encounter (Signed)
Attempted to call pt back.  Continuous ringing and no machine to answer.  Please inform of the following when he calls back. Princella PellegriniJessica D Arianna Haydon

## 2013-06-09 NOTE — Telephone Encounter (Signed)
Pt is upset that he has a male MD.  Insist on having a male MD and wants "to know who to talk to about this".  States "I told them I wanted a woman doctor"  He also insist that I tell him where Dr. [name] is going, advised that [place] not [place] and I would not be telling him this info because it is against office policy.  Pt continues to demand why he can see a male MD and where Dr. Alfonzo BeersHairfords new place is.  Ask that I send this message to "the people in charge". Princella PellegriniJessica D Fleeger

## 2013-06-09 NOTE — Telephone Encounter (Signed)
Patient returns call.  °

## 2013-06-10 ENCOUNTER — Telehealth: Payer: Self-pay | Admitting: Family Medicine

## 2013-06-10 NOTE — Telephone Encounter (Signed)
See previous phone notes regarding patient's refusal to change to male PCP and repeated requests for Dr. Algis DownsHairford's office info once [facility].  Will route note to Dr. [name].  Altamese Dilling~Jeannette Richardson, BSN, RN-BC

## 2013-06-10 NOTE — Telephone Encounter (Signed)
Pt called and would like Jeannette to call him concerning why he can not have a woman doctor. He also wants to know where Dr. [name] is going when she leaves. jw

## 2013-06-10 NOTE — Telephone Encounter (Signed)
Pt wants [name] to call him ASAP.

## 2013-06-13 ENCOUNTER — Telehealth: Payer: Self-pay | Admitting: *Deleted

## 2013-06-13 DIAGNOSIS — I10 Essential (primary) hypertension: Secondary | ICD-10-CM

## 2013-06-13 NOTE — Telephone Encounter (Signed)
Pt called again wanting to speak to [name]. Tried to explain we were transitioning him to a new dr but he wants to speak with [name].  He would not say why. Says he also needs to speak to Lattie CornsJeannette about getting a male dr. Fredna DowSays if we dont call him back, he is coming up here to speak to [name].

## 2013-06-13 NOTE — Telephone Encounter (Signed)
LM for patient to call back regarding details for MRI.  Spoke with Ray at [employer] and it was approved.  Rescheduled for tomorrow Tuesday 06/14/13 at 1pm.   Informed pt on VM that he needs to arrive at 12pm to get labs at the hospital and then they will take him for his scan.  Asked pt to call back to get details.  Jazmin Hartsell,CMA

## 2013-06-13 NOTE — Telephone Encounter (Signed)
At this time, I will wait for Dr. [name] to speak with Mr. Lance Luna before I return his call directly.  Can someone please explain to him that we are working on transitioning his care to a new doctor. If he has specific concerns, he should leave a message and [name] and I will work together to help him the best we can.  Thanks! Tallia Moehring M. Ioanna Colquhoun, M.D.

## 2013-06-14 ENCOUNTER — Encounter: Payer: Self-pay | Admitting: Family Medicine

## 2013-06-14 ENCOUNTER — Ambulatory Visit (HOSPITAL_COMMUNITY): Admission: RE | Admit: 2013-06-14 | Payer: Medicare HMO | Source: Ambulatory Visit

## 2013-06-14 ENCOUNTER — Ambulatory Visit (HOSPITAL_COMMUNITY)
Admission: RE | Admit: 2013-06-14 | Discharge: 2013-06-14 | Disposition: A | Discharge status: Home / Self Care | Payer: Medicare HMO | Source: Ambulatory Visit | Attending: Family Medicine | Admitting: Family Medicine

## 2013-06-14 DIAGNOSIS — Z981 Arthrodesis status: Secondary | ICD-10-CM | POA: Insufficient documentation

## 2013-06-14 DIAGNOSIS — M545 Low back pain, unspecified: Secondary | ICD-10-CM | POA: Insufficient documentation

## 2013-06-14 DIAGNOSIS — M549 Dorsalgia, unspecified: Secondary | ICD-10-CM

## 2013-06-14 NOTE — Telephone Encounter (Signed)
Pt called again wanting to talk to [name].

## 2013-06-14 NOTE — Telephone Encounter (Signed)
Will forward to [facility] and Dr. [name] who are handling this request. Lance HawthorneJazmin Kaleya Luna,CMA

## 2013-06-15 ENCOUNTER — Telehealth: Payer: Self-pay | Admitting: Family Medicine

## 2013-06-15 ENCOUNTER — Encounter: Payer: Self-pay | Admitting: Family Medicine

## 2013-06-15 DIAGNOSIS — Z758 Other problems related to medical facilities and other health care: Secondary | ICD-10-CM

## 2013-06-15 NOTE — Telephone Encounter (Signed)
CORRECTION I meant to write :I have spoke with [name] and [name] and he will be placed only with MALE providers if possible.

## 2013-06-15 NOTE — Telephone Encounter (Signed)
.   I spoke with mr Lance Luna(Lance Luna witnessed conversation), I told patient his provider had been changed to [name]. He does not ant any make provider, wants only male. Said if we could  Not guarantee him a male provider he would just "track down Dr. [name] at her new practice" and become a patient there. He asked where her new practice was and I informed him we do not give out that information.  He expressed (many times) his dissatisfaction with this plan and said he" does not agree" with the change in provider. He only wants to see male providers.  I explained to him that he had made [name] uncomfortable with his constant demands about the location of her new practice; also, by her report,  when she was laboring with the delivery of her child, he somehow called the hospital switchboard and was put through to her room. She did not wish to speak with him and felt threatened by his persistence in tracking her to labor and delivery at such a personal time..  I have spoken with [name] and [name] (head RN and clinic director) and we are going to try and keep him with male providers only.   I will send him a note about his MRI as he said he had not received any results yet. His new PCP is Dr. [name].

## 2013-06-15 NOTE — Telephone Encounter (Signed)
Olegario MessierKathy See my phone note of same day. Will forward this to you. I have sent him note abouot his MRI (no new changes since his last surgery) and I am copying [name].

## 2013-06-15 NOTE — Telephone Encounter (Signed)
See phone note from 06/15/13 per Dr. [name].  Altamese Dilling~Jeannette Richardson, BSN, RN-BC

## 2013-06-15 NOTE — Telephone Encounter (Signed)
Will forward to [name] to review.  Sandy Haye,CMA

## 2013-06-15 NOTE — Telephone Encounter (Signed)
Per Dr. [name] patient informed that she will contact him this afternoon.  Patient also wanted to know results of MRI taken yesterday.

## 2013-06-15 NOTE — Telephone Encounter (Signed)
Per Dr [name]: He will NOT put  with a male provider.

## 2013-06-15 NOTE — Telephone Encounter (Signed)
Pt called again. He was upset by what Dr [name] told him. He asked to speak to [place]uzanne.  He wants to file a complaint against Dr [name]. Catalina Pizzaold him to write a letter and mail it to Dr [name] since she was the medical director. He says he still wants a male dr and would get his counselor to write a letter explaining why he needed a male dr

## 2013-06-15 NOTE — Telephone Encounter (Signed)
See phone note from 06/15/13 per Dr. Neal.  ~Jeannette Richardson, BSN, RN-BC 

## 2013-06-15 NOTE — Telephone Encounter (Signed)
Patient calls requesting MRI results from yesterday.

## 2013-06-16 ENCOUNTER — Ambulatory Visit (INDEPENDENT_AMBULATORY_CARE_PROVIDER_SITE_OTHER): Payer: Medicare HMO | Admitting: Psychiatry

## 2013-06-16 DIAGNOSIS — F209 Schizophrenia, unspecified: Secondary | ICD-10-CM

## 2013-06-17 ENCOUNTER — Ambulatory Visit: Payer: Self-pay | Admitting: Physician Assistant

## 2013-06-17 DIAGNOSIS — Z0289 Encounter for other administrative examinations: Secondary | ICD-10-CM

## 2013-06-30 ENCOUNTER — Ambulatory Visit (INDEPENDENT_AMBULATORY_CARE_PROVIDER_SITE_OTHER): Payer: Medicare HMO | Admitting: Psychiatry

## 2013-06-30 DIAGNOSIS — F209 Schizophrenia, unspecified: Secondary | ICD-10-CM

## 2013-07-06 NOTE — Telephone Encounter (Signed)
errorr

## 2013-07-14 ENCOUNTER — Ambulatory Visit: Payer: Self-pay | Admitting: Psychiatry

## 2013-07-14 ENCOUNTER — Ambulatory Visit (INDEPENDENT_AMBULATORY_CARE_PROVIDER_SITE_OTHER): Payer: Medicare HMO | Admitting: Psychiatry

## 2013-07-14 DIAGNOSIS — F209 Schizophrenia, unspecified: Secondary | ICD-10-CM

## 2013-07-21 ENCOUNTER — Ambulatory Visit (INDEPENDENT_AMBULATORY_CARE_PROVIDER_SITE_OTHER): Payer: Medicare HMO | Admitting: Psychiatry

## 2013-07-21 DIAGNOSIS — F209 Schizophrenia, unspecified: Secondary | ICD-10-CM

## 2013-07-28 ENCOUNTER — Ambulatory Visit (INDEPENDENT_AMBULATORY_CARE_PROVIDER_SITE_OTHER): Payer: Medicare HMO | Admitting: Psychiatry

## 2013-07-28 DIAGNOSIS — F209 Schizophrenia, unspecified: Secondary | ICD-10-CM

## 2013-08-04 ENCOUNTER — Ambulatory Visit (INDEPENDENT_AMBULATORY_CARE_PROVIDER_SITE_OTHER): Payer: Medicare HMO | Admitting: Psychiatry

## 2013-08-04 DIAGNOSIS — F209 Schizophrenia, unspecified: Secondary | ICD-10-CM

## 2013-08-11 ENCOUNTER — Ambulatory Visit: Payer: Medicare HMO | Admitting: Psychiatry

## 2013-08-25 ENCOUNTER — Ambulatory Visit (INDEPENDENT_AMBULATORY_CARE_PROVIDER_SITE_OTHER): Payer: Medicare HMO | Admitting: Psychiatry

## 2013-08-25 DIAGNOSIS — F209 Schizophrenia, unspecified: Secondary | ICD-10-CM

## 2013-09-01 ENCOUNTER — Ambulatory Visit (INDEPENDENT_AMBULATORY_CARE_PROVIDER_SITE_OTHER): Payer: Medicare HMO | Admitting: Psychiatry

## 2013-09-01 DIAGNOSIS — F209 Schizophrenia, unspecified: Secondary | ICD-10-CM

## 2013-09-08 ENCOUNTER — Ambulatory Visit: Payer: Medicare HMO | Admitting: Psychiatry

## 2013-09-08 ENCOUNTER — Ambulatory Visit (INDEPENDENT_AMBULATORY_CARE_PROVIDER_SITE_OTHER): Payer: Medicare HMO | Admitting: Psychiatry

## 2013-09-08 DIAGNOSIS — F209 Schizophrenia, unspecified: Secondary | ICD-10-CM

## 2013-09-15 ENCOUNTER — Ambulatory Visit (INDEPENDENT_AMBULATORY_CARE_PROVIDER_SITE_OTHER): Payer: Medicare HMO | Admitting: Psychiatry

## 2013-09-15 DIAGNOSIS — F209 Schizophrenia, unspecified: Secondary | ICD-10-CM

## 2013-09-22 ENCOUNTER — Ambulatory Visit: Payer: Medicare HMO | Admitting: Psychiatry

## 2013-09-22 ENCOUNTER — Emergency Department (HOSPITAL_COMMUNITY)
Admission: EM | Admit: 2013-09-22 | Discharge: 2013-09-22 | Disposition: A | Discharge status: Home / Self Care | Payer: Medicare HMO | Attending: Emergency Medicine | Admitting: Emergency Medicine

## 2013-09-22 ENCOUNTER — Encounter (HOSPITAL_COMMUNITY): Payer: Self-pay | Admitting: Emergency Medicine

## 2013-09-22 DIAGNOSIS — Z79899 Other long term (current) drug therapy: Secondary | ICD-10-CM | POA: Insufficient documentation

## 2013-09-22 DIAGNOSIS — Z8619 Personal history of other infectious and parasitic diseases: Secondary | ICD-10-CM | POA: Diagnosis not present

## 2013-09-22 DIAGNOSIS — H53149 Visual discomfort, unspecified: Secondary | ICD-10-CM | POA: Diagnosis not present

## 2013-09-22 DIAGNOSIS — G43909 Migraine, unspecified, not intractable, without status migrainosus: Secondary | ICD-10-CM | POA: Insufficient documentation

## 2013-09-22 DIAGNOSIS — G43809 Other migraine, not intractable, without status migrainosus: Secondary | ICD-10-CM | POA: Diagnosis not present

## 2013-09-22 DIAGNOSIS — Z8669 Personal history of other diseases of the nervous system and sense organs: Secondary | ICD-10-CM | POA: Insufficient documentation

## 2013-09-22 DIAGNOSIS — IMO0002 Reserved for concepts with insufficient information to code with codable children: Secondary | ICD-10-CM | POA: Insufficient documentation

## 2013-09-22 DIAGNOSIS — Z8659 Personal history of other mental and behavioral disorders: Secondary | ICD-10-CM | POA: Diagnosis not present

## 2013-09-22 DIAGNOSIS — I10 Essential (primary) hypertension: Secondary | ICD-10-CM | POA: Insufficient documentation

## 2013-09-22 DIAGNOSIS — Z87828 Personal history of other (healed) physical injury and trauma: Secondary | ICD-10-CM | POA: Insufficient documentation

## 2013-09-22 DIAGNOSIS — R112 Nausea with vomiting, unspecified: Secondary | ICD-10-CM | POA: Diagnosis not present

## 2013-09-22 MED ORDER — DIPHENHYDRAMINE HCL 50 MG/ML IJ SOLN
25.0000 mg | Freq: Once | INTRAMUSCULAR | Status: AC
  Administered 2013-09-22: 25 mg via INTRAVENOUS
  Filled 2013-09-22: qty 1

## 2013-09-22 MED ORDER — METOCLOPRAMIDE HCL 5 MG/ML IJ SOLN
10.0000 mg | Freq: Once | INTRAMUSCULAR | Status: AC
  Administered 2013-09-22: 10 mg via INTRAVENOUS
  Filled 2013-09-22: qty 2

## 2013-09-22 MED ORDER — KETOROLAC TROMETHAMINE 30 MG/ML IJ SOLN
30.0000 mg | Freq: Once | INTRAMUSCULAR | Status: AC
  Administered 2013-09-22: 30 mg via INTRAVENOUS
  Filled 2013-09-22: qty 1

## 2013-09-22 MED ORDER — SODIUM CHLORIDE 0.9 % IV BOLUS (SEPSIS)
1000.0000 mL | Freq: Once | INTRAVENOUS | Status: AC
  Administered 2013-09-22: 1000 mL via INTRAVENOUS

## 2013-09-22 NOTE — ED Notes (Signed)
Pt given a gingerale with ice per Dahlia ClientHannah, RN

## 2013-09-22 NOTE — ED Notes (Signed)
PA at the bedside.

## 2013-09-22 NOTE — ED Notes (Signed)
Pt d/c'd from IV, monitor, continuous pulse oximetry and blood pressure cuff; pt getting dressed and being discharged home

## 2013-09-22 NOTE — ED Notes (Signed)
Pt undressed, in gown, on monitor, continuous pulse oximetry and blood pressure cuff 

## 2013-09-22 NOTE — Discharge Instructions (Signed)
Follow up with your doctor for further evaluation. Return to the ED with worsening or concerning symptoms.  °

## 2013-09-22 NOTE — ED Notes (Signed)
Per EMS, Patient started to have pain in his head this morning at around 0500 with no relief from Tramadol. Patient complained of nausea and vomiting. Patient has a history of Migraines and states the symptoms are the same that they have been before now. Patient's stroke screen was negative. Patient denies any HTN History. Vitals per EMS: 132/88, 56 HR, 18 RR, 98 % on RA. 115 CBG.

## 2013-09-22 NOTE — ED Notes (Signed)
Spoke with PA about patient's plan. Patient planned to be sent home. Patient given Malawiturkey sandwich and made aware.

## 2013-09-22 NOTE — ED Provider Notes (Signed)
CSN: [id number]     Arrival date & time 09/22/13  1128 History   First MD Initiated Contact with Patient 09/22/13 1154     Chief Complaint  Patient presents with  . Migraine     (Consider location/radiation/quality/duration/timing/severity/associated sxs/prior Treatment) HPI Comments: Patient is a 43 year old male with a past medical history of migraines, mental retardation, and hypertension who presents with a headache for the past 5 hours. Patient reports a gradual onset and progressive worsening of the headache. The pain is sharp, constant and is located in generalized head without radiation. Patient has tried tramadol for symptoms without relief. No alleviating/aggravating factors. Patient reports associated nausea, vomiting and photophobia. Patient denies fever, diarrhea, numbness/tingling, weakness, visual changes, congestion, chest pain, SOB, abdominal pain.     Patient is a 43 y.o. male presenting with migraines.  Migraine Associated symptoms include headaches, nausea and vomiting. Pertinent negatives include no abdominal pain, arthralgias, chest pain, chills, fatigue, fever, neck pain or weakness.    Past Medical History  Diagnosis Date  . H. pylori infection 10/2001  . Carpal tunnel syndrome of left wrist   . Muscle strain     Multiple  . Mental retardation   . Hypertension   . Chronic headache   . Obstructive sleep apnea    Past Surgical History  Procedure Laterality Date  . Cervical disc surgery  2003  . Esophagogastroduodenoscopy  2003    No ulcers  . Appendectomy  2002  . Lumbar laminectomy  12/2002    L3-4, L4-5  . Carpal tunnel release     Family History  Problem Relation Age of Onset  . Migraines Mother   . Heart disease Paternal Grandfather   . Hypertension Paternal Grandmother   . Stroke Paternal Grandfather   . Lung cancer Paternal Grandmother    History  Substance Use Topics  . Smoking status: Never Smoker   . Smokeless tobacco: Never Used  .  Alcohol Use: No    Review of Systems  Constitutional: Negative for fever, chills and fatigue.  HENT: Negative for trouble swallowing.   Eyes: Negative for visual disturbance.  Respiratory: Negative for shortness of breath.   Cardiovascular: Negative for chest pain and palpitations.  Gastrointestinal: Positive for nausea and vomiting. Negative for abdominal pain and diarrhea.  Genitourinary: Negative for dysuria and difficulty urinating.  Musculoskeletal: Negative for arthralgias and neck pain.  Skin: Negative for color change.  Neurological: Positive for headaches. Negative for dizziness and weakness.  Psychiatric/Behavioral: Negative for dysphoric mood.      Allergies  Review of patient's allergies indicates no known allergies.  Home Medications   Prior to Admission medications   Medication Sig Start Date End Date Taking? Authorizing Provider  albuterol (PROVENTIL HFA;VENTOLIN HFA) 108 (90 BASE) MCG/ACT inhaler Inhale 2 puffs into the lungs every 2 (two) hours as needed for wheezing or shortness of breath (cough). 12/29/12  Yes Mora BellmanHannah S Merrell, PA-C  ARIPiprazole (ABILIFY) 10 MG tablet Take 10 mg by mouth daily. 02/24/11  Yes Reginold Agentachel L Spiegel, MD  cyclobenzaprine (FLEXERIL) 10 MG tablet Take 1 tablet (10 mg total) by mouth 2 (two) times daily as needed for muscle spasms. 03/30/13  Yes Amber Nydia BoutonM Hairford, MD  FLUoxetine (PROZAC) 40 MG capsule Take 40 mg by mouth daily.    Yes Monica Bectonhomas J Thekkekandam, MD  gabapentin (NEURONTIN) 600 MG tablet Take 2 tablets (1,200 mg total) by mouth 3 (three) times daily. 05/19/12 06/10/21 Yes Amber Nydia BoutonM Hairford, MD  HYDROcodone-acetaminophen (NORCO/VICODIN)  5-325 MG per tablet Take 1-2 tablets by mouth every 4 (four) hours as needed for moderate pain.   Yes Historical Provider, MD  lisinopril-hydrochlorothiazide (PRINZIDE,ZESTORETIC) 20-25 MG per tablet Take 1 tablet by mouth daily. 05/19/12  Yes Amber Nydia Bouton, MD  meloxicam (MOBIC) 15 MG tablet Take 1 tablet  (15 mg total) by mouth daily. 03/30/13  Yes Amber Nydia Bouton, MD  methocarbamol (ROBAXIN) 500 MG tablet Take 1 tablet (500 mg total) by mouth 2 (two) times daily as needed for muscle spasms. 04/07/13  Yes Grazia Taffe, PA-C  omeprazole (PRILOSEC) 40 MG capsule Take 1 capsule (40 mg total) by mouth daily. 03/30/13  Yes Amber Nydia Bouton, MD  predniSONE (DELTASONE) 20 MG tablet Take 2 tablets (40 mg total) by mouth daily. 04/07/13  Yes Deondria Puryear, PA-C  traMADol (ULTRAM) 50 MG tablet Take 1 tablet (50 mg total) by mouth every 8 (eight) hours as needed. For pain 11/22/12  Yes Amber Nydia Bouton, MD  traZODone (DESYREL) 100 MG tablet Take 200 mg by mouth at bedtime.    Yes Historical Provider, MD   BP 138/71  Pulse 59  Temp(Src) 97.8 F (36.6 C) (Oral)  Resp 15  SpO2 96% Physical Exam  Nursing note and vitals reviewed. Constitutional: He is oriented to person, place, and time. He appears well-developed and well-nourished. No distress.  HENT:  Head: Normocephalic and atraumatic.  Mouth/Throat: Oropharynx is clear and moist. No oropharyngeal exudate.  Eyes: Conjunctivae and EOM are normal. Pupils are equal, round, and reactive to light.  Neck: Normal range of motion.  Cardiovascular: Normal rate and regular rhythm.  Exam reveals no gallop and no friction rub.   No murmur heard. Pulmonary/Chest: Effort normal and breath sounds normal. He has no wheezes. He has no rales. He exhibits no tenderness.  Abdominal: Soft. He exhibits no distension. There is no tenderness. There is no rebound and no guarding.  Musculoskeletal: Normal range of motion.  Neurological: He is alert and oriented to person, place, and time. No cranial nerve deficit. Coordination normal.  Extremity strength and sensation equal and intact bilaterally. Speech is goal-oriented. Moves limbs without ataxia.   Skin: Skin is warm and dry.  Psychiatric: He has a normal mood and affect. His behavior is normal.    ED Course   Procedures (including critical care time) Labs Review Labs Reviewed - No data to display  Imaging Review No results found.   EKG Interpretation None      MDM   Final diagnoses:  Other type of nonintractable migraine    12:36 PM Vitals stable and patient afebrile. Patient will have fluids, toradol, reglan, benadryl. No neuro deficits.    2:36 PM Patient is feeling better and would like to go home. No further evaluation needed at this time. Patient instructed to return with worsening or concerning symptoms. Patient will follow up with PCP.     Emilia Beck, PA-C 09/22/13 1437

## 2013-09-23 NOTE — ED Provider Notes (Signed)
Medical screening examination/treatment/procedure(s) were performed by non-physician practitioner and as supervising physician I was immediately available for consultation/collaboration.   EKG Interpretation None        Deondrea H Aubreyanna Dorrough, MD 09/23/13 1517 

## 2013-09-27 ENCOUNTER — Ambulatory Visit (INDEPENDENT_AMBULATORY_CARE_PROVIDER_SITE_OTHER): Payer: Medicare HMO | Admitting: Psychiatry

## 2013-09-27 DIAGNOSIS — F209 Schizophrenia, unspecified: Secondary | ICD-10-CM

## 2013-09-29 ENCOUNTER — Ambulatory Visit: Payer: Medicare HMO | Admitting: Psychiatry

## 2013-10-06 ENCOUNTER — Ambulatory Visit: Payer: Medicare HMO | Admitting: Psychiatry

## 2013-10-13 ENCOUNTER — Ambulatory Visit (INDEPENDENT_AMBULATORY_CARE_PROVIDER_SITE_OTHER): Payer: Medicare HMO | Admitting: Psychiatry

## 2013-10-13 DIAGNOSIS — F209 Schizophrenia, unspecified: Secondary | ICD-10-CM

## 2013-10-20 ENCOUNTER — Ambulatory Visit (INDEPENDENT_AMBULATORY_CARE_PROVIDER_SITE_OTHER): Payer: Medicare HMO | Admitting: Psychiatry

## 2013-10-20 DIAGNOSIS — F209 Schizophrenia, unspecified: Secondary | ICD-10-CM

## 2013-10-27 ENCOUNTER — Ambulatory Visit (INDEPENDENT_AMBULATORY_CARE_PROVIDER_SITE_OTHER): Payer: Medicare HMO | Admitting: Internal Medicine

## 2013-10-27 ENCOUNTER — Ambulatory Visit (INDEPENDENT_AMBULATORY_CARE_PROVIDER_SITE_OTHER): Payer: Medicare HMO | Admitting: Psychiatry

## 2013-10-27 ENCOUNTER — Other Ambulatory Visit (INDEPENDENT_AMBULATORY_CARE_PROVIDER_SITE_OTHER): Payer: Medicare HMO

## 2013-10-27 ENCOUNTER — Encounter: Payer: Self-pay | Admitting: Internal Medicine

## 2013-10-27 DIAGNOSIS — G44229 Chronic tension-type headache, not intractable: Secondary | ICD-10-CM

## 2013-10-27 DIAGNOSIS — I10 Essential (primary) hypertension: Secondary | ICD-10-CM

## 2013-10-27 DIAGNOSIS — F209 Schizophrenia, unspecified: Secondary | ICD-10-CM

## 2013-10-27 DIAGNOSIS — F341 Dysthymic disorder: Secondary | ICD-10-CM

## 2013-10-27 DIAGNOSIS — G44221 Chronic tension-type headache, intractable: Secondary | ICD-10-CM

## 2013-10-27 DIAGNOSIS — E785 Hyperlipidemia, unspecified: Secondary | ICD-10-CM

## 2013-10-27 DIAGNOSIS — Z5181 Encounter for therapeutic drug level monitoring: Secondary | ICD-10-CM

## 2013-10-27 LAB — COMPREHENSIVE METABOLIC PANEL
ALK PHOS: 70 U/L (ref 39–117)
ALT: 61 U/L — AB (ref 0–53)
AST: 33 U/L (ref 0–37)
Albumin: 4.1 g/dL (ref 3.5–5.2)
BILIRUBIN TOTAL: 0.8 mg/dL (ref 0.2–1.2)
BUN: 9 mg/dL (ref 6–23)
CO2: 30 meq/L (ref 19–32)
CREATININE: 1 mg/dL (ref 0.4–1.5)
Calcium: 9.3 mg/dL (ref 8.4–10.5)
Chloride: 98 mEq/L (ref 96–112)
GFR: 84.58 mL/min (ref >60.00)
Glucose, Bld: 83 mg/dL (ref 70–99)
Potassium: 4 mEq/L (ref 3.5–5.1)
SODIUM: 134 meq/L — AB (ref 135–145)
TOTAL PROTEIN: 8 g/dL (ref 6.0–8.3)

## 2013-10-27 LAB — CBC
HCT: 49.7 % (ref 39.0–52.0)
HEMOGLOBIN: 16.7 g/dL (ref 13.0–17.0)
MCHC: 33.7 g/dL (ref 30.0–36.0)
MCV: 92.5 fl (ref 78.0–100.0)
PLATELETS: 275 10*3/uL (ref 150.0–400.0)
RBC: 5.37 Mil/uL (ref 4.22–5.81)
RDW: 14.3 % (ref 11.5–15.5)
WBC: 13 10*3/uL — ABNORMAL HIGH (ref 4.0–10.5)

## 2013-10-27 LAB — LIPID PANEL
CHOL/HDL RATIO: 6
Cholesterol: 174 mg/dL (ref 0–200)
HDL: 29.3 mg/dL — AB (ref >39.00)
LDL Cholesterol: 107 mg/dL — ABNORMAL HIGH (ref 0–99)
NONHDL: 144.7
Triglycerides: 187 mg/dL — ABNORMAL HIGH (ref 0.0–149.0)
VLDL: 37.4 mg/dL (ref 0.0–40.0)

## 2013-10-27 NOTE — Progress Notes (Signed)
   Subjective:    Patient ID: Lance Luna, male    DOB: [birth year], 43 y.o.   MRN: [id number]  HPI The patient is a 43 YO man who is coming in today to establish care. He has PMH of some mild cognitive impairment, depression, chronic lumbar back pain, morbid obesity, HTN, OSA. He has CPAP at home which he tries to use. He has not been exercising recently and knows that maybe he should to try to lose some weight. He still struggles with back pain but would like to get off some of his medicines as it is confusing to him how to keep them all organized. He brought all of his medicines in with him and he has several old bottles and medications that he is not taking. He denies chest pain with walking and is able to climb an entire flight of stairs without SOB. He has an albuterol inhaler at home which he does not use. He denies SOB or wheezing. He denies abdominal pain or diarrhea or constipation. 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 He feels his mood is good right now and he follows with his mental health provider every 6 months.   Review of Systems  Constitutional: Negative for activity change, appetite change, fatigue and unexpected weight change.  HENT: Negative for congestion.   Respiratory: Negative for cough, chest tightness, shortness of breath and wheezing.   Cardiovascular: Negative for chest pain, palpitations and leg swelling.  Gastrointestinal: Negative for abdominal pain, diarrhea, constipation and abdominal distention.  Musculoskeletal: Positive for back pain. Negative for gait problem and joint swelling.  Skin: Negative for wound.  Neurological: Positive for headaches. Negative for dizziness, weakness, light-headedness and numbness.       Objective:   Physical Exam  Vitals reviewed. Constitutional: He is oriented to person, place, and time. He appears well-developed and  well-nourished.  Morbidly obese  HENT:  Head: Normocephalic and atraumatic.  Eyes: EOM are normal.  Cardiovascular: Normal rate and regular rhythm.   No murmur heard. Pulmonary/Chest: Effort normal and breath sounds normal. No respiratory distress. He has no wheezes. He has no rales.  Decreased movement due to adiposity.   Abdominal: Soft. Bowel sounds are normal. He exhibits no distension. There is no tenderness. There is no rebound.  Musculoskeletal: He exhibits tenderness.  Lumbar region and in the paraspinal region of the cervical spine.   Neurological: He is alert and oriented to person, place, and time. Coordination normal.  Skin: Skin is warm and dry.   Filed Vitals:   10/27/13 0854  BP: 112/70  Pulse: 69  Temp: 98.7 F (37.1 C)  TempSrc: Oral  Resp: 20  Height: 6' (1.829 m)  Weight: 331 lb 12.8 oz (150.503 kg)  SpO2: 95%      Assessment & Plan:

## 2013-10-27 NOTE — Patient Instructions (Addendum)
We have stopped some of your medicines and we do not want you to start taking them again. I have taken the bottles and you should not have any refills at your pharmacy.  We will check your blood work today and let you know about the results.    We want you to try to walk for 30 minutes 5 times per week.  Exercise to Lose Weight Exercise and a healthy diet may help you lose weight. Your doctor may suggest specific exercises. EXERCISE IDEAS AND TIPS  Choose low-cost things you enjoy doing, such as walking, bicycling, or exercising to workout videos.  Take stairs instead of the elevator.  Walk during your lunch break.  Park your car further away from work or school.  Go to a gym or an exercise class.  Start with 5 to 10 minutes of exercise each day. Build up to 30 minutes of exercise 4 to 6 days a week.  Wear shoes with good support and comfortable clothes.  Stretch before and after working out.  Work out until you breathe harder and your heart beats faster.  Drink extra water when you exercise.  Do not do so much that you hurt yourself, feel dizzy, or get very short of breath. Exercises that burn about 150 calories:  Running 1  miles in 15 minutes.  Playing volleyball for 45 to 60 minutes.  Washing and waxing a car for 45 to 60 minutes.  Playing touch football for 45 minutes.  Walking 1  miles in 35 minutes.  Pushing a stroller 1  miles in 30 minutes.  Playing basketball for 30 minutes.  Raking leaves for 30 minutes.  Bicycling 5 miles in 30 minutes.  Walking 2 miles in 30 minutes.  Dancing for 30 minutes.  Shoveling snow for 15 minutes.  Swimming laps for 20 minutes.  Walking up stairs for 15 minutes.  Bicycling 4 miles in 15 minutes.  Gardening for 30 to 45 minutes.  Jumping rope for 15 minutes.  Washing windows or floors for 45 to 60 minutes. Document Released: 03/08/2010 Document Revised: 04/28/2011 Document Reviewed: 03/08/2010 [facility]  Patient Information 2015 [place], [place]. This information is not intended to replace advice given to you by your health care provider. Make sure you discuss any questions you have with your health care provider.

## 2013-10-27 NOTE — Assessment & Plan Note (Signed)
BP well controlled on lisinopril/HCTZ 20/25. Will check BMP today. Filed Vitals:   10/27/13 0854  BP: 112/70  Pulse: 69  Temp: 98.7 F (37.1 C)  TempSrc: Oral  Resp: 20  Height: 6' (1.829 m)  Weight: 331 lb 12.8 oz (150.503 kg)  SpO2: 95%

## 2013-10-27 NOTE — Assessment & Plan Note (Signed)
Patient did have a lot of bottles with him that were from some time ago. Confiscated a bottle of trazodone from 2011, voltaren, robaxin, hydrocodone, ranitidine, meclizine. Will D/C these medications in the computer and further adjust regimen as needed.

## 2013-10-27 NOTE — Assessment & Plan Note (Signed)
Sees Dr. Noe Gens at [place] [facility] and therapists. Will not adjust medication.

## 2013-10-27 NOTE — Progress Notes (Signed)
Pre visit review using our clinic review tool, if applicable. No additional management support is needed unless otherwise documented below in the visit note. 

## 2013-10-27 NOTE — Assessment & Plan Note (Signed)
Spoke with patient about need to lose weight. He will work on walking for 30 minutes a day until our next visit.

## 2013-10-27 NOTE — Assessment & Plan Note (Signed)
Appears to be related to muscular spasm in the neck and may benefit from botox injection. Would like to avoid narcotics and trial other topical options for now.

## 2013-10-27 NOTE — Assessment & Plan Note (Signed)
Check lipid panel today and encouraged weight loss.

## 2013-11-03 ENCOUNTER — Ambulatory Visit (INDEPENDENT_AMBULATORY_CARE_PROVIDER_SITE_OTHER): Payer: Medicare HMO | Admitting: Psychiatry

## 2013-11-03 DIAGNOSIS — F209 Schizophrenia, unspecified: Secondary | ICD-10-CM

## 2013-11-10 ENCOUNTER — Ambulatory Visit (INDEPENDENT_AMBULATORY_CARE_PROVIDER_SITE_OTHER): Payer: Medicare HMO | Admitting: Psychiatry

## 2013-11-10 DIAGNOSIS — F209 Schizophrenia, unspecified: Secondary | ICD-10-CM

## 2013-11-17 ENCOUNTER — Ambulatory Visit (INDEPENDENT_AMBULATORY_CARE_PROVIDER_SITE_OTHER): Payer: Medicare HMO | Admitting: Psychiatry

## 2013-11-17 DIAGNOSIS — F209 Schizophrenia, unspecified: Secondary | ICD-10-CM

## 2013-11-24 ENCOUNTER — Ambulatory Visit (INDEPENDENT_AMBULATORY_CARE_PROVIDER_SITE_OTHER): Payer: Medicare HMO | Admitting: Psychiatry

## 2013-11-24 DIAGNOSIS — F209 Schizophrenia, unspecified: Secondary | ICD-10-CM

## 2013-12-01 ENCOUNTER — Ambulatory Visit (INDEPENDENT_AMBULATORY_CARE_PROVIDER_SITE_OTHER): Payer: Medicare HMO | Admitting: Psychiatry

## 2013-12-01 DIAGNOSIS — F209 Schizophrenia, unspecified: Secondary | ICD-10-CM

## 2013-12-08 ENCOUNTER — Ambulatory Visit: Payer: Medicare HMO | Admitting: Psychiatry

## 2013-12-15 ENCOUNTER — Ambulatory Visit (INDEPENDENT_AMBULATORY_CARE_PROVIDER_SITE_OTHER): Payer: Medicare HMO | Admitting: Psychiatry

## 2013-12-15 DIAGNOSIS — F209 Schizophrenia, unspecified: Secondary | ICD-10-CM

## 2013-12-22 ENCOUNTER — Ambulatory Visit: Payer: Medicare HMO | Admitting: Psychiatry

## 2013-12-29 ENCOUNTER — Ambulatory Visit (INDEPENDENT_AMBULATORY_CARE_PROVIDER_SITE_OTHER): Payer: Medicare HMO | Admitting: Psychiatry

## 2013-12-29 ENCOUNTER — Ambulatory Visit: Payer: Self-pay | Admitting: Internal Medicine

## 2013-12-29 ENCOUNTER — Ambulatory Visit (INDEPENDENT_AMBULATORY_CARE_PROVIDER_SITE_OTHER): Payer: Medicare HMO

## 2013-12-29 ENCOUNTER — Ambulatory Visit (INDEPENDENT_AMBULATORY_CARE_PROVIDER_SITE_OTHER): Payer: Medicare HMO | Admitting: Internal Medicine

## 2013-12-29 ENCOUNTER — Encounter: Payer: Self-pay | Admitting: Internal Medicine

## 2013-12-29 VITALS — BP 110/74 | HR 72 | Temp 98.1°F | Resp 14 | Wt 332.5 lb

## 2013-12-29 DIAGNOSIS — Z23 Encounter for immunization: Secondary | ICD-10-CM

## 2013-12-29 DIAGNOSIS — J209 Acute bronchitis, unspecified: Secondary | ICD-10-CM

## 2013-12-29 DIAGNOSIS — F209 Schizophrenia, unspecified: Secondary | ICD-10-CM

## 2013-12-29 MED ORDER — HYDROCODONE-HOMATROPINE 5-1.5 MG/5ML PO SYRP: 5.0000 mL | ORAL_SOLUTION | Freq: Four times a day (QID) | ORAL | Status: DC | PRN

## 2013-12-29 MED ORDER — AZITHROMYCIN 250 MG PO TABS: ORAL_TABLET | ORAL | Status: DC

## 2013-12-29 NOTE — Progress Notes (Signed)
   Subjective:    Patient ID: Lance Luna, male    DOB: 10/06/1970, 43 y.o.   MRN: [id number]  HPI   His symptoms began 12/27/13 as a cough productive of white sputum.  He also had rhinitis with clear drainage  Sneezing was present but resolved with Claritin.  He's had some slight shortness of breath  He does have sleep apnea but has been compliant with his CPAP  He denies other upper respiratory tract infection symptoms or reflux symptoms. He is on an ACE inhibitor.  He is a nonsmoker.    Review of Systems Frontal headache, facial pain , nasal purulence,  sore throat , otic pain or otic discharge denied. No fever , chills or sweats.  Abdominal pain, significant dyspepsia, or dysphagia are denied.     Objective:   Physical Exam   Pertinent positive findings include:  As per CDC Guidelines ,Epic documents severe obesity as being present . 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 Wax obscures the left tympanic membrane Breath sounds are decreased.  General appearance:well nourished; no acute distress or increased work of breathing is present.  No  lymphadenopathy about the head, neck, or axilla noted.  Eyes: No conjunctival inflammation or lid edema is present. There is no scleral icterus. Ears:  External ear exam shows no significant lesions or deformities.   Nose:  External nasal examination shows no deformity or inflammation. Nasal mucosa are dry without lesions or exudates. No septal dislocation or deviation.No obstruction to airflow.  Oral exam: Dental hygiene is good; lips and gums are healthy appearing.There is no oropharyngeal erythema or exudate noted.  Neck:  No deformities, thyromegaly, masses, or tenderness noted.   Supple with full range of motion without pain.  Heart:  Normal rate and regular rhythm. S1 and S2 normal without gallop, murmur, click, rub or other extra sounds.  Lungs:Chest clear to auscultation; no wheezes,  rhonchi,rales ,or rubs present.No increased work of breathing.   Extremities:  No cyanosis, edema, or clubbing  noted  Skin: Warm & dry w/o jaundice or tenting.           Assessment & Plan:  #1 acute bronchitis w/o bronchospasm #2  Rhinitis w/o associated URI Plan: See orders and recommendations

## 2013-12-29 NOTE — Progress Notes (Signed)
Pre visit review using our clinic review tool, if applicable. No additional management support is needed unless otherwise documented below in the visit note. 

## 2013-12-29 NOTE — Patient Instructions (Signed)
Plain Mucinex (NOT D) for thick secretions ;force NON dairy fluids .   Flonase OR Nasacort AQ 1 spray in each nostril twice a day as needed for head congestion. Use the "crossover" technique into opposite nostril spraying toward opposite ear @ 45 degree angle, not straight up into nostril.  Plain Allegra (NOT D )  160 daily , Loratidine 10 mg , OR Zyrtec 10 mg @ bedtime  as needed for itchy eyes & sneezing.

## 2014-01-05 ENCOUNTER — Ambulatory Visit (INDEPENDENT_AMBULATORY_CARE_PROVIDER_SITE_OTHER): Payer: Medicare HMO | Admitting: Psychiatry

## 2014-01-05 DIAGNOSIS — F209 Schizophrenia, unspecified: Secondary | ICD-10-CM

## 2014-01-19 ENCOUNTER — Ambulatory Visit (INDEPENDENT_AMBULATORY_CARE_PROVIDER_SITE_OTHER): Payer: Medicare HMO | Admitting: Psychiatry

## 2014-01-19 DIAGNOSIS — F209 Schizophrenia, unspecified: Secondary | ICD-10-CM

## 2014-02-02 ENCOUNTER — Ambulatory Visit (INDEPENDENT_AMBULATORY_CARE_PROVIDER_SITE_OTHER): Payer: Medicare HMO | Admitting: Psychiatry

## 2014-02-02 DIAGNOSIS — F209 Schizophrenia, unspecified: Secondary | ICD-10-CM

## 2014-02-06 ENCOUNTER — Telehealth: Payer: Self-pay | Admitting: Internal Medicine

## 2014-02-06 NOTE — Telephone Encounter (Signed)
Pt calling states he needs refill of BP meds and Tramadol, [facility] [address] told him to call PCP for refills.

## 2014-02-07 MED ORDER — TRAMADOL HCL 50 MG PO TABS: 50.0000 mg | ORAL_TABLET | Freq: Three times a day (TID) | ORAL | Status: DC | PRN

## 2014-02-07 NOTE — Telephone Encounter (Signed)
Please advise on refill of Tramadol.  Patient has an appointment in March for a 6 month follow up and this medication was last filled last October with 2 refills.

## 2014-02-07 NOTE — Telephone Encounter (Signed)
Called patient and informed them RX of Tramadol has been faxed to pharmacy.

## 2014-02-07 NOTE — Telephone Encounter (Signed)
Ok to refill 30 tabs, no refill (per protocol covering for absent PCP) - prescription printed and signed - placed on CMA desk

## 2014-02-07 NOTE — Telephone Encounter (Signed)
Faxed to [facility] on [address].

## 2014-02-23 ENCOUNTER — Ambulatory Visit (INDEPENDENT_AMBULATORY_CARE_PROVIDER_SITE_OTHER): Payer: PPO | Admitting: Psychiatry

## 2014-02-23 DIAGNOSIS — F209 Schizophrenia, unspecified: Secondary | ICD-10-CM

## 2014-03-01 ENCOUNTER — Ambulatory Visit (INDEPENDENT_AMBULATORY_CARE_PROVIDER_SITE_OTHER): Payer: PPO | Admitting: Internal Medicine

## 2014-03-01 ENCOUNTER — Encounter: Payer: Self-pay | Admitting: Internal Medicine

## 2014-03-01 VITALS — BP 110/78 | HR 70 | Temp 97.8°F | Resp 20 | Ht 72.0 in | Wt 332.0 lb

## 2014-03-01 DIAGNOSIS — K529 Noninfective gastroenteritis and colitis, unspecified: Secondary | ICD-10-CM

## 2014-03-01 NOTE — Progress Notes (Signed)
Pre visit review using our clinic review tool, if applicable. No additional management support is needed unless otherwise documented below in the visit note. 

## 2014-03-01 NOTE — Progress Notes (Signed)
   Subjective:    Patient ID: Lance Luna, male    DOB: 01/22/1971, 44 y.o.   MRN: [id number]  HPI The patient is a 44 YO male who is coming in today with urgency of bowel movements. He is going now about 5 times per day and has been for several months. He has not mentioned this at other visits and his mental health provider advised him to talk to us about it. His stools do not have any blood and are normal in caliber and consistency. He denies any stomach pains or bloating associated with them. He denies fevers or weight loss. He is trying to exercise but it is hard to keep doing. He denies other new complaints.   Review of Systems  Constitutional: Negative for fever, chills, activity change, appetite change, fatigue and unexpected weight change.  HENT: Negative.   Respiratory: Negative for cough, chest tightness, shortness of breath and wheezing.   Cardiovascular: Negative for chest pain, palpitations and leg swelling.  Gastrointestinal: Positive for diarrhea. Negative for nausea, abdominal pain, constipation, blood in stool and abdominal distention.       Increased frequency.   Musculoskeletal: Negative.   Skin: Negative.   Neurological: Negative for dizziness, weakness, light-headedness and headaches.      Objective:   Physical Exam  Constitutional: He is oriented to person, place, and time. He appears well-developed and well-nourished.  Morbidly obese  HENT:  Head: Normocephalic and atraumatic.  Eyes: EOM are normal.  Cardiovascular: Normal rate and regular rhythm.   No murmur heard. Pulmonary/Chest: Effort normal and breath sounds normal. No respiratory distress. He has no wheezes. He has no rales.  Abdominal: Soft. Bowel sounds are normal. He exhibits no distension. There is no tenderness. There is no rebound.  Neurological: He is alert and oriented to person, place, and time. Coordination normal.  Skin: Skin is warm and dry.  Vitals reviewed.  Filed Vitals:   03/01/14 0805   BP: 110/78  Pulse: 70  Temp: 97.8 F (36.6 C)  TempSrc: Oral  Resp: 20  Height: 6' (1.829 m)  Weight: 332 lb (150.594 kg)  SpO2: 97%      Assessment & Plan:

## 2014-03-01 NOTE — Patient Instructions (Signed)
We will have you stop taking omeprazole as it can cause diarrhea.   If this does not help with the diarrhea you can get some imodium over the counter and take 1 pill when you start to have diarrhea. Do not take more than 2 per day as it can make you constipated. If you get constipated and cannot move your poop please stop taking the imodium.

## 2014-03-01 NOTE — Assessment & Plan Note (Signed)
Not true diarrhea. Could be from omeprazole and will have him stop this. If no improvement he can try imodium to slow down his stools. Will look through medications to ensure not missing another medication that could be contributing. Has not had colonoscopy and if no improvement may have him see GI to see if they want him to do early colonoscopy.

## 2014-03-02 ENCOUNTER — Ambulatory Visit (INDEPENDENT_AMBULATORY_CARE_PROVIDER_SITE_OTHER): Payer: PPO | Admitting: Psychiatry

## 2014-03-02 DIAGNOSIS — F209 Schizophrenia, unspecified: Secondary | ICD-10-CM

## 2014-03-03 ENCOUNTER — Encounter (HOSPITAL_COMMUNITY): Payer: Self-pay

## 2014-03-03 ENCOUNTER — Emergency Department (HOSPITAL_COMMUNITY)
Admission: EM | Admit: 2014-03-03 | Discharge: 2014-03-03 | Disposition: A | Discharge status: Home / Self Care | Payer: PPO | Attending: Emergency Medicine | Admitting: Emergency Medicine

## 2014-03-03 DIAGNOSIS — Z79899 Other long term (current) drug therapy: Secondary | ICD-10-CM | POA: Diagnosis not present

## 2014-03-03 DIAGNOSIS — Z8669 Personal history of other diseases of the nervous system and sense organs: Secondary | ICD-10-CM | POA: Insufficient documentation

## 2014-03-03 DIAGNOSIS — R51 Headache: Secondary | ICD-10-CM | POA: Insufficient documentation

## 2014-03-03 DIAGNOSIS — I10 Essential (primary) hypertension: Secondary | ICD-10-CM | POA: Insufficient documentation

## 2014-03-03 DIAGNOSIS — Z87828 Personal history of other (healed) physical injury and trauma: Secondary | ICD-10-CM | POA: Insufficient documentation

## 2014-03-03 DIAGNOSIS — R519 Headache, unspecified: Secondary | ICD-10-CM

## 2014-03-03 DIAGNOSIS — G8929 Other chronic pain: Secondary | ICD-10-CM | POA: Insufficient documentation

## 2014-03-03 DIAGNOSIS — Z8619 Personal history of other infectious and parasitic diseases: Secondary | ICD-10-CM | POA: Diagnosis not present

## 2014-03-03 DIAGNOSIS — Z791 Long term (current) use of non-steroidal anti-inflammatories (NSAID): Secondary | ICD-10-CM | POA: Diagnosis not present

## 2014-03-03 DIAGNOSIS — E669 Obesity, unspecified: Secondary | ICD-10-CM | POA: Insufficient documentation

## 2014-03-03 MED ORDER — KETOROLAC TROMETHAMINE 60 MG/2ML IM SOLN
60.0000 mg | Freq: Once | INTRAMUSCULAR | Status: AC
  Administered 2014-03-03: 60 mg via INTRAMUSCULAR
  Filled 2014-03-03: qty 2

## 2014-03-03 MED ORDER — DIPHENHYDRAMINE HCL 50 MG/ML IJ SOLN
25.0000 mg | Freq: Once | INTRAMUSCULAR | Status: AC
  Administered 2014-03-03: 25 mg via INTRAMUSCULAR
  Filled 2014-03-03: qty 1

## 2014-03-03 MED ORDER — METOCLOPRAMIDE HCL 5 MG/ML IJ SOLN
10.0000 mg | Freq: Once | INTRAMUSCULAR | Status: AC
  Administered 2014-03-03: 10 mg via INTRAMUSCULAR
  Filled 2014-03-03: qty 2

## 2014-03-03 NOTE — ED Provider Notes (Signed)
CSN: [id number]     Arrival date & time 03/03/14  0849 History   First MD Initiated Contact with Patient 03/03/14 [phone]     Chief Complaint  Patient presents with  . Headache     (Consider location/radiation/quality/duration/timing/severity/associated sxs/prior Treatment) HPI Lance Luna is a 44 y.o. male with a history of chronic headache who comes in for evaluation of acute headache. Patient states this morning at 2 AM he had a left-sided headache that originates in his left neck and wraps around to the front of his head. He reports this headache is, and for him. He characterizes it as a tightness. He has tried tramadol and Flexeril with minimal relief. He reports a Vicodin would help it, but he is out of this medication and is unable to see his primary care until next week. He denies new heaters in the home, changes in vision, new numbness or weakness, fevers, chest pain, short of breath, abdominal pain.  Past Medical History  Diagnosis Date  . H. pylori infection 10/2001  . Carpal tunnel syndrome of left wrist   . Muscle strain     Multiple  . Mental retardation   . Hypertension   . Chronic headache   . Obstructive sleep apnea    Past Surgical History  Procedure Laterality Date  . Cervical disc surgery  2003  . Esophagogastroduodenoscopy  2003    No ulcers  . Appendectomy  2002  . Lumbar laminectomy  12/2002    L3-4, L4-5  . Carpal tunnel release     Family History  Problem Relation Age of Onset  . Migraines Mother   . Heart disease Paternal Grandfather   . Hypertension Paternal Grandmother   . Stroke Paternal Grandfather   . Lung cancer Paternal Grandmother    History  Substance Use Topics  . Smoking status: Never Smoker   . Smokeless tobacco: Never Used  . Alcohol Use: No    Review of Systems  All other systems reviewed and are negative. A 10 point review of systems was completed and was negative except for pertinent positives and negatives as mentioned in the  history of present illness      Allergies  Review of patient's allergies indicates no known allergies.  Home Medications   Prior to Admission medications   Medication Sig Start Date End Date Taking? Authorizing Provider  albuterol (PROVENTIL HFA;VENTOLIN HFA) 108 (90 BASE) MCG/ACT inhaler Inhale 2 puffs into the lungs every 2 (two) hours as needed for wheezing or shortness of breath (cough). 12/29/12  Yes Mora Bellman, PA-C  ARIPiprazole (ABILIFY) 10 MG tablet Take 10 mg by mouth daily. 02/24/11  Yes Reginold Agent, MD  cyclobenzaprine (FLEXERIL) 10 MG tablet Take 1 tablet (10 mg total) by mouth 2 (two) times daily as needed for muscle spasms. 03/30/13  Yes Amber Nydia Bouton, MD  FLUoxetine (PROZAC) 40 MG capsule Take 40 mg by mouth daily.    Yes Monica Becton, MD  gabapentin (NEURONTIN) 600 MG tablet Take 2 tablets (1,200 mg total) by mouth 3 (three) times daily. 05/19/12 06/10/21 Yes Amber Nydia Bouton, MD  lisinopril-hydrochlorothiazide (PRINZIDE,ZESTORETIC) 20-25 MG per tablet Take 1 tablet by mouth daily. 05/19/12  Yes Amber Nydia Bouton, MD  meloxicam (MOBIC) 15 MG tablet Take 1 tablet (15 mg total) by mouth daily. 03/30/13  Yes Amber Nydia Bouton, MD  traMADol (ULTRAM) 50 MG tablet Take 1 tablet (50 mg total) by mouth every 8 (eight) hours as needed. For pain  02/07/14  Yes Newt LukesValerie A Leschber, MD  traZODone (DESYREL) 100 MG tablet Take 200 mg by mouth at bedtime.    Yes Historical Provider, MD  omeprazole (PRILOSEC) 40 MG capsule Take 1 capsule (40 mg total) by mouth daily. Patient not taking: Reported on 03/03/2014 03/30/13   Hilarie FredricksonAmber M Hairford, MD   BP 132/85 mmHg  Pulse 67  Temp(Src) 97.8 F (36.6 C) (Oral)  Resp 20  SpO2 97% Physical Exam  Constitutional: He is oriented to person, place, and time. He appears well-developed and well-nourished.  Obese.  HENT:  Head: Normocephalic and atraumatic.  Mouth/Throat: Oropharynx is clear and moist.  Eyes: Conjunctivae are normal. Pupils  are equal, round, and reactive to light. Right eye exhibits no discharge. Left eye exhibits no discharge. No scleral icterus.  Neck: Normal range of motion. Neck supple.  No meningismus  Cardiovascular: Normal rate, regular rhythm and normal heart sounds.   Pulmonary/Chest: Effort normal and breath sounds normal. No respiratory distress. He has no wheezes. He has no rales.  Abdominal: Soft. There is no tenderness.  Musculoskeletal: He exhibits no tenderness.  Neurological: He is alert and oriented to person, place, and time.  Cranial Nerves II-XII grossly intact. Motor and sensation 5/5 in all 4 extremities. Gait baseline without ataxia  Skin: Skin is warm and dry. No rash noted.  Psychiatric: He has a normal mood and affect.  Nursing note and vitals reviewed.   ED Course  Procedures (including critical care time) Labs Review Labs Reviewed - No data to display  Imaging Review No results found.   EKG Interpretation None     Meds given in ED:  Medications  ketorolac (TORADOL) injection 60 mg (60 mg Intramuscular Given 03/03/14 0950)  diphenhydrAMINE (BENADRYL) injection 25 mg (25 mg Intramuscular Given 03/03/14 0951)  metoCLOPramide (REGLAN) injection 10 mg (10 mg Intramuscular Given 03/03/14 0950)    New Prescriptions   No medications on file   Filed Vitals:   03/03/14 0857  BP: 132/85  Pulse: 67  Temp: 97.8 F (36.6 C)  TempSrc: Oral  Resp: 20  SpO2: 97%    MDM  Vitals stable - WNL -afebrile Pt resting comfortably in ED. HA is improving in ED after administration of medications. Patient states "it's going away". PE--normal neuro exam. Not concerning further acute or emergent pathology  This appears to be an acute exacerbation of a chronic headache that the patient has. Low concern for any other acute or emergent pathology associated with his headache. Patient requests prescription for Vicodin, discussed treating her pain acutely in the ED, chronic pain is managed by  his PCP. Patient verbalizes understanding and will schedule appointment. Patient stable, in good condition and ambulates out of the ED without difficulty  I discussed all relevant lab findings and imaging results with pt and they verbalized understanding. Discussed f/u with PCP within 48 hrs and return precautions, pt very amenable to plan.  Final diagnoses:  Acute nonintractable headache, unspecified headache type       Sharlene MottsBenjamin W Elizabelle Fite, PA-C 03/03/14 1031  Enid SkeensJoshua M Zavitz, MD 03/06/14 1013

## 2014-03-03 NOTE — ED Notes (Addendum)
Pt states headache starting early this morning.  HX of same.  Out of vicodin.  Cannot see MD until next week.  ----Pt seen Wednesday by primary care as noted per chart.  Asked pt about filling script at that time.  Pt states he did not have a headache that day.

## 2014-03-09 ENCOUNTER — Ambulatory Visit: Payer: Medicare HMO | Admitting: Psychiatry

## 2014-03-16 ENCOUNTER — Ambulatory Visit (INDEPENDENT_AMBULATORY_CARE_PROVIDER_SITE_OTHER): Payer: PPO | Admitting: Psychiatry

## 2014-03-16 DIAGNOSIS — F209 Schizophrenia, unspecified: Secondary | ICD-10-CM

## 2014-03-30 ENCOUNTER — Ambulatory Visit: Payer: Self-pay | Admitting: Psychiatry

## 2014-03-30 ENCOUNTER — Ambulatory Visit: Payer: PPO | Admitting: Psychiatry

## 2014-04-06 ENCOUNTER — Ambulatory Visit (INDEPENDENT_AMBULATORY_CARE_PROVIDER_SITE_OTHER): Payer: PPO | Admitting: Psychiatry

## 2014-04-06 DIAGNOSIS — F209 Schizophrenia, unspecified: Secondary | ICD-10-CM

## 2014-04-13 ENCOUNTER — Ambulatory Visit: Payer: PPO | Admitting: Psychiatry

## 2014-04-17 ENCOUNTER — Encounter (HOSPITAL_COMMUNITY): Payer: Self-pay | Admitting: Emergency Medicine

## 2014-04-17 ENCOUNTER — Emergency Department (HOSPITAL_COMMUNITY)
Admission: EM | Admit: 2014-04-17 | Discharge: 2014-04-17 | Disposition: A | Discharge status: Home / Self Care | Payer: PPO | Attending: Emergency Medicine | Admitting: Emergency Medicine

## 2014-04-17 DIAGNOSIS — F79 Unspecified intellectual disabilities: Secondary | ICD-10-CM | POA: Diagnosis not present

## 2014-04-17 DIAGNOSIS — Z9889 Other specified postprocedural states: Secondary | ICD-10-CM | POA: Diagnosis not present

## 2014-04-17 DIAGNOSIS — Z791 Long term (current) use of non-steroidal anti-inflammatories (NSAID): Secondary | ICD-10-CM | POA: Insufficient documentation

## 2014-04-17 DIAGNOSIS — M545 Low back pain, unspecified: Secondary | ICD-10-CM

## 2014-04-17 DIAGNOSIS — Z8669 Personal history of other diseases of the nervous system and sense organs: Secondary | ICD-10-CM | POA: Insufficient documentation

## 2014-04-17 DIAGNOSIS — Z79899 Other long term (current) drug therapy: Secondary | ICD-10-CM | POA: Insufficient documentation

## 2014-04-17 DIAGNOSIS — I10 Essential (primary) hypertension: Secondary | ICD-10-CM | POA: Insufficient documentation

## 2014-04-17 DIAGNOSIS — Z8619 Personal history of other infectious and parasitic diseases: Secondary | ICD-10-CM | POA: Insufficient documentation

## 2014-04-17 MED ORDER — TRAMADOL HCL 50 MG PO TABS: 50.0000 mg | ORAL_TABLET | Freq: Three times a day (TID) | ORAL | Status: DC | PRN

## 2014-04-17 MED ORDER — KETOROLAC TROMETHAMINE 60 MG/2ML IM SOLN
60.0000 mg | Freq: Once | INTRAMUSCULAR | Status: AC
  Administered 2014-04-17: 60 mg via INTRAMUSCULAR
  Filled 2014-04-17: qty 2

## 2014-04-17 MED ORDER — TRAMADOL HCL 50 MG PO TABS: 50.0000 mg | ORAL_TABLET | Freq: Four times a day (QID) | ORAL | Status: AC | PRN

## 2014-04-17 MED ORDER — TRAMADOL HCL 50 MG PO TABS: 50.0000 mg | ORAL_TABLET | Freq: Four times a day (QID) | ORAL | Status: DC | PRN

## 2014-04-17 NOTE — Discharge Instructions (Signed)
It is important for you to follow-up with your primary care for further evaluation and management of your symptoms. Please take your pain medicine as directed for any discomfort he may experience. It is also important for you to continue taking anti-inflammatories. If you continue to take your relief, please do not exceed 1000 mg in 1 day. Return to ED for new or worsening symptoms.

## 2014-04-17 NOTE — ED Provider Notes (Signed)
CSN: [id number]     Arrival date & time 04/17/14  4098 History   This chart was scribed for non-physician practitioner Joycie Peek, working with No att. providers found by Carl Best, ED Scribe. This patient was seen in room TR05C/TR05C and the patient's care was started at 10:13 AM.   Chief Complaint  Patient presents with  . Back Pain    Patient is a 44 y.o. male presenting with back pain. The history is provided by the patient. No language interpreter was used.  Back Pain Associated symptoms: no fever and no weakness    HPI Comments: Lance Luna is a 44 y.o. male who presents to the Emergency Department complaining of constant, squeezing, gradually worsening lower back pain with associated tingling in his left foot that started a week ago when he bent down to pick a car battery off the ground.  He rates his pain a 9/10 currently.  He states that the tingling in his foot does not occur constantly.  He has a PCP at [facility] however he has not spoken to her about his symptoms.  He has taken four Aleve with no relief to his symptoms.  He has a history of back and neck surgeries.  He denies fever and weakness as associated symptoms.  He denies having a history IV drug use, steroid use, or cancer.  Past Medical History  Diagnosis Date  . H. pylori infection 10/2001  . Carpal tunnel syndrome of left wrist   . Muscle strain     Multiple  . Mental retardation   . Hypertension   . Chronic headache   . Obstructive sleep apnea    Past Surgical History  Procedure Laterality Date  . Cervical disc surgery  2003  . Esophagogastroduodenoscopy  2003    No ulcers  . Appendectomy  2002  . Lumbar laminectomy  12/2002    L3-4, L4-5  . Carpal tunnel release     Family History  Problem Relation Age of Onset  . Migraines Mother   . Heart disease Paternal Grandfather   . Hypertension Paternal Grandmother   . Stroke Paternal Grandfather   . Lung cancer Paternal Grandmother     History  Substance Use Topics  . Smoking status: Never Smoker   . Smokeless tobacco: Never Used  . Alcohol Use: No    Review of Systems  Constitutional: Negative for fever.  Musculoskeletal: Positive for back pain and gait problem.  Neurological: Negative for weakness.  All other systems reviewed and are negative.     Allergies  Review of patient's allergies indicates no known allergies.  Home Medications   Prior to Admission medications   Medication Sig Start Date End Date Taking? Authorizing Provider  albuterol (PROVENTIL HFA;VENTOLIN HFA) 108 (90 BASE) MCG/ACT inhaler Inhale 2 puffs into the lungs every 2 (two) hours as needed for wheezing or shortness of breath (cough). 12/29/12   Mora Bellman, PA-C  ARIPiprazole (ABILIFY) 10 MG tablet Take 10 mg by mouth daily. 02/24/11   Reginold Agent, MD  cyclobenzaprine (FLEXERIL) 10 MG tablet Take 1 tablet (10 mg total) by mouth 2 (two) times daily as needed for muscle spasms. 03/30/13   Amber Nydia Bouton, MD  FLUoxetine (PROZAC) 40 MG capsule Take 40 mg by mouth daily.     Monica Becton, MD  gabapentin (NEURONTIN) 600 MG tablet Take 2 tablets (1,200 mg total) by mouth 3 (three) times daily. 05/19/12 06/10/21  Amber Nydia Bouton, MD  lisinopril-hydrochlorothiazide (PRINZIDE,ZESTORETIC) 20-25 MG per tablet Take 1 tablet by mouth daily. 05/19/12   Amber Nydia BoutonM Hairford, MD  meloxicam (MOBIC) 15 MG tablet Take 1 tablet (15 mg total) by mouth daily. 03/30/13   Amber Nydia BoutonM Hairford, MD  omeprazole (PRILOSEC) 40 MG capsule Take 1 capsule (40 mg total) by mouth daily. Patient not taking: Reported on 03/03/2014 03/30/13   Hilarie FredricksonAmber M Hairford, MD  traMADol (ULTRAM) 50 MG tablet Take 1 tablet (50 mg total) by mouth every 6 (six) hours as needed. 04/17/14   Sharlene MottsBenjamin W Fishel Wamble, PA-C  traZODone (DESYREL) 100 MG tablet Take 200 mg by mouth at bedtime.     Historical Provider, MD   Triage Vitals: BP 155/80 mmHg  Pulse 71  Temp(Src) 97.3 F (36.3 C) (Oral)   Resp 20  SpO2 97%  Physical Exam  Constitutional: He is oriented to person, place, and time. He appears well-developed and well-nourished.  HENT:  Head: Normocephalic and atraumatic.  Mouth/Throat: Oropharynx is clear and moist.  Eyes: Conjunctivae are normal. Pupils are equal, round, and reactive to light. Right eye exhibits no discharge. Left eye exhibits no discharge. No scleral icterus.  Neck: Neck supple.  Cardiovascular: Normal rate, regular rhythm and normal heart sounds.  Exam reveals no gallop and no friction rub.   No murmur heard. Pulmonary/Chest: Effort normal and breath sounds normal. No respiratory distress. He has no wheezes. He has no rales.  Abdominal: Soft. There is no tenderness.  Musculoskeletal: He exhibits tenderness.  Diffuse tenderness to left paraspinal muscles in lumbar region.  No crepitus or step offs.  No obvious lesions or deformities.    Neurological: He is alert and oriented to person, place, and time.  Moves all four extremities without ataxia.  Gait is somewhat antalgic but without any apparent ataxia.  Motor and sensation appear grossly intact in all 4 extremities. Patient reports decreased sensation in left foot, however flinches with pinprick evaluation.  Skin: Skin is warm and dry. No rash noted.  Psychiatric: He has a normal mood and affect.  Nursing note and vitals reviewed.   ED Course  Procedures (including critical care time)  DIAGNOSTIC STUDIES: Oxygen Saturation is 97% on room air, adequate by my interpretation.    COORDINATION OF CARE: 10:19 AM- Will discharge patient with a prescription for antiinflammatories.  The patient agreed to the treatment plan.   Labs Review Labs Reviewed - No data to display  Imaging Review No results found.   EKG Interpretation None     Meds given in ED:  Medications  ketorolac (TORADOL) injection 60 mg (60 mg Intramuscular Given 04/17/14 1028)    Discharge Medication List as of 04/17/2014 10:52  AM     Filed Vitals:   04/17/14 0959 04/17/14 1109  BP: 155/80 136/86  Pulse: 71 70  Temp: 97.3 F (36.3 C) 98 F (36.7 C)  TempSrc: Oral Oral  Resp: 20 22  SpO2: 97% 99%    MDM  Vitals stable - WNL -afebrile Pt resting comfortably in ED. no apparent distress PE-patient reports left foot numbness secondary to his pain, but flinches with sharp sensation on neuro exam. Doubt numbness. Physical exam is otherwise unremarkable  DDX-patient likely experiencing lumbar strain. Will DC with tramadol for breakthrough pain. Patient reports she will follow-up with his PCP this week. I discussed all relevant lab findings and imaging results with pt and they verbalized understanding. Discussed f/u with PCP within 48 hrs and return precautions, pt very amenable to plan. Poor effort  on physical exam. Final diagnoses:  Left-sided low back pain without sciatica    I personally performed the services described in this documentation, which was scribed in my presence. The recorded information has been reviewed and is accurate.    Earle Gell [place], PA-C 04/17/14 1821  Hilario Quarry, MD 04/18/14 1320

## 2014-04-17 NOTE — ED Notes (Signed)
Pt has hx of back and neck surgeries per Dr. Wynetta Emeryram in 2005. States he started having left lower back pain 1 week ago with radiation to left leg and numbness to bottom of foot.

## 2014-04-18 ENCOUNTER — Ambulatory Visit (INDEPENDENT_AMBULATORY_CARE_PROVIDER_SITE_OTHER): Payer: PPO | Admitting: Internal Medicine

## 2014-04-18 ENCOUNTER — Ambulatory Visit (HOSPITAL_COMMUNITY)
Admission: RE | Admit: 2014-04-18 | Discharge: 2014-04-18 | Disposition: A | Discharge status: Home / Self Care | Payer: PPO | Source: Ambulatory Visit | Attending: Internal Medicine | Admitting: Internal Medicine

## 2014-04-18 ENCOUNTER — Encounter: Payer: Self-pay | Admitting: Internal Medicine

## 2014-04-18 VITALS — BP 120/88 | HR 66 | Temp 97.8°F | Resp 18 | Ht 72.0 in | Wt 341.0 lb

## 2014-04-18 DIAGNOSIS — M5442 Lumbago with sciatica, left side: Secondary | ICD-10-CM

## 2014-04-18 DIAGNOSIS — M545 Low back pain: Secondary | ICD-10-CM | POA: Diagnosis present

## 2014-04-18 MED ORDER — LISINOPRIL-HYDROCHLOROTHIAZIDE 20-25 MG PO TABS: 1.0000 | ORAL_TABLET | Freq: Every day | ORAL | Status: AC

## 2014-04-18 MED ORDER — CYCLOBENZAPRINE HCL 10 MG PO TABS: 10.0000 mg | ORAL_TABLET | Freq: Two times a day (BID) | ORAL | Status: AC | PRN

## 2014-04-18 NOTE — Progress Notes (Signed)
   Subjective:    Patient ID: Lance Luna, male    DOB: 07/28/1970, 44 y.o.   MRN: [id number]  HPI The patient is a 44 YO man who is coming in acutely for back pain. He has had back surgeries in the past but not struggled with much chronic pain. He was bending to pick up something heavy about 1 week ago and the pain started. It has been 9-10/10 pain since that time. He went to the ER but feels they did not do anything for it. He has tried tramadol and it is not helping at all. He tried heat on it which helped some but not much.   Review of Systems  Constitutional: Negative for fever, chills, activity change, appetite change, fatigue and unexpected weight change.  HENT: Negative.   Respiratory: Negative for cough, chest tightness, shortness of breath and wheezing.   Cardiovascular: Negative for chest pain, palpitations and leg swelling.  Gastrointestinal: Negative for nausea, abdominal pain, constipation, blood in stool and abdominal distention.  Musculoskeletal: Positive for back pain. Negative for myalgias, arthralgias and gait problem.  Skin: Negative.   Neurological: Negative for dizziness, weakness, light-headedness and headaches.      Objective:   Physical Exam  Constitutional: He is oriented to person, place, and time. He appears well-developed and well-nourished.  Morbidly obese  HENT:  Head: Normocephalic and atraumatic.  Eyes: EOM are normal.  Cardiovascular: Normal rate and regular rhythm.   No murmur heard. Pulmonary/Chest: Effort normal and breath sounds normal. No respiratory distress. He has no wheezes. He has no rales.  Abdominal: Soft. Bowel sounds are normal. He exhibits no distension. There is no tenderness. There is no rebound.  Musculoskeletal:  Tenderness over the lumbar spine and left paraspinal lumbar muscles.   Neurological: He is alert and oriented to person, place, and time. Coordination normal.  Skin: Skin is warm and dry.  Vitals reviewed.  Filed  Vitals:   04/18/14 0806  BP: 120/88  Pulse: 66  Temp: 97.8 F (36.6 C)  TempSrc: Oral  Resp: 18  Height: 6' (1.829 m)  Weight: 341 lb (154.677 kg)  SpO2: 99%      Assessment & Plan:

## 2014-04-18 NOTE — Assessment & Plan Note (Signed)
Check lumbar x-ray given his past surgical history and midline tenderness. Will refill flexeril and have him see back Dr. Wynetta Emeryram (who did his back surgery). Also talked to him about weight loss as he is up 10 pounds since our last visit. Talked to him about the fact that this alone could be causing his back to hurt and that we need to work on weight loss or at least no weight gain.

## 2014-04-18 NOTE — Progress Notes (Signed)
Pre visit review using our clinic review tool, if applicable. No additional management support is needed unless otherwise documented below in the visit note. 

## 2014-04-18 NOTE — Patient Instructions (Addendum)
We will send you to the back doctor, Dr. Wynetta Emeryram to have him look at the back.   We are going to check the x-rays today. We will call you back about the results.   For the pain we will have you try the flexeril that you have at home to see if this helps. You can also use the tramadols (you can take 2 pills at a time if needed).   Keep taking your gabapentin as it will help the back pain if that is the cause.   Using heat on the area will help the muscles to relax and help it to feel better.   Your weight is up about 10 pounds since last visit and this can be hurting the back and making it have more pressure and weight on it. We recommend trying to lose weight to help your back feel better.

## 2014-04-20 ENCOUNTER — Ambulatory Visit (INDEPENDENT_AMBULATORY_CARE_PROVIDER_SITE_OTHER): Payer: PPO | Admitting: Psychiatry

## 2014-04-20 DIAGNOSIS — F209 Schizophrenia, unspecified: Secondary | ICD-10-CM | POA: Diagnosis not present

## 2014-04-24 ENCOUNTER — Telehealth: Payer: Self-pay | Admitting: Internal Medicine

## 2014-04-24 DIAGNOSIS — M5440 Lumbago with sciatica, unspecified side: Secondary | ICD-10-CM

## 2014-04-24 NOTE — Telephone Encounter (Signed)
Patient states he is waiting on a call back from Dr. Dorise HissKollar regarding his referral to Dr. Wynetta Emeryram. He states he does not want to go back to him and would like to see someone else. Who do you recommend?

## 2014-04-27 ENCOUNTER — Ambulatory Visit: Payer: PPO | Admitting: Internal Medicine

## 2014-04-27 ENCOUNTER — Ambulatory Visit (INDEPENDENT_AMBULATORY_CARE_PROVIDER_SITE_OTHER): Payer: PPO | Admitting: Psychiatry

## 2014-04-27 DIAGNOSIS — F209 Schizophrenia, unspecified: Secondary | ICD-10-CM

## 2014-05-02 ENCOUNTER — Encounter: Payer: Self-pay | Admitting: Internal Medicine

## 2014-05-02 ENCOUNTER — Ambulatory Visit (INDEPENDENT_AMBULATORY_CARE_PROVIDER_SITE_OTHER): Payer: PPO | Admitting: Internal Medicine

## 2014-05-02 VITALS — BP 134/88 | HR 74 | Temp 97.9°F | Resp 18 | Wt 338.0 lb

## 2014-05-02 DIAGNOSIS — M545 Low back pain, unspecified: Secondary | ICD-10-CM

## 2014-05-02 DIAGNOSIS — H8110 Benign paroxysmal vertigo, unspecified ear: Secondary | ICD-10-CM | POA: Insufficient documentation

## 2014-05-02 DIAGNOSIS — H8113 Benign paroxysmal vertigo, bilateral: Secondary | ICD-10-CM

## 2014-05-02 NOTE — Progress Notes (Signed)
Pre visit review using our clinic review tool, if applicable. No additional management support is needed unless otherwise documented below in the visit note. 

## 2014-05-02 NOTE — Assessment & Plan Note (Signed)
He can continue flexeril, mobic, tramadol for the pain. He does not wish to go back to Dr. Wynetta Emeryram and arranging follow up with another provider. No red flag signs.

## 2014-05-02 NOTE — Progress Notes (Signed)
   Subjective:    Patient ID: Lance Luna, male    DOB: 08/11/1970, 44 y.o.   MRN: [id number]  HPI The patient is a 44 YO man who is coming in to follow up on his back pain. He has not been back to Dr. Wynetta Emeryram but thinks that he is not doing anything and wishes to go to someone else. He is still struggling with 5/10 pain most days. He is using tramadol and mobic which take the pain to manageable. He is still able to take his dog to the dog park for walks. Denies new numbness or weakness in his legs. Has not been stretching or doing any back exercises.   He also has a new problem of vertigo. Has had in the past once before but more severe. He has had it for 3 days, tried meclizine which was partially effective. Has had some ear fullness and ringing in his left ear yesterday. The vertigo seems to be coming less often now. Denies falls or other illness. Denies flulike symptoms.   Review of Systems  Constitutional: Negative for fever, chills, activity change, appetite change, fatigue and unexpected weight change.  HENT: Positive for ear discharge and ear pain. Negative for congestion, postnasal drip and rhinorrhea.   Respiratory: Negative for cough, chest tightness, shortness of breath and wheezing.   Cardiovascular: Negative for chest pain, palpitations and leg swelling.  Gastrointestinal: Negative for nausea, abdominal pain, constipation, blood in stool and abdominal distention.  Musculoskeletal: Positive for back pain. Negative for myalgias, arthralgias and gait problem.  Skin: Negative.   Neurological: Positive for dizziness. Negative for weakness, light-headedness and headaches.      Objective:   Physical Exam  Constitutional: He is oriented to person, place, and time. He appears well-developed and well-nourished.  Morbidly obese  HENT:  Head: Normocephalic and atraumatic.  Left Ear: External ear normal.  Right ear blocked with wax, unable to visualize TM. Left TM normal  Eyes: EOM are  normal.  Cardiovascular: Normal rate and regular rhythm.   No murmur heard. Pulmonary/Chest: Effort normal and breath sounds normal. No respiratory distress. He has no wheezes. He has no rales.  Abdominal: Soft. Bowel sounds are normal. He exhibits no distension. There is no tenderness. There is no rebound.  Musculoskeletal:  Tenderness over the lumbar spine and left paraspinal lumbar muscles.   Neurological: He is alert and oriented to person, place, and time. Coordination normal.  Skin: Skin is warm and dry.  Vitals reviewed.  Filed Vitals:   05/02/14 0904  BP: 134/88  Pulse: 74  Temp: 97.9 F (36.6 C)  TempSrc: Oral  Resp: 18  Weight: 338 lb (153.316 kg)  SpO2: 97%      Assessment & Plan:

## 2014-05-02 NOTE — Assessment & Plan Note (Signed)
Has lost 3 pounds since earlier this month but overall (last 6 months) he is up weight. Talked to him about how this is likely making his back pain worse and that it will give him problems with his heart and body at some point in the future. He will work on exercising more with his dog.

## 2014-05-02 NOTE — Assessment & Plan Note (Signed)
Left ear normal, right ear obstructed with wax and unable to see TM. Offered ear lavage but he declined. He can use meclizine prn and if continued problems he will call us back.

## 2014-05-02 NOTE — Patient Instructions (Signed)
We are going to make sure that they call you about another back doctor.   Your ears are full of wax and you can try to clean them out yourself. This could be causing some of the dizziness.   Your blood pressure is doing well. You still need to be working on losing weight as this will cause you some health problems in the future.   Use ice on the wrist to see if this helps with the pain.  Come back in about 6 months.  Exercise to Lose Weight Exercise and a healthy diet may help you lose weight. Your doctor may suggest specific exercises. EXERCISE IDEAS AND TIPS  Choose low-cost things you enjoy doing, such as walking, bicycling, or exercising to workout videos.  Take stairs instead of the elevator.  Walk during your lunch break.  Park your car further away from work or school.  Go to a gym or an exercise class.  Start with 5 to 10 minutes of exercise each day. Build up to 30 minutes of exercise 4 to 6 days a week.  Wear shoes with good support and comfortable clothes.  Stretch before and after working out.  Work out until you breathe harder and your heart beats faster.  Drink extra water when you exercise.  Do not do so much that you hurt yourself, feel dizzy, or get very short of breath. Exercises that burn about 150 calories:  Running 1  miles in 15 minutes.  Playing volleyball for 45 to 60 minutes.  Washing and waxing a car for 45 to 60 minutes.  Playing touch football for 45 minutes.  Walking 1  miles in 35 minutes.  Pushing a stroller 1  miles in 30 minutes.  Playing basketball for 30 minutes.  Raking leaves for 30 minutes.  Bicycling 5 miles in 30 minutes.  Walking 2 miles in 30 minutes.  Dancing for 30 minutes.  Shoveling snow for 15 minutes.  Swimming laps for 20 minutes.  Walking up stairs for 15 minutes.  Bicycling 4 miles in 15 minutes.  Gardening for 30 to 45 minutes.  Jumping rope for 15 minutes.  Washing windows or floors for 45  to 60 minutes. Document Released: 03/08/2010 Document Revised: 04/28/2011 Document Reviewed: 03/08/2010 [facility] Patient Information 2015 [employer] [employer], [place]. This information is not intended to replace advice given to you by your health care provider. Make sure you discuss any questions you have with your health care provider.

## 2014-05-04 ENCOUNTER — Ambulatory Visit: Payer: PPO | Admitting: Psychiatry

## 2014-05-08 ENCOUNTER — Telehealth: Payer: Self-pay | Admitting: Internal Medicine

## 2014-05-08 NOTE — Telephone Encounter (Signed)
No, he can not increase the dose and I would recommend he try not taking it if possible as the medicine itself can cause dizziness. If this does not work he can come back in for acute visit.

## 2014-05-08 NOTE — Telephone Encounter (Signed)
Pt called in would like a call back from the nurse about his meds  Best number [phone]

## 2014-05-08 NOTE — Telephone Encounter (Signed)
Patient will stop meclizine and call us back if he does not feel better.

## 2014-05-08 NOTE — Telephone Encounter (Signed)
Patient says he is taking Meclizine 25mg  3 times daily and he is still dizzy. I didn't see that medication on his med list. He wants to know if he can increase the dose, please advise thanks.

## 2014-05-11 ENCOUNTER — Ambulatory Visit (INDEPENDENT_AMBULATORY_CARE_PROVIDER_SITE_OTHER): Payer: PPO | Admitting: Psychiatry

## 2014-05-11 DIAGNOSIS — F209 Schizophrenia, unspecified: Secondary | ICD-10-CM

## 2014-05-25 ENCOUNTER — Ambulatory Visit (INDEPENDENT_AMBULATORY_CARE_PROVIDER_SITE_OTHER): Payer: PPO | Admitting: Psychiatry

## 2014-05-25 DIAGNOSIS — F209 Schizophrenia, unspecified: Secondary | ICD-10-CM

## 2014-05-29 ENCOUNTER — Encounter: Payer: Self-pay | Admitting: Internal Medicine

## 2014-05-29 ENCOUNTER — Ambulatory Visit: Payer: Self-pay | Admitting: Internal Medicine

## 2014-05-29 ENCOUNTER — Ambulatory Visit (INDEPENDENT_AMBULATORY_CARE_PROVIDER_SITE_OTHER): Payer: PPO | Admitting: Internal Medicine

## 2014-05-29 ENCOUNTER — Ambulatory Visit (INDEPENDENT_AMBULATORY_CARE_PROVIDER_SITE_OTHER)
Admission: RE | Admit: 2014-05-29 | Discharge: 2014-05-29 | Disposition: A | Discharge status: Home / Self Care | Payer: PPO | Source: Ambulatory Visit | Attending: Internal Medicine | Admitting: Internal Medicine

## 2014-05-29 VITALS — BP 120/72 | HR 85 | Ht 72.0 in | Wt 335.0 lb

## 2014-05-29 DIAGNOSIS — R0789 Other chest pain: Secondary | ICD-10-CM | POA: Diagnosis not present

## 2014-05-29 DIAGNOSIS — I10 Essential (primary) hypertension: Secondary | ICD-10-CM

## 2014-05-29 DIAGNOSIS — G4730 Sleep apnea, unspecified: Secondary | ICD-10-CM

## 2014-05-29 DIAGNOSIS — E789 Disorder of lipoprotein metabolism, unspecified: Secondary | ICD-10-CM

## 2014-05-29 DIAGNOSIS — Z0000 Encounter for general adult medical examination without abnormal findings: Secondary | ICD-10-CM | POA: Insufficient documentation

## 2014-05-29 DIAGNOSIS — M545 Low back pain, unspecified: Secondary | ICD-10-CM

## 2014-05-29 NOTE — Assessment & Plan Note (Signed)
Last checked in September and doing okay. Will recheck in about 6 months. Talked to him about the importance of decreasing fat in his diet if possible.

## 2014-05-29 NOTE — Progress Notes (Signed)
Pre visit review using our clinic review tool, if applicable. No additional management support is needed unless otherwise documented below in the visit note. 

## 2014-05-29 NOTE — Assessment & Plan Note (Signed)
Checking EKG today as unclear when the last time he saw Dr. Sharyn LullHarwani was and has not had EKG in 1.5 years. No worsening of the discomfort when he is walking or exerting himself.

## 2014-05-29 NOTE — Patient Instructions (Signed)
We have checked the EKG of your heart which does not look any different from the last time we checked it.   We will check on the x-ray of the neck to make sure that the screws are still in place.   We will see you back in about 3-4 months to check on the weight. All the extra weight in your stomach can put extra strain on the back and can cause back pains to be worse. Losing weight takes some stress off the back and can help you to feel better.   Calorie Counting for Weight Loss Calories are energy you get from the things you eat and drink. Your body uses this energy to keep you going throughout the day. The number of calories you eat affects your weight. When you eat more calories than your body needs, your body stores the extra calories as fat. When you eat fewer calories than your body needs, your body burns fat to get the energy it needs. Calorie counting means keeping track of how many calories you eat and drink each day. If you make sure to eat fewer calories than your body needs, you should lose weight. In order for calorie counting to work, you will need to eat the number of calories that are right for you in a day to lose a healthy amount of weight per week. A healthy amount of weight to lose per week is usually 1-2 lb (0.5-0.9 kg). A dietitian can determine how many calories you need in a day and give you suggestions on how to reach your calorie goal.  WHAT IS MY MY PLAN? My goal is to have __1800________ calories per day.  If I have this many calories per day, I should lose around __________ pounds per week. WHAT DO I NEED TO KNOW ABOUT CALORIE COUNTING? In order to meet your daily calorie goal, you will need to:  Find out how many calories are in each food you would like to eat. Try to do this before you eat.  Decide how much of the food you can eat.  Write down what you ate and how many calories it had. Doing this is called keeping a food log. WHERE DO I FIND CALORIE  INFORMATION? The number of calories in a food can be found on a Nutrition Facts label. Note that all the information on a label is based on a specific serving of the food. If a food does not have a Nutrition Facts label, try to look up the calories online or ask your dietitian for help. HOW DO I DECIDE HOW MUCH TO EAT? To decide how much of the food you can eat, you will need to consider both the number of calories in one serving and the size of one serving. This information can be found on the Nutrition Facts label. If a food does not have a Nutrition Facts label, look up the information online or ask your dietitian for help. Remember that calories are listed per serving. If you choose to have more than one serving of a food, you will have to multiply the calories per serving by the amount of servings you plan to eat. For example, the label on a package of bread might say that a serving size is 1 slice and that there are 90 calories in a serving. If you eat 1 slice, you will have eaten 90 calories. If you eat 2 slices, you will have eaten 180 calories. HOW DO I KEEP A FOOD  LOG? After each meal, record the following information in your food log:  What you ate.  How much of it you ate.  How many calories it had.  Then, add up your calories. Keep your food log near you, such as in a small notebook in your pocket. Another option is to use a mobile app or website. Some programs will calculate calories for you and show you how many calories you have left each time you add an item to the log. WHAT ARE SOME CALORIE COUNTING TIPS?  Use your calories on foods and drinks that will fill you up and not leave you hungry. Some examples of this include foods like nuts and nut butters, vegetables, lean proteins, and high-fiber foods (more than 5 g fiber per serving).  Eat nutritious foods and avoid empty calories. Empty calories are calories you get from foods or beverages that do not have many nutrients, such  as candy and soda. It is better to have a nutritious high-calorie food (such as an avocado) than a food with few nutrients (such as a bag of chips).  Know how many calories are in the foods you eat most often. This way, you do not have to look up how many calories they have each time you eat them.  Look out for foods that may seem like low-calorie foods but are really high-calorie foods, such as baked goods, soda, and fat-free candy.  Pay attention to calories in drinks. Drinks such as sodas, specialty coffee drinks, alcohol, and juices have a lot of calories yet do not fill you up. Choose low-calorie drinks like water and diet drinks.  Focus your calorie counting efforts on higher calorie items. Logging the calories in a garden salad that contains only vegetables is less important than calculating the calories in a milk shake.  Find a way of tracking calories that works for you. Get creative. Most people who are successful find ways to keep track of how much they eat in a day, even if they do not count every calorie. WHAT ARE SOME PORTION CONTROL TIPS?  Know how many calories are in a serving. This will help you know how many servings of a certain food you can have.  Use a measuring cup to measure serving sizes. This is helpful when you start out. With time, you will be able to estimate serving sizes for some foods.  Take some time to put servings of different foods on your favorite plates, bowls, and cups so you know what a serving looks like.  Try not to eat straight from a bag or box. Doing this can lead to overeating. Put the amount you would like to eat in a cup or on a plate to make sure you are eating the right portion.  Use smaller plates, glasses, and bowls to prevent overeating. This is a quick and easy way to practice portion control. If your plate is smaller, less food can fit on it.  Try not to multitask while eating, such as watching TV or using your computer. If it is time to  eat, sit down at a table and enjoy your food. Doing this will help you to start recognizing when you are full. It will also make you more aware of what and how much you are eating. HOW CAN I CALORIE COUNT WHEN EATING OUT?  Ask for smaller portion sizes or child-sized portions.  Consider sharing an entree and sides instead of getting your own entree.  If you get  your own entree, eat only half. Ask for a box at the beginning of your meal and put the rest of your entree in it so you are not tempted to eat it.  Look for the calories on the menu. If calories are listed, choose the lower calorie options.  Choose dishes that include vegetables, fruits, whole grains, low-fat dairy products, and lean protein. Focusing on smart food choices from each of the 5 food groups can help you stay on track at restaurants.  Choose items that are boiled, broiled, grilled, or steamed.  Choose water, milk, unsweetened iced tea, or other drinks without added sugars. If you want an alcoholic beverage, choose a lower calorie option. For example, a regular margarita can have up to 700 calories and a glass of wine has around 150.  Stay away from items that are buttered, battered, fried, or served with cream sauce. Items labeled "crispy" are usually fried, unless stated otherwise.  Ask for dressings, sauces, and syrups on the side. These are usually very high in calories, so do not eat much of them.  Watch out for salads. Many people think salads are a healthy option, but this is often not the case. Many salads come with bacon, fried chicken, lots of cheese, fried chips, and dressing. All of these items have a lot of calories. If you want a salad, choose a garden salad and ask for grilled meats or steak. Ask for the dressing on the side, or ask for olive oil and vinegar or lemon to use as dressing.  Estimate how many servings of a food you are given. For example, a serving of cooked rice is  cup or about the size of half  a tennis ball or one cupcake wrapper. Knowing serving sizes will help you be aware of how much food you are eating at restaurants. The list below tells you how big or small some common portion sizes are based on everyday objects.  1 oz--4 stacked dice.  3 oz--1 deck of cards.  1 tsp--1 dice.  1 Tbsp-- a Ping-Pong ball.  2 Tbsp--1 Ping-Pong ball.   cup--1 tennis ball or 1 cupcake wrapper.  1 cup--1 baseball. Document Released: 02/03/2005 Document Revised: 06/20/2013 Document Reviewed: 12/09/2012 [facility] Patient Information 2015 [place], [place]. This information is not intended to replace advice given to you by your health care provider. Make sure you discuss any questions you have with your health care provider.

## 2014-05-29 NOTE — Assessment & Plan Note (Signed)
BP controlled today on lisinopril/hctz. BMP recently checked without dosage change so will not recheck today.

## 2014-05-29 NOTE — Progress Notes (Signed)
   Subjective:    Patient ID: Lance Luna, male    DOB: 03/07/1970, 44 y.o.   MRN: [id number]  HPI The patient is a 44 YO man who is coming in for wellness. He is still struggling with his back pains and neck from prior surgery sites. We are working on getting him in with neurosurgeon. Please see A/P for status and treatment of other chronic problems. No new complaints including chest pains, SOB, abdominal pain, hearing loss, vision change, new skin lesions.   [facility], [facility], social history reviewed and updated during today's visit.   Review of Systems  Constitutional: Negative for fever, chills, activity change, appetite change, fatigue and unexpected weight change.  HENT: Negative for congestion, postnasal drip and rhinorrhea.   Respiratory: Negative for cough, chest tightness, shortness of breath and wheezing.   Cardiovascular: Negative for chest pain, palpitations and leg swelling.  Gastrointestinal: Negative for nausea, abdominal pain, constipation, blood in stool and abdominal distention.  Musculoskeletal: Positive for back pain and neck pain. Negative for myalgias, arthralgias and gait problem.  Skin: Negative.   Neurological: Negative for dizziness, weakness, light-headedness and headaches.     Objective:   Physical Exam  Constitutional: He is oriented to person, place, and time. He appears well-developed and well-nourished.  Morbidly obese  HENT:  Head: Normocephalic and atraumatic.  Eyes: EOM are normal.  Neck: Normal range of motion.  Cardiovascular: Normal rate and regular rhythm.   No murmur heard. Pulmonary/Chest: Effort normal and breath sounds normal. No respiratory distress. He has no wheezes. He has no rales.  Abdominal: Soft. Bowel sounds are normal. He exhibits no distension. There is no tenderness. There is no rebound.  Musculoskeletal:  Tenderness over the lumbar spine and left paraspinal lumbar muscles. Mild tenderness in the cervical paraspinal muscles.     Lymphadenopathy:    He has no cervical adenopathy.  Neurological: He is alert and oriented to person, place, and time. Coordination normal.  Skin: Skin is warm and dry.  Vitals reviewed.  Filed Vitals:   05/29/14 0804  BP: 120/72  Pulse: 85  Height: 6' (1.829 m)  Weight: 335 lb (151.955 kg)  SpO2: 95%   EKG: Rate 66, axis okay, intervals normal, no st or t wave changes. No change from prior, sinus rythmn    Assessment & Plan:

## 2014-05-29 NOTE — Assessment & Plan Note (Signed)
Tdap and flu up to date. Too young for colon cancer screening. Talked with him about sun safety and he does not regularly use sun screen. Encouraged him strongly to use sun screen with sun exposure to decrease risk of skin cancer in the future. He will work on that. Also talked to him about covering his head and hats for facial shading. He is exercising more and non-smoker.

## 2014-05-29 NOTE — Assessment & Plan Note (Signed)
Still working to get him into [facility]. Last x-ray showed hardware in good place and no changes.

## 2014-05-29 NOTE — Assessment & Plan Note (Signed)
Has lost several pounds since last visit and has been walking his dog more. Encouraged him to keep up with this. Overall his diet is poor or mediocre at best. He does have to make his own meals and is not able to cook very well so ends up eating pre-prepared foods. Talked to him about the fact that the weight is likely hurting his back and making it hurt worse. This has helped to motivate him some to continue working out.

## 2014-05-29 NOTE — Assessment & Plan Note (Signed)
Still wearing every night and does well with this.

## 2014-06-08 ENCOUNTER — Ambulatory Visit (INDEPENDENT_AMBULATORY_CARE_PROVIDER_SITE_OTHER): Payer: PPO | Admitting: Psychiatry

## 2014-06-08 DIAGNOSIS — F209 Schizophrenia, unspecified: Secondary | ICD-10-CM

## 2014-06-22 ENCOUNTER — Ambulatory Visit (INDEPENDENT_AMBULATORY_CARE_PROVIDER_SITE_OTHER): Payer: PPO | Admitting: Psychiatry

## 2014-06-22 DIAGNOSIS — F209 Schizophrenia, unspecified: Secondary | ICD-10-CM

## 2014-06-23 ENCOUNTER — Telehealth: Payer: Self-pay | Admitting: Internal Medicine

## 2014-06-23 ENCOUNTER — Other Ambulatory Visit: Payer: Self-pay | Admitting: Neurosurgery

## 2014-06-23 ENCOUNTER — Other Ambulatory Visit: Payer: Self-pay | Admitting: Geriatric Medicine

## 2014-06-23 DIAGNOSIS — M542 Cervicalgia: Secondary | ICD-10-CM

## 2014-06-23 MED ORDER — GABAPENTIN 600 MG PO TABS: 1200.0000 mg | ORAL_TABLET | Freq: Three times a day (TID) | ORAL | Status: AC

## 2014-06-23 MED ORDER — MELOXICAM 15 MG PO TABS: 15.0000 mg | ORAL_TABLET | Freq: Every day | ORAL | Status: AC

## 2014-06-23 NOTE — Telephone Encounter (Signed)
Sent to pharmacy 

## 2014-06-23 NOTE — Telephone Encounter (Signed)
[facility]

## 2014-07-06 ENCOUNTER — Ambulatory Visit (INDEPENDENT_AMBULATORY_CARE_PROVIDER_SITE_OTHER): Payer: PPO | Admitting: Psychiatry

## 2014-07-06 DIAGNOSIS — F209 Schizophrenia, unspecified: Secondary | ICD-10-CM

## 2014-07-13 ENCOUNTER — Ambulatory Visit
Admission: RE | Admit: 2014-07-13 | Discharge: 2014-07-13 | Disposition: A | Discharge status: Home / Self Care | Payer: PPO | Source: Ambulatory Visit | Attending: Neurosurgery | Admitting: Neurosurgery

## 2014-07-13 DIAGNOSIS — M542 Cervicalgia: Secondary | ICD-10-CM

## 2014-08-03 ENCOUNTER — Ambulatory Visit (INDEPENDENT_AMBULATORY_CARE_PROVIDER_SITE_OTHER): Payer: PPO | Admitting: Psychiatry

## 2014-08-03 DIAGNOSIS — F209 Schizophrenia, unspecified: Secondary | ICD-10-CM

## 2014-08-10 ENCOUNTER — Ambulatory Visit: Payer: PPO | Admitting: Psychiatry

## 2014-08-31 ENCOUNTER — Ambulatory Visit: Payer: PPO | Admitting: Internal Medicine

## 2014-08-31 DIAGNOSIS — Z0289 Encounter for other administrative examinations: Secondary | ICD-10-CM

## 2014-09-28 ENCOUNTER — Ambulatory Visit: Payer: PPO | Admitting: Psychiatry

## 2014-10-12 ENCOUNTER — Ambulatory Visit: Payer: PPO | Admitting: Psychiatry

## 2014-10-12 ENCOUNTER — Ambulatory Visit: Payer: Self-pay | Admitting: Internal Medicine

## 2014-10-19 ENCOUNTER — Ambulatory Visit: Payer: Self-pay | Admitting: Internal Medicine

## 2014-10-19 DIAGNOSIS — Z0289 Encounter for other administrative examinations: Secondary | ICD-10-CM

## 2014-10-26 ENCOUNTER — Ambulatory Visit (INDEPENDENT_AMBULATORY_CARE_PROVIDER_SITE_OTHER): Payer: PPO | Admitting: Psychiatry

## 2014-10-26 DIAGNOSIS — F209 Schizophrenia, unspecified: Secondary | ICD-10-CM

## 2014-11-09 ENCOUNTER — Ambulatory Visit (INDEPENDENT_AMBULATORY_CARE_PROVIDER_SITE_OTHER): Payer: PPO | Admitting: Psychiatry

## 2014-11-09 DIAGNOSIS — F209 Schizophrenia, unspecified: Secondary | ICD-10-CM
